# Patient Record
Sex: Female | Born: 1960 | Race: Black or African American | Hispanic: No | Marital: Married | Smoking: Current every day smoker
Health system: Southern US, Community
[De-identification: ages and names within clinical notes are randomized; demographics above are authoritative.]

## PROBLEM LIST (undated history)

## (undated) DIAGNOSIS — E039 Hypothyroidism, unspecified: Secondary | ICD-10-CM

## (undated) DIAGNOSIS — K219 Gastro-esophageal reflux disease without esophagitis: Secondary | ICD-10-CM

## (undated) DIAGNOSIS — T7840XA Allergy, unspecified, initial encounter: Secondary | ICD-10-CM

## (undated) DIAGNOSIS — I10 Essential (primary) hypertension: Secondary | ICD-10-CM

## (undated) DIAGNOSIS — E538 Deficiency of other specified B group vitamins: Secondary | ICD-10-CM

## (undated) DIAGNOSIS — F419 Anxiety disorder, unspecified: Secondary | ICD-10-CM

## (undated) DIAGNOSIS — F32A Depression, unspecified: Secondary | ICD-10-CM

## (undated) DIAGNOSIS — F329 Major depressive disorder, single episode, unspecified: Secondary | ICD-10-CM

## (undated) DIAGNOSIS — N289 Disorder of kidney and ureter, unspecified: Secondary | ICD-10-CM

## (undated) DIAGNOSIS — Z720 Tobacco use: Secondary | ICD-10-CM

## (undated) DIAGNOSIS — E785 Hyperlipidemia, unspecified: Secondary | ICD-10-CM

## (undated) DIAGNOSIS — IMO0002 Reserved for concepts with insufficient information to code with codable children: Secondary | ICD-10-CM

## (undated) DIAGNOSIS — E559 Vitamin D deficiency, unspecified: Secondary | ICD-10-CM

## (undated) HISTORY — DX: Allergy, unspecified, initial encounter: T78.40XA

## (undated) HISTORY — DX: Disorder of kidney and ureter, unspecified: N28.9

## (undated) HISTORY — DX: Gastro-esophageal reflux disease without esophagitis: K21.9

## (undated) HISTORY — DX: Deficiency of other specified B group vitamins: E53.8

## (undated) HISTORY — DX: Essential (primary) hypertension: I10

## (undated) HISTORY — DX: Tobacco use: Z72.0

## (undated) HISTORY — DX: Depression, unspecified: F32.A

## (undated) HISTORY — PX: EYE SURGERY: SHX253

## (undated) HISTORY — DX: Hypothyroidism, unspecified: E03.9

## (undated) HISTORY — DX: Hyperlipidemia, unspecified: E78.5

## (undated) HISTORY — DX: Vitamin D deficiency, unspecified: E55.9

## (undated) HISTORY — DX: Anxiety disorder, unspecified: F41.9

## (undated) HISTORY — DX: Reserved for concepts with insufficient information to code with codable children: IMO0002

## (undated) HISTORY — DX: Major depressive disorder, single episode, unspecified: F32.9

---

## 1999-05-03 ENCOUNTER — Other Ambulatory Visit: Admission: RE | Admit: 1999-05-03 | Discharge: 1999-05-03 | Payer: Self-pay | Admitting: Gynecology

## 1999-12-18 ENCOUNTER — Encounter: Payer: Self-pay | Admitting: Emergency Medicine

## 1999-12-18 ENCOUNTER — Emergency Department (HOSPITAL_COMMUNITY): Admission: EM | Admit: 1999-12-18 | Discharge: 1999-12-18 | Payer: Self-pay | Admitting: Emergency Medicine

## 2000-03-07 ENCOUNTER — Other Ambulatory Visit: Admission: RE | Admit: 2000-03-07 | Discharge: 2000-03-07 | Payer: Self-pay | Admitting: Internal Medicine

## 2001-04-12 ENCOUNTER — Other Ambulatory Visit: Admission: RE | Admit: 2001-04-12 | Discharge: 2001-04-12 | Payer: Self-pay | Admitting: Internal Medicine

## 2002-09-06 ENCOUNTER — Other Ambulatory Visit: Admission: RE | Admit: 2002-09-06 | Discharge: 2002-09-06 | Payer: Self-pay | Admitting: Gynecology

## 2004-01-15 ENCOUNTER — Other Ambulatory Visit: Admission: RE | Admit: 2004-01-15 | Discharge: 2004-01-15 | Payer: Self-pay | Admitting: Gynecology

## 2004-11-09 ENCOUNTER — Ambulatory Visit: Payer: Self-pay | Admitting: Internal Medicine

## 2005-01-17 ENCOUNTER — Other Ambulatory Visit: Admission: RE | Admit: 2005-01-17 | Discharge: 2005-01-17 | Payer: Self-pay | Admitting: Gynecology

## 2005-01-25 ENCOUNTER — Ambulatory Visit: Payer: Self-pay | Admitting: Internal Medicine

## 2005-03-14 ENCOUNTER — Ambulatory Visit: Payer: Self-pay | Admitting: Internal Medicine

## 2005-04-11 ENCOUNTER — Ambulatory Visit: Payer: Self-pay | Admitting: Internal Medicine

## 2005-04-15 ENCOUNTER — Ambulatory Visit: Payer: Self-pay | Admitting: Internal Medicine

## 2005-07-14 ENCOUNTER — Ambulatory Visit: Payer: Self-pay | Admitting: Internal Medicine

## 2005-08-17 ENCOUNTER — Ambulatory Visit: Payer: Self-pay | Admitting: Internal Medicine

## 2005-11-30 ENCOUNTER — Ambulatory Visit: Payer: Self-pay | Admitting: Internal Medicine

## 2006-01-20 ENCOUNTER — Ambulatory Visit (HOSPITAL_COMMUNITY): Admission: RE | Admit: 2006-01-20 | Discharge: 2006-01-20 | Payer: Self-pay | Admitting: *Deleted

## 2006-02-03 ENCOUNTER — Ambulatory Visit: Payer: Self-pay | Admitting: Endocrinology

## 2006-04-04 ENCOUNTER — Ambulatory Visit: Payer: Self-pay | Admitting: Internal Medicine

## 2006-04-07 ENCOUNTER — Ambulatory Visit: Payer: Self-pay | Admitting: Internal Medicine

## 2006-04-07 LAB — CONVERTED CEMR LAB
ALT: 24 units/L (ref 0–40)
AST: 20 units/L (ref 0–37)
Albumin: 3.3 g/dL — ABNORMAL LOW (ref 3.5–5.2)
Alkaline Phosphatase: 57 units/L (ref 39–117)
Amylase: 55 units/L (ref 27–131)
BUN: 11 mg/dL (ref 6–23)
Basophils Absolute: 0.1 10*3/uL (ref 0.0–0.1)
Basophils Relative: 0.9 % (ref 0.0–1.0)
CO2: 27 meq/L (ref 19–32)
Calcium: 9 mg/dL (ref 8.4–10.5)
Chloride: 114 meq/L — ABNORMAL HIGH (ref 96–112)
Creatinine, Ser: 0.8 mg/dL (ref 0.4–1.2)
Eosinophil percent: 5.5 % — ABNORMAL HIGH (ref 0.0–5.0)
GFR calc non Af Amer: 82 mL/min
Glomerular Filtration Rate, Af Am: 100 mL/min/{1.73_m2}
Glucose, Bld: 104 mg/dL — ABNORMAL HIGH (ref 70–99)
HCT: 37 % (ref 36.0–46.0)
Hemoglobin: 12.8 g/dL (ref 12.0–15.0)
Heterophile Ab Screen: NEGATIVE
Lymphocytes Relative: 26.1 % (ref 12.0–46.0)
MCHC: 34.5 g/dL (ref 30.0–36.0)
MCV: 88.2 fL (ref 78.0–100.0)
Monocytes Absolute: 0.9 10*3/uL — ABNORMAL HIGH (ref 0.2–0.7)
Monocytes Relative: 8.1 % (ref 3.0–11.0)
Neutro Abs: 6.8 10*3/uL (ref 1.4–7.7)
Neutrophils Relative %: 59.4 % (ref 43.0–77.0)
Platelets: 349 10*3/uL (ref 150–400)
Potassium: 3.4 meq/L — ABNORMAL LOW (ref 3.5–5.1)
RBC: 4.2 M/uL (ref 3.87–5.11)
RDW: 12.6 % (ref 11.5–14.6)
Sed Rate: 66 mm/hr — ABNORMAL HIGH (ref 0–25)
Sodium: 145 meq/L (ref 135–145)
Streptococcus, Group A Screen (Direct): NEGATIVE
Total Bilirubin: 0.7 mg/dL (ref 0.3–1.2)
Total Protein: 6.9 g/dL (ref 6.0–8.3)
WBC: 11.3 10*3/uL — ABNORMAL HIGH (ref 4.5–10.5)

## 2006-06-15 ENCOUNTER — Other Ambulatory Visit: Admission: RE | Admit: 2006-06-15 | Discharge: 2006-06-15 | Payer: Self-pay | Admitting: Gynecology

## 2006-07-21 ENCOUNTER — Ambulatory Visit: Payer: Self-pay | Admitting: Internal Medicine

## 2006-07-21 LAB — CONVERTED CEMR LAB
BUN: 15 mg/dL (ref 6–23)
Basophils Absolute: 0 10*3/uL (ref 0.0–0.1)
Basophils Relative: 0.2 % (ref 0.0–1.0)
CO2: 29 meq/L (ref 19–32)
Calcium: 9.4 mg/dL (ref 8.4–10.5)
Chloride: 105 meq/L (ref 96–112)
Creatinine, Ser: 0.8 mg/dL (ref 0.4–1.2)
Eosinophils Absolute: 0.7 10*3/uL — ABNORMAL HIGH (ref 0.0–0.6)
Eosinophils Relative: 5.4 % — ABNORMAL HIGH (ref 0.0–5.0)
GFR calc Af Amer: 99 mL/min
GFR calc non Af Amer: 82 mL/min
Glucose, Bld: 87 mg/dL (ref 70–99)
HCT: 37.8 % (ref 36.0–46.0)
Hemoglobin: 13.4 g/dL (ref 12.0–15.0)
Lymphocytes Relative: 24.2 % (ref 12.0–46.0)
MCHC: 35.4 g/dL (ref 30.0–36.0)
MCV: 86.3 fL (ref 78.0–100.0)
Monocytes Absolute: 0.6 10*3/uL (ref 0.2–0.7)
Monocytes Relative: 4 % (ref 3.0–11.0)
Neutro Abs: 9.2 10*3/uL — ABNORMAL HIGH (ref 1.4–7.7)
Neutrophils Relative %: 66.2 % (ref 43.0–77.0)
Platelets: 385 10*3/uL (ref 150–400)
Potassium: 4.1 meq/L (ref 3.5–5.1)
RBC: 4.38 M/uL (ref 3.87–5.11)
RDW: 13 % (ref 11.5–14.6)
Sodium: 141 meq/L (ref 135–145)
Uric Acid, Serum: 4.8 mg/dL (ref 2.4–7.0)
WBC: 13.8 10*3/uL — ABNORMAL HIGH (ref 4.5–10.5)

## 2006-10-27 DIAGNOSIS — E785 Hyperlipidemia, unspecified: Secondary | ICD-10-CM

## 2007-02-20 ENCOUNTER — Ambulatory Visit: Payer: Self-pay | Admitting: Pulmonary Disease

## 2007-02-21 ENCOUNTER — Ambulatory Visit: Payer: Self-pay | Admitting: Internal Medicine

## 2007-02-28 LAB — CONVERTED CEMR LAB
ALT: 22 units/L (ref 0–35)
AST: 23 units/L (ref 0–37)
Albumin: 3.5 g/dL (ref 3.5–5.2)
Alkaline Phosphatase: 60 units/L (ref 39–117)
BUN: 10 mg/dL (ref 6–23)
Basophils Absolute: 0.3 10*3/uL — ABNORMAL HIGH (ref 0.0–0.1)
Basophils Relative: 2.6 % — ABNORMAL HIGH (ref 0.0–1.0)
Bilirubin Urine: NEGATIVE
Bilirubin, Direct: 0.1 mg/dL (ref 0.0–0.3)
CO2: 27 meq/L (ref 19–32)
Calcium: 8.9 mg/dL (ref 8.4–10.5)
Chloride: 103 meq/L (ref 96–112)
Cholesterol: 280 mg/dL (ref 0–200)
Creatinine, Ser: 0.7 mg/dL (ref 0.4–1.2)
Direct LDL: 219 mg/dL
Eosinophils Absolute: 0.4 10*3/uL (ref 0.0–0.6)
Eosinophils Relative: 3.6 % (ref 0.0–5.0)
GFR calc Af Amer: 116 mL/min
GFR calc non Af Amer: 96 mL/min
Glucose, Bld: 103 mg/dL — ABNORMAL HIGH (ref 70–99)
HCT: 39.3 % (ref 36.0–46.0)
HDL: 29.7 mg/dL — ABNORMAL LOW (ref >39.0)
Hemoglobin: 13.8 g/dL (ref 12.0–15.0)
Ketones, ur: NEGATIVE mg/dL
Leukocytes, UA: NEGATIVE
Lymphocytes Relative: 24.1 % (ref 12.0–46.0)
MCHC: 35.1 g/dL (ref 30.0–36.0)
MCV: 88.1 fL (ref 78.0–100.0)
Monocytes Absolute: 0.7 10*3/uL (ref 0.2–0.7)
Monocytes Relative: 5.6 % (ref 3.0–11.0)
Neutro Abs: 7.6 10*3/uL (ref 1.4–7.7)
Neutrophils Relative %: 64.1 % (ref 43.0–77.0)
Nitrite: NEGATIVE
Platelets: 370 10*3/uL (ref 150–400)
Potassium: 3.9 meq/L (ref 3.5–5.1)
RBC: 4.46 M/uL (ref 3.87–5.11)
RDW: 13.2 % (ref 11.5–14.6)
Sodium: 138 meq/L (ref 135–145)
Specific Gravity, Urine: 1.025 (ref 1.000–1.03)
TSH: 9.65 microintl units/mL — ABNORMAL HIGH (ref 0.35–5.50)
Total Bilirubin: 0.9 mg/dL (ref 0.3–1.2)
Total CHOL/HDL Ratio: 9.4
Total Protein, Urine: NEGATIVE mg/dL
Total Protein: 6.9 g/dL (ref 6.0–8.3)
Triglycerides: 133 mg/dL (ref 0–149)
Urine Glucose: NEGATIVE mg/dL
Urobilinogen, UA: 0.2 (ref 0.0–1.0)
VLDL: 27 mg/dL (ref 0–40)
WBC: 11.9 10*3/uL — ABNORMAL HIGH (ref 4.5–10.5)
pH: 6 (ref 5.0–8.0)

## 2007-03-07 ENCOUNTER — Ambulatory Visit: Payer: Self-pay | Admitting: Internal Medicine

## 2007-03-07 DIAGNOSIS — E039 Hypothyroidism, unspecified: Secondary | ICD-10-CM | POA: Insufficient documentation

## 2007-03-07 DIAGNOSIS — J309 Allergic rhinitis, unspecified: Secondary | ICD-10-CM | POA: Insufficient documentation

## 2007-03-07 DIAGNOSIS — R635 Abnormal weight gain: Secondary | ICD-10-CM | POA: Insufficient documentation

## 2007-03-07 DIAGNOSIS — R7309 Other abnormal glucose: Secondary | ICD-10-CM | POA: Insufficient documentation

## 2007-03-15 ENCOUNTER — Telehealth: Payer: Self-pay | Admitting: Internal Medicine

## 2007-03-29 HISTORY — PX: GANGLION CYST EXCISION: SHX1691

## 2007-06-05 ENCOUNTER — Ambulatory Visit: Payer: Self-pay | Admitting: Internal Medicine

## 2007-06-05 LAB — CONVERTED CEMR LAB
BUN: 21 mg/dL (ref 6–23)
CO2: 28 meq/L (ref 19–32)
Calcium: 9.6 mg/dL (ref 8.4–10.5)
Chloride: 109 meq/L (ref 96–112)
Cholesterol: 289 mg/dL (ref 0–200)
Creatinine, Ser: 0.8 mg/dL (ref 0.4–1.2)
Direct LDL: 236.5 mg/dL
GFR calc Af Amer: 99 mL/min
GFR calc non Af Amer: 82 mL/min
Glucose, Bld: 108 mg/dL — ABNORMAL HIGH (ref 70–99)
HDL: 37.3 mg/dL — ABNORMAL LOW (ref >39.0)
Potassium: 4.3 meq/L (ref 3.5–5.1)
Sodium: 143 meq/L (ref 135–145)
TSH: 3.47 microintl units/mL (ref 0.35–5.50)
Total CHOL/HDL Ratio: 7.7
Triglycerides: 91 mg/dL (ref 0–149)
VLDL: 18 mg/dL (ref 0–40)

## 2007-06-08 ENCOUNTER — Ambulatory Visit: Payer: Self-pay | Admitting: Internal Medicine

## 2007-06-08 DIAGNOSIS — F17200 Nicotine dependence, unspecified, uncomplicated: Secondary | ICD-10-CM | POA: Insufficient documentation

## 2007-08-29 ENCOUNTER — Other Ambulatory Visit: Admission: RE | Admit: 2007-08-29 | Discharge: 2007-08-29 | Payer: Self-pay | Admitting: Gynecology

## 2008-02-05 ENCOUNTER — Ambulatory Visit: Payer: Self-pay | Admitting: Internal Medicine

## 2008-02-05 LAB — CONVERTED CEMR LAB
BUN: 14 mg/dL (ref 6–23)
CO2: 29 meq/L (ref 19–32)
Calcium: 9.4 mg/dL (ref 8.4–10.5)
Chloride: 107 meq/L (ref 96–112)
Cholesterol: 252 mg/dL (ref 0–200)
Creatinine, Ser: 0.7 mg/dL (ref 0.4–1.2)
Direct LDL: 189.4 mg/dL
GFR calc Af Amer: 115 mL/min
GFR calc non Af Amer: 95 mL/min
Glucose, Bld: 105 mg/dL — ABNORMAL HIGH (ref 70–99)
HDL: 28.5 mg/dL — ABNORMAL LOW (ref >39.0)
Potassium: 4.1 meq/L (ref 3.5–5.1)
Sodium: 143 meq/L (ref 135–145)
TSH: 5.04 microintl units/mL (ref 0.35–5.50)
Total CHOL/HDL Ratio: 8.8
Triglycerides: 151 mg/dL — ABNORMAL HIGH (ref 0–149)
VLDL: 30 mg/dL (ref 0–40)

## 2008-02-14 ENCOUNTER — Ambulatory Visit: Payer: Self-pay | Admitting: Internal Medicine

## 2008-02-14 DIAGNOSIS — R5381 Other malaise: Secondary | ICD-10-CM | POA: Insufficient documentation

## 2008-02-14 DIAGNOSIS — J029 Acute pharyngitis, unspecified: Secondary | ICD-10-CM | POA: Insufficient documentation

## 2008-02-14 DIAGNOSIS — R5383 Other fatigue: Secondary | ICD-10-CM

## 2008-02-14 DIAGNOSIS — R61 Generalized hyperhidrosis: Secondary | ICD-10-CM | POA: Insufficient documentation

## 2008-02-14 DIAGNOSIS — G4700 Insomnia, unspecified: Secondary | ICD-10-CM

## 2008-02-15 ENCOUNTER — Telehealth (INDEPENDENT_AMBULATORY_CARE_PROVIDER_SITE_OTHER): Payer: Self-pay | Admitting: *Deleted

## 2008-02-22 ENCOUNTER — Telehealth: Payer: Self-pay | Admitting: Internal Medicine

## 2008-02-25 ENCOUNTER — Encounter: Payer: Self-pay | Admitting: Internal Medicine

## 2008-06-03 ENCOUNTER — Ambulatory Visit: Payer: Self-pay | Admitting: Endocrinology

## 2008-06-03 ENCOUNTER — Telehealth (INDEPENDENT_AMBULATORY_CARE_PROVIDER_SITE_OTHER): Payer: Self-pay | Admitting: *Deleted

## 2008-06-03 DIAGNOSIS — J069 Acute upper respiratory infection, unspecified: Secondary | ICD-10-CM | POA: Insufficient documentation

## 2008-07-30 ENCOUNTER — Ambulatory Visit: Payer: Self-pay | Admitting: Internal Medicine

## 2008-08-01 LAB — CONVERTED CEMR LAB
ALT: 17 units/L (ref 0–35)
AST: 18 units/L (ref 0–37)
Albumin: 3.6 g/dL (ref 3.5–5.2)
Alkaline Phosphatase: 63 units/L (ref 39–117)
BUN: 16 mg/dL (ref 6–23)
Bilirubin, Direct: 0.2 mg/dL (ref 0.0–0.3)
CO2: 30 meq/L (ref 19–32)
Calcium: 8.9 mg/dL (ref 8.4–10.5)
Chloride: 110 meq/L (ref 96–112)
Cholesterol: 270 mg/dL — ABNORMAL HIGH (ref 0–200)
Creatinine, Ser: 0.7 mg/dL (ref 0.4–1.2)
Direct LDL: 214.1 mg/dL
GFR calc non Af Amer: 94.83 mL/min (ref >60)
Glucose, Bld: 93 mg/dL (ref 70–99)
HDL: 29.4 mg/dL — ABNORMAL LOW (ref >39.00)
Potassium: 3.9 meq/L (ref 3.5–5.1)
Sodium: 145 meq/L (ref 135–145)
TSH: 4.42 microintl units/mL (ref 0.35–5.50)
Total Bilirubin: 0.6 mg/dL (ref 0.3–1.2)
Total CHOL/HDL Ratio: 9
Total Protein: 7.2 g/dL (ref 6.0–8.3)
Triglycerides: 107 mg/dL (ref 0.0–149.0)
VLDL: 21.4 mg/dL (ref 0.0–40.0)

## 2008-08-11 ENCOUNTER — Ambulatory Visit: Payer: Self-pay | Admitting: Internal Medicine

## 2008-08-11 DIAGNOSIS — F329 Major depressive disorder, single episode, unspecified: Secondary | ICD-10-CM | POA: Insufficient documentation

## 2008-08-11 DIAGNOSIS — F419 Anxiety disorder, unspecified: Secondary | ICD-10-CM | POA: Insufficient documentation

## 2008-08-11 DIAGNOSIS — F411 Generalized anxiety disorder: Secondary | ICD-10-CM | POA: Insufficient documentation

## 2008-09-09 ENCOUNTER — Telehealth: Payer: Self-pay | Admitting: Internal Medicine

## 2008-09-18 ENCOUNTER — Telehealth: Payer: Self-pay | Admitting: Internal Medicine

## 2008-10-09 ENCOUNTER — Ambulatory Visit: Payer: Self-pay | Admitting: Psychology

## 2008-10-16 ENCOUNTER — Other Ambulatory Visit: Admission: RE | Admit: 2008-10-16 | Discharge: 2008-10-16 | Payer: Self-pay | Admitting: Gynecology

## 2008-10-16 ENCOUNTER — Ambulatory Visit: Payer: Self-pay | Admitting: Gynecology

## 2008-10-16 ENCOUNTER — Encounter: Payer: Self-pay | Admitting: Gynecology

## 2008-10-27 ENCOUNTER — Ambulatory Visit: Payer: Self-pay | Admitting: Gynecology

## 2008-12-04 ENCOUNTER — Ambulatory Visit: Payer: Self-pay | Admitting: Women's Health

## 2009-02-05 ENCOUNTER — Ambulatory Visit: Payer: Self-pay | Admitting: Internal Medicine

## 2009-02-09 LAB — CONVERTED CEMR LAB
ALT: 19 units/L (ref 0–35)
AST: 19 units/L (ref 0–37)
Albumin: 3.7 g/dL (ref 3.5–5.2)
Alkaline Phosphatase: 63 units/L (ref 39–117)
BUN: 16 mg/dL (ref 6–23)
Bilirubin, Direct: 0.1 mg/dL (ref 0.0–0.3)
CO2: 26 meq/L (ref 19–32)
Calcium: 9.1 mg/dL (ref 8.4–10.5)
Chloride: 108 meq/L (ref 96–112)
Cholesterol: 281 mg/dL — ABNORMAL HIGH (ref 0–200)
Creatinine, Ser: 0.8 mg/dL (ref 0.4–1.2)
Direct LDL: 236.7 mg/dL
GFR calc non Af Amer: 81.11 mL/min (ref >60)
Glucose, Bld: 97 mg/dL (ref 70–99)
HDL: 33.5 mg/dL — ABNORMAL LOW (ref >39.00)
Potassium: 4.1 meq/L (ref 3.5–5.1)
Sodium: 142 meq/L (ref 135–145)
TSH: 2.47 microintl units/mL (ref 0.35–5.50)
Total Bilirubin: 0.6 mg/dL (ref 0.3–1.2)
Total CHOL/HDL Ratio: 8
Total Protein: 7.1 g/dL (ref 6.0–8.3)
Triglycerides: 90 mg/dL (ref 0.0–149.0)
VLDL: 18 mg/dL (ref 0.0–40.0)
Vitamin B-12: 241 pg/mL (ref 211–911)

## 2009-02-10 ENCOUNTER — Ambulatory Visit: Payer: Self-pay | Admitting: Internal Medicine

## 2009-02-10 DIAGNOSIS — E538 Deficiency of other specified B group vitamins: Secondary | ICD-10-CM | POA: Insufficient documentation

## 2009-04-08 ENCOUNTER — Telehealth: Payer: Self-pay | Admitting: Internal Medicine

## 2009-08-28 ENCOUNTER — Ambulatory Visit: Payer: Self-pay | Admitting: Internal Medicine

## 2009-08-28 DIAGNOSIS — J0140 Acute pansinusitis, unspecified: Secondary | ICD-10-CM | POA: Insufficient documentation

## 2009-08-28 DIAGNOSIS — J019 Acute sinusitis, unspecified: Secondary | ICD-10-CM | POA: Insufficient documentation

## 2009-09-16 ENCOUNTER — Telehealth: Payer: Self-pay | Admitting: Internal Medicine

## 2009-10-16 ENCOUNTER — Ambulatory Visit: Payer: Self-pay | Admitting: Internal Medicine

## 2009-10-17 LAB — CONVERTED CEMR LAB
Albumin: 4 g/dL (ref 3.5–5.2)
Alkaline Phosphatase: 58 units/L (ref 39–117)
BUN: 15 mg/dL (ref 6–23)
Chloride: 106 meq/L (ref 96–112)
Cholesterol: 302 mg/dL — ABNORMAL HIGH (ref 0–200)
Potassium: 4.3 meq/L (ref 3.5–5.1)
Total CHOL/HDL Ratio: 7
VLDL: 25.4 mg/dL (ref 0.0–40.0)
Vitamin B-12: 261 pg/mL (ref 211–911)

## 2009-10-19 LAB — CONVERTED CEMR LAB
Ketones, ur: NEGATIVE mg/dL
Leukocytes, UA: NEGATIVE
Specific Gravity, Urine: 1.025 (ref 1.000–1.030)
Total Protein, Urine: NEGATIVE mg/dL
Urine Glucose: NEGATIVE mg/dL
pH: 6 (ref 5.0–8.0)

## 2009-10-21 ENCOUNTER — Ambulatory Visit: Payer: Self-pay | Admitting: Internal Medicine

## 2009-11-03 ENCOUNTER — Telehealth: Payer: Self-pay | Admitting: Internal Medicine

## 2009-11-04 ENCOUNTER — Telehealth: Payer: Self-pay | Admitting: Internal Medicine

## 2009-11-09 ENCOUNTER — Ambulatory Visit: Payer: Self-pay | Admitting: Internal Medicine

## 2009-11-10 LAB — CONVERTED CEMR LAB: TSH: 1.83 microintl units/mL (ref 0.35–5.50)

## 2009-12-10 ENCOUNTER — Telehealth: Payer: Self-pay | Admitting: Internal Medicine

## 2009-12-15 ENCOUNTER — Telehealth: Payer: Self-pay | Admitting: Internal Medicine

## 2010-01-14 ENCOUNTER — Ambulatory Visit: Payer: Self-pay | Admitting: Internal Medicine

## 2010-01-21 ENCOUNTER — Telehealth: Payer: Self-pay | Admitting: Internal Medicine

## 2010-02-02 ENCOUNTER — Ambulatory Visit: Payer: Self-pay | Admitting: Endocrinology

## 2010-02-02 ENCOUNTER — Encounter (INDEPENDENT_AMBULATORY_CARE_PROVIDER_SITE_OTHER): Payer: Self-pay | Admitting: *Deleted

## 2010-02-02 DIAGNOSIS — L408 Other psoriasis: Secondary | ICD-10-CM

## 2010-02-02 LAB — CONVERTED CEMR LAB: TSH: 2.35 microintl units/mL (ref 0.35–5.50)

## 2010-02-03 ENCOUNTER — Encounter: Payer: Self-pay | Admitting: Endocrinology

## 2010-02-08 ENCOUNTER — Telehealth: Payer: Self-pay | Admitting: Endocrinology

## 2010-04-12 ENCOUNTER — Ambulatory Visit
Admission: RE | Admit: 2010-04-12 | Discharge: 2010-04-12 | Payer: Self-pay | Source: Home / Self Care | Attending: Internal Medicine | Admitting: Internal Medicine

## 2010-04-20 ENCOUNTER — Ambulatory Visit: Admit: 2010-04-20 | Payer: Self-pay | Admitting: Internal Medicine

## 2010-04-27 ENCOUNTER — Ambulatory Visit: Admit: 2010-04-27 | Payer: Self-pay | Admitting: Internal Medicine

## 2010-04-29 ENCOUNTER — Encounter (INDEPENDENT_AMBULATORY_CARE_PROVIDER_SITE_OTHER): Payer: BC Managed Care – PPO | Admitting: Gynecology

## 2010-04-29 ENCOUNTER — Other Ambulatory Visit (HOSPITAL_COMMUNITY)
Admission: RE | Admit: 2010-04-29 | Discharge: 2010-04-29 | Disposition: A | Discharge status: Home / Self Care | Payer: BC Managed Care – PPO | Source: Ambulatory Visit | Attending: Gynecology | Admitting: Gynecology

## 2010-04-29 ENCOUNTER — Other Ambulatory Visit: Payer: Self-pay | Admitting: Gynecology

## 2010-04-29 ENCOUNTER — Encounter: Payer: Self-pay | Admitting: Gynecology

## 2010-04-29 DIAGNOSIS — Z124 Encounter for screening for malignant neoplasm of cervix: Secondary | ICD-10-CM | POA: Insufficient documentation

## 2010-04-29 DIAGNOSIS — Z01419 Encounter for gynecological examination (general) (routine) without abnormal findings: Secondary | ICD-10-CM

## 2010-04-29 NOTE — Assessment & Plan Note (Signed)
Summary: NEW ENDO CONSULT/ THYROID / NWS  #   Vital Signs:  Patient profile:   50 year old female Height:      62 inches (157.48 cm) Weight:      175.38 pounds (79.72 kg) BMI:     32.19 O2 Sat:      97 % on Room air Temp:     98.0 degrees F (36.67 degrees C) oral Pulse rate:   72 / minute Pulse rhythm:   regular BP sitting:   122 / 80  (left arm) Cuff size:   regular  Vitals Entered By: Brenton Grills CMA Duncan Dull) (February 02, 2010 1:19 PM)  O2 Flow:  Room air CC: New Endo/Thyroid/Dr. Plotnikov/aj Is Patient Diabetic? No Comments Pt is not currently taking Citalopram Hydrobromide, Lortadine, Sudafed or Triamcinolone cream   CC:  New Endo/Thyroid/Dr. Plotnikov/aj.  History of Present Illness: pt was found to have elevated tsh in 2008.   she was rx'ed synthroid at 25 micrograms/day, which was then increased to 50 micrograms/day, in 2009.   she reports few mos of slight rash at the extensor aspect of the right knee, and assoc itching.    Current Medications (verified): 1)  Aspir-Low 81 Mg Tbec (Aspirin) .Marland Kitchen.. 1 Once Daily Pc 2)  Vitamin D3 1000 Unit  Tabs (Cholecalciferol) .Marland Kitchen.. 1 Qd 3)  Synthroid 50 Mcg Tabs (Levothyroxine Sodium) .Marland Kitchen.. 1 By Mouth Qd 4)  Alprazolam 0.25 Mg Tabs (Alprazolam) .Marland Kitchen.. 1 By Mouth Two Times A Day As Needed Anxiety 5)  Citalopram Hydrobromide 10 Mg  Tabs (Citalopram Hydrobromide) .... Take 1 Tab By Mouth Daily 6)  Crestor 20 Mg Tabs (Rosuvastatin Calcium) .Marland Kitchen.. 1 Tablet By Mouth Daily 7)  Loratadine 10 Mg Tabs (Loratadine) .Marland Kitchen.. 1 By Mouth Once Daily As Needed Allergies 8)  Sudafed 12 Hour 120 Mg Xr12h-Tab (Pseudoephedrine Hcl) .Marland Kitchen.. 1 By Mouth Two Times A Day As Needed Allergies 9)  Triamcinolone Acetonide 0.5 % Crea (Triamcinolone Acetonide) .... Use Two Times A Day Prn 10)  Vitamin B-12 500 Mcg Tabs (Cyanocobalamin) .Marland Kitchen.. 1 By Mouth Once Daily For Vitamin B12 Deficiency  Allergies (verified): 1)  ! Lipitor (Atorvastatin Calcium) 2)  ! Vytorin  (Ezetimibe-Simvastatin)  Past History:  Past Medical History: Last updated: 02/10/2009 Hyperlipidemia gangeous cysts left hand tobacco family hx cad Allergic rhinitis R ganglion cyst  Dr Ralene Cork Dr Lily Peer  gyn Hypothyroidism  2008 Anxiety Depression Low B12  Review of Systems       denies depression, numbness, galactorrhea, muscle weakness, fever, dysuria, sob, blurry vision, and chest pain.  no menses x 3 years.   she reports fatigue, cold intolerance, difficulty losing weight, polyuria, easy bruising, rhinorrhea, myalgias, and slight headache.   Physical Exam  General:  obese.  no distress  Head:  head: no deformity eyes: no periorbital swelling, no proptosis external nose and ears are normal mouth: no lesion seen Neck:  Supple without thyroid enlargement or tenderness.  Lungs:  Clear to auscultation bilaterally. Normal respiratory effort.  Heart:  Regular rate and rhythm without murmurs or gallops noted. Normal S1,S2.   Msk:  muscle bulk and strength are grossly normal.  no obvious joint swelling.  gait is normal and steady  Pulses:  dorsalis pedis intact bilat.   Extremities:  no deformity.  no ulcer on the feet.  feet are of normal color and temp.  no edema  Neurologic:  cn 2-12 grossly intact.   readily moves all 4's.   sensation is intact to touch  on the feet. Skin:  there is a 3 cm psoriatic plaque at the anterior aspect of the right knee Cervical Nodes:  No significant adenopathy.  Psych:  Alert and cooperative; normal mood and affect; normal attention span and concentration.   Additional Exam:  FastTSH                   2.35 uIU/mL                 0.35-5.50 Free T4                   0.84 ng/dL                  0.60-1.60    Impression & Recommendations:  Problem # 1:  HYPOTHYROIDISM (ICD-244.9) well-replaced  Problem # 2:  PSORIASIS (ICD-696.1) Assessment: New common etiology to #1, and vit-b-12 deficiency  Problem # 3:  FATIGUE (ICD-780.79) and  other sxs, not thyroid-relaed  Other Orders: T-AMA (62952) Dermatology Referral (Derma) Admin of Therapeutic Inj  intramuscular or subcutaneous (84132) Vit B12 1000 mcg (J3420) TLB-TSH (Thyroid Stimulating Hormone) (84443-TSH) TLB-T4 (Thyrox), Free (44010-UV2Z) Consultation Level IV (36644)  Patient Instructions: 1)  refer dermatology.  you will be called with a day and time for an appointment. 2)  blood tests are being ordered for you today.  please call [phone] to hear your test results. 3)  (update: i left message on phone-tree:  same rx).   Medication Administration  Injection # 1:    Medication: Vit B12 1000 mcg    Diagnosis: VITAMIN B12 DEFICIENCY (ICD-266.2)    Route: IM    Site: L deltoid    Exp Date: 06/2011    Lot #: [id number]    Mfr: American Regent    Patient tolerated injection without complications    Given by: Brenton Grills CMA Duncan Dull) (February 02, 2010 2:16 PM)  Orders Added: 1)  T-AMA [95638] 2)  Dermatology Referral [Derma] 3)  Admin of Therapeutic Inj  intramuscular or subcutaneous [96372] 4)  Vit B12 1000 mcg [J3420] 5)  TLB-TSH (Thyroid Stimulating Hormone) [84443-TSH] 6)  TLB-T4 (Thyrox), Free [75643-PI9J] 7)  Consultation Level IV [18841]

## 2010-04-29 NOTE — Assessment & Plan Note (Signed)
Summary: ?SINUS INF/CD   Vital Signs:  Patient profile:   50 year old female Height:      62 inches Weight:      170.50 pounds BMI:     31.30 O2 Sat:      94 % on Room air Temp:     97.6 degrees F oral Pulse rate:   89 / minute BP sitting:   114 / 80  (left arm) Cuff size:   regular  Vitals Entered By: Lucious Groves (August 28, 2009 2:35 PM)  O2 Flow:  Room air CC: C/O sinus infection x3 days./kb Is Patient Diabetic? No Pain Assessment Patient in pain? no      Comments Patient notes that she has been having HA, cough, and some mucous production. Patient denies fever. Patient also notes that Sudafed and Mucinex have not helped./kb  Per patient she is not taking Zyrtec, zolpidem, citalopram, or crestor./kb   CC:  C/O sinus infection x3 days./kb.  History of Present Illness: The patient presents with complaints of sore throat, fever, sinus congestion and drainge of several days duration. Not better with OTC meds. Chest hurts with coughing. Can't sleep due to cough. Muscle aches are present.  The mucus is colored.   Current Medications (verified): 1)  Zyrtec Allergy 10 Mg  Tabs (Cetirizine Hcl) .... Q Hs 2)  Aspir-Low 81 Mg Tbec (Aspirin) .Marland Kitchen.. 1 Once Daily Pc 3)  Vitamin D3 1000 Unit  Tabs (Cholecalciferol) .Marland Kitchen.. 1 Qd 4)  Lovaza 1 Gm  Caps (Omega-3-Acid Ethyl Esters) .... 2 Caps Bid 5)  Zolpidem Tartrate 10 Mg  Tabs (Zolpidem Tartrate) .... 1/2 or 1 By Mouth At [facility] Prn 6)  Synthroid 50 Mcg Tabs (Levothyroxine Sodium) .Marland Kitchen.. 1 By Mouth Qd 7)  Alprazolam 0.25 Mg Tabs (Alprazolam) .Marland Kitchen.. 1 By Mouth Two Times A Day As Needed Anxiety 8)  Citalopram Hydrobromide 10 Mg  Tabs (Citalopram Hydrobromide) .... Take 1 Tab By Mouth Daily 9)  Crestor 20 Mg Tabs (Rosuvastatin Calcium) .Marland Kitchen.. 1 Tablet By Mouth Daily  Allergies (verified): 1)  ! Lipitor (Atorvastatin Calcium) 2)  ! Vytorin (Ezetimibe-Simvastatin)  Physical Exam  General:  NAD  Mouth:  Erythematous throat mucosa and intranasal  erythema.  Lungs:  clear except for few scattered wheezes Heart:  Normal rate and regular rhythm. S1 and S2 normal without gallop, murmur, click, rub or other extra sounds. Abdomen:  Bowel sounds positive,abdomen soft and non-tender without masses, organomegaly or hernias noted.   Impression & Recommendations:  Problem # 1:  SINUSITIS, ACUTE (ICD-461.9) Assessment New  The following medications were removed from the medication list:    Zithromax Z-pak 250 Mg Tabs (Azithromycin) .Marland Kitchen... Take 1 once Her updated medication list for this problem includes:    Zithromax Z-pak 250 Mg Tabs (Azithromycin) .Marland Kitchen... As dirrected    Sudafed 12 Hour 120 Mg Xr12h-tab (Pseudoephedrine hcl) .Marland Kitchen... 1 by mouth two times a day as needed allergies  Complete Medication List: 1)  Aspir-low 81 Mg Tbec (Aspirin) .Marland Kitchen.. 1 once daily pc 2)  Vitamin D3 1000 Unit Tabs (Cholecalciferol) .Marland Kitchen.. 1 qd 3)  Lovaza 1 Gm Caps (Omega-3-acid ethyl esters) .... 2 caps bid 4)  Zolpidem Tartrate 10 Mg Tabs (Zolpidem tartrate) .... 1/2 or 1 by mouth at hs prn 5)  Synthroid 50 Mcg Tabs (Levothyroxine sodium) .Marland Kitchen.. 1 by mouth qd 6)  Alprazolam 0.25 Mg Tabs (Alprazolam) .Marland Kitchen.. 1 by mouth two times a day as needed anxiety 7)  Citalopram Hydrobromide 10 Mg Tabs (  Citalopram hydrobromide) .... Take 1 tab by mouth daily 8)  Crestor 20 Mg Tabs (Rosuvastatin calcium) .Marland Kitchen.. 1 tablet by mouth daily 9)  Zithromax Z-pak 250 Mg Tabs (Azithromycin) .... As dirrected 10)  Loratadine 10 Mg Tabs (Loratadine) .Marland Kitchen.. 1 by mouth once daily as needed allergies 11)  Sudafed 12 Hour 120 Mg Xr12h-tab (Pseudoephedrine hcl) .Marland Kitchen.. 1 by mouth two times a day as needed allergies  Patient Instructions: 1)  Use over-the-counter medicines for "cold": Tylenol  650mg  or Advil 400mg  every 6 hours  for fever; Delsym or Robutussin for cough. Mucinex or Mucinex D for congestion. Ricola or Halls for sore throat. Office visit if not better or if worse. 2)  Use the Sinus rinse as  needed Prescriptions: ZITHROMAX Z-PAK 250 MG TABS (AZITHROMYCIN) as dirrected  #1 x 0   Entered and Authorized by:   Tresa Garter MD   Signed by:   Tresa Garter MD on 08/28/2009   Method used:   Electronically to        [facility] [address] [facility] [address] [phone]* (retail)       [address]       [place], [place]  [address]       Ph: [phone] or [phone]       Fax: [phone]   RxID:   [id number] LORATADINE 10 MG TABS (LORATADINE) 1 by mouth once daily as needed allergies  #30 x 6   Entered and Authorized by:   Tresa Garter MD   Signed by:   Tresa Garter MD on 08/28/2009   Method used:   [facility]        [facility] [facility] [address] [phone]* (retail)       [address]       [place], [place]  [address]       Ph: [phone] or [phone]       Fax: [phone]   RxID:   [id number] SUDAFED 12 HOUR 120 MG XR12H-TAB (PSEUDOEPHEDRINE HCL) 1 by mouth two times a day as needed allergies  #60 x 1   Entered and Authorized by:   Tresa Garter MD   Signed by:   Tresa Garter MD on 08/28/2009   Method used:   [facility]        [facility]  [address] [phone]* (retail)       [address]       [place], [place]  [id number]       Ph: [phone] or [phone]       Fax: [phone]   RxID:   [id number] ZITHROMAX Z-PAK 250 MG TABS (AZITHROMYCIN) as dirrected  #1 x 0   Entered and Authorized by:   Tresa Garter MD   Signed by:   Tresa Garter MD on 08/28/2009   Method used:   Print then Give to Patient   RxID:   [id number] LORATADINE 10 MG TABS (LORATADINE) 1 by mouth once daily as needed allergies  #30 x 6   Entered and Authorized by:   Tresa Garter MD   Signed by:   Tresa Garter MD on 08/28/2009   Method used:   Print then Give to Patient   RxID:   [id number] SUDAFED 12 HOUR 120 MG XR12H-TAB (PSEUDOEPHEDRINE  HCL) 1 by mouth two times a day  as needed allergies  #60 x 1   Entered and Authorized by:   Tresa Garter MD   Signed by:   Tresa Garter MD on 08/28/2009   Method used:   Print then Give to Patient   RxID:   [id number] SUDAFED 12 HOUR 120 MG XR12H-TAB (PSEUDOEPHEDRINE HCL) 1 by mouth two times a day as needed allergies  #60 x 1   Entered and Authorized by:   Tresa Garter MD   Signed by:   Tresa Garter MD on 08/28/2009   Method used:   Print then Give to Patient   RxID:   [id number] LORATADINE 10 MG TABS (LORATADINE) 1 by mouth once daily as needed allergies  #30 x 6   Entered and Authorized by:   Tresa Garter MD   Signed by:   Tresa Garter MD on 08/28/2009   Method used:   Print then Give to Patient   RxID:   [id number] VZDGLOVFI Z-PAK 250 MG TABS (AZITHROMYCIN) as dirrected  #1 x 0   Entered and Authorized by:   Tresa Garter MD   Signed by:   Tresa Garter MD on 08/28/2009   Method used:   Print then Give to Patient   RxID:   [id number]

## 2010-04-29 NOTE — Assessment & Plan Note (Signed)
Summary: b12 shot/plot/appt at 1pm/cd   Nurse Visit   Allergies: 1)  ! Lipitor (Atorvastatin Calcium) 2)  ! Vytorin (Ezetimibe-Simvastatin)  Medication Administration  Injection # 1:    Medication: Vit B12 1000 mcg    Diagnosis: VITAMIN B12 DEFICIENCY (ICD-266.2)    Route: IM    Site: L deltoid    Exp Date: 02/26/2011    Lot #: [id number]    Mfr: American Regent    Patient tolerated injection without complications    Given by: Margaret Pyle, CMA (April 12, 2010 1:08 PM)  Orders Added: 1)  Admin of Therapeutic Inj  intramuscular or subcutaneous [96372] 2)  Vit B12 1000 mcg [J3420]

## 2010-04-29 NOTE — Progress Notes (Signed)
Summary: CALL  Phone Note Call from Patient Call back at Work Phone [phone] Call back at [phone]   Summary of Call: Patient is requesting a call regarding a sinus infection.  Initial call taken by: Lamar Sprinkles, CMA,  April 08, 2009 5:25 PM  Follow-up for Phone Call        Pt c/o sinus congestion & pressure with yellow mucus x 3 days. She is taking mucinex with no relief. No fever, body aches, chills or cough. Patient is requesting rx for zpak, she is unable to come into the office due to her work schedule.  Follow-up by: Lamar Sprinkles, CMA,  April 09, 2009 8:47 AM  Additional Follow-up for Phone Call Additional follow up Details #1::        OK Zpac OV if sick Additional Follow-up by: Tresa Garter MD,  April 09, 2009 1:11 PM    Additional Follow-up for Phone Call Additional follow up Details #2::    Pt informed  Follow-up by: Lamar Sprinkles, CMA,  April 09, 2009 1:57 PM  New/Updated Medications: ZITHROMAX Z-PAK 250 MG TABS (AZITHROMYCIN) as dirrected Prescriptions: ZITHROMAX Z-PAK 250 MG TABS (AZITHROMYCIN) as dirrected  #1 x 0   Entered by:   Lamar Sprinkles, CMA   Authorized by:   Tresa Garter MD   Signed by:   Lamar Sprinkles, CMA on 04/09/2009   Method used:   [facility]        [facility]  [address] [phone]* (retail)       [address]       [place], [place]  [address]       Ph: [phone] or [phone]       Fax: [phone]   RxID:   [id number]

## 2010-04-29 NOTE — Progress Notes (Signed)
Summary: ?  Phone Note Call from Patient Call back at Work Phone [phone] Call back at [phone]   Reason for Call: Referral Summary of Call: Pt left vm that she had a question about her thyroid medication. left mess to call office back. Initial call taken by: Lamar Sprinkles, CMA,  December 10, 2009 10:44 AM  Follow-up for Phone Call        Pt would like to try taking HCG otc to help with weight loss. Will this interact with synthroid?  Follow-up by: Lamar Sprinkles, CMA,  December 10, 2009 10:49 AM  Additional Follow-up for Phone Call Additional follow up Details #1::        What is HCG? Additional Follow-up by: Tresa Garter MD,  December 10, 2009 12:50 PM    Additional Follow-up for Phone Call Additional follow up Details #2::    OTC supplement to help w/weight lost. Here is link from FDA website.  [employer] ...........Marland KitchenLamar Sprinkles, CMA  December 10, 2009 2:48 PM  Maralyn Sago, this is a pregnancy test page. I would not rec. to use HCG for wt loss. Thyroid med should not be a problem. [employer] Tresa Garter MD  December 11, 2009 1:22 PM   Additional Follow-up for Phone Call Additional follow up Details #3:: Details for Additional Follow-up Action Taken: left mess for pt to call back .Marland KitchenMarland KitchenMarland KitchenLanier Prude, [facility])  December 11, 2009 2:21 PM   Pt informed .................Marland KitchenLamar Sprinkles, CMA  December 14, 2009 5:30 PM

## 2010-04-29 NOTE — Progress Notes (Signed)
Summary: REFERRAL  Phone Note Call from Patient Call back at Work Phone [phone]   Summary of Call: Patient is requesting referral to Dr Everardo All for consult regarding her thyroid.  Initial call taken by: Lamar Sprinkles, CMA,  January 21, 2010 11:26 AM  Follow-up for Phone Call        ok Follow-up by: Tresa Garter MD,  January 21, 2010 1:27 PM

## 2010-04-29 NOTE — Letter (Signed)
Summary: Out of Work  [facility]  [address] [place], [place] [address]   Phone: [phone]  Fax: [phone]    February 02, 2010   Employee:  ARRAYAH CONNORS Rivet    To Whom It May Concern:   For Medical reasons, please excuse the above named employee from work for the following dates:  Start:   February 02, 2010  End:   Novemver 9, 2011  If you need additional information, please feel free to contact our office.         Sincerely,    Romero Belling, MD

## 2010-04-29 NOTE — Progress Notes (Signed)
Summary: LAB ORDER  Phone Note Call from Patient Call back at Work Phone [phone]   Summary of Call: Patient is requesting lab order for TSH, it was not checked w/last labs.  Initial call taken by: Lamar Sprinkles, CMA,  November 03, 2009 9:57 AM  Follow-up for Phone Call        ok TSH 780.79 Follow-up by: Tresa Garter MD,  November 03, 2009 5:43 PM  Additional Follow-up for Phone Call Additional follow up Details #1::        left detailed vm on hm # Additional Follow-up by: Lamar Sprinkles, CMA,  November 03, 2009 5:49 PM

## 2010-04-29 NOTE — Progress Notes (Signed)
Summary: Thyroid testing  Phone Note Call from Patient Call back at Work Phone [phone]   Summary of Call: Patient is aware that she needs her thyroid checked and is wondering if this is a fasting lab and does she take her thyroid med prior. Please advise. Initial call taken by: Lucious Groves CMA,  November 04, 2009 8:54 AM  Follow-up for Phone Call        ok fastng ok to take med Follow-up by: Tresa Garter MD,  November 04, 2009 1:22 PM  Additional Follow-up for Phone Call Additional follow up Details #1::        Left detailed vm on hm # Additional Follow-up by: Lamar Sprinkles, CMA,  November 04, 2009 2:18 PM

## 2010-04-29 NOTE — Assessment & Plan Note (Signed)
Summary: PNEUMONIA SHOT/PT IN AT 11:15/LB   Nurse Visit   Allergies: 1)  ! Lipitor (Atorvastatin Calcium) 2)  ! Vytorin (Ezetimibe-Simvastatin)  Immunization History:  Influenza Immunization History:    Influenza:  historical (12/28/2009)  Immunizations Administered:  Pneumonia Vaccine:    Vaccine Type: Pneumovax    Site: right deltoid    Mfr: Merck    Dose: 0.5 ml    Route: IM    Given by: Lamar Sprinkles, CMA    Exp. Date: 06/14/2011    Lot #: [id number]    VIS given: 03/02/09 version given January 14, 2010.  Orders Added: 1)  Pneumococcal Vaccine [90732] 2)  Admin 1st Vaccine [82956]

## 2010-04-29 NOTE — Assessment & Plan Note (Signed)
Summary: f/u appt/#/cd   RS'D PER PT/NWS   Vital Signs:  Patient profile:   50 year old female Height:      62 inches Weight:      174 pounds BMI:     31.94 Temp:     98.4 degrees F oral Pulse rate:   72 / minute Pulse rhythm:   regular Resp:     16 per minute BP sitting:   120 / 90  (left arm) Cuff size:   regular  Vitals Entered By: [name] Prude, CMA(AAMA) (October 21, 2009 9:11 AM) CC: f/u Is Patient Diabetic? No Comments pt is not taking Lovaza or Zolpidem   CC:  f/u.  History of Present Illness: The patient presents for a follow up of B12 def, dyslipidemia, anxiety, depression . Not taking meds lately: she was stressed out...   Current Medications (verified): 1)  Aspir-Low 81 Mg Tbec (Aspirin) .Marland Kitchen.. 1 Once Daily Pc 2)  Vitamin D3 1000 Unit  Tabs (Cholecalciferol) .Marland Kitchen.. 1 Qd 3)  Lovaza 1 Gm  Caps (Omega-3-Acid Ethyl Esters) .... 2 Caps Bid 4)  Zolpidem Tartrate 10 Mg  Tabs (Zolpidem Tartrate) .... 1/2 or 1 By Mouth At [facility] Prn 5)  Synthroid 50 Mcg Tabs (Levothyroxine Sodium) .Marland Kitchen.. 1 By Mouth Qd 6)  Alprazolam 0.25 Mg Tabs (Alprazolam) .Marland Kitchen.. 1 By Mouth Two Times A Day As Needed Anxiety 7)  Citalopram Hydrobromide 10 Mg  Tabs (Citalopram Hydrobromide) .... Take 1 Tab By Mouth Daily 8)  Crestor 20 Mg Tabs (Rosuvastatin Calcium) .Marland Kitchen.. 1 Tablet By Mouth Daily 9)  Loratadine 10 Mg Tabs (Loratadine) .Marland Kitchen.. 1 By Mouth Once Daily As Needed Allergies 10)  Sudafed 12 Hour 120 Mg Xr12h-Tab (Pseudoephedrine Hcl) .Marland Kitchen.. 1 By Mouth Two Times A Day As Needed Allergies  Allergies (verified): 1)  ! Lipitor (Atorvastatin Calcium) 2)  ! Vytorin (Ezetimibe-Simvastatin)  Past History:  Past Medical History: Last updated: 02/10/2009 Hyperlipidemia gangeous cysts left hand tobacco family hx cad Allergic rhinitis R ganglion cyst  Dr Ralene Cork Dr Lily Peer  gyn Hypothyroidism  2008 Anxiety Depression Low B12  Social History: Last updated: 08/11/2008 Occupation: office Married - separated  2010 Current Smoker  Review of Systems       The patient complains of weight gain and depression.  The patient denies chest pain, syncope, dyspnea on exertion, and melena.    Physical Exam  General:  NAD  Nose:  External nasal examination shows no deformity or inflammation. Nasal mucosa are pink and moist without lesions or exudates. Mouth:  Erythematous throat mucosa and intranasal erythema.  Lungs:  clear except for few scattered wheezes Heart:  Normal rate and regular rhythm. S1 and S2 normal without gallop, murmur, click, rub or other extra sounds. Abdomen:  Bowel sounds positive,abdomen soft and non-tender without masses, organomegaly or hernias noted. Msk:  No deformity or scoliosis noted of thoracic or lumbar spine.   Neurologic:  No cranial nerve deficits noted. Station and gait are normal. Plantar reflexes are down-going bilaterally. DTRs are symmetrical throughout. Sensory, motor and coordinative functions appear intact. Skin:  dry patch on R knee Psych:  Cognition and judgment appear intact. Alert and cooperative with normal attention span and concentration. No apparent delusions, illusions, hallucinations. Not suicidal, depressed affect.     Impression & Recommendations:  Problem # 1:  VITAMIN B12 DEFICIENCY (ICD-266.2) Assessment Unchanged  Risks of noncompliance with treatment discussed. Compliance encouraged.   Orders: Vit B12 1000 mcg (J3420) Admin of Therapeutic Inj  intramuscular or  subcutaneous (16109)  Problem # 2:  HYPERLIPIDEMIA (ICD-272.4) Assessment: Deteriorated The labs were reviewed with the patient.    RESTART Crestor 20 Mg Tabs (Rosuvastatin calcium) .Marland Kitchen... 1 tablet by mouth daily Risks of noncompliance with treatment discussed. Compliance encouraged.   Problem # 3:  INSOMNIA, PERSISTENT (ICD-307.42) Assessment: Unchanged  Problem # 4:  ANXIETY (ICD-300.00) Assessment: Unchanged  Her updated medication list for this problem includes:     Alprazolam 0.25 Mg Tabs (Alprazolam) .Marland Kitchen... 1 by mouth two times a day as needed anxiety    Citalopram Hydrobromide 10 Mg Tabs (Citalopram hydrobromide) .Marland Kitchen... Take 1 tab by mouth daily  Problem # 5:  TOBACCO USE DISORDER/SMOKER-SMOKING CESSATION DISCUSSED (ICD-305.1) Assessment: Unchanged  Encouraged smoking cessation and discussed different methods for smoking cessation.   Complete Medication List: 1)  Aspir-low 81 Mg Tbec (Aspirin) .Marland Kitchen.. 1 once daily pc 2)  Vitamin D3 1000 Unit Tabs (Cholecalciferol) .Marland Kitchen.. 1 qd 3)  Synthroid 50 Mcg Tabs (Levothyroxine sodium) .Marland Kitchen.. 1 by mouth qd 4)  Alprazolam 0.25 Mg Tabs (Alprazolam) .Marland Kitchen.. 1 by mouth two times a day as needed anxiety 5)  Citalopram Hydrobromide 10 Mg Tabs (Citalopram hydrobromide) .... Take 1 tab by mouth daily 6)  Crestor 20 Mg Tabs (Rosuvastatin calcium) .Marland Kitchen.. 1 tablet by mouth daily 7)  Loratadine 10 Mg Tabs (Loratadine) .Marland Kitchen.. 1 by mouth once daily as needed allergies 8)  Sudafed 12 Hour 120 Mg Xr12h-tab (Pseudoephedrine hcl) .Marland Kitchen.. 1 by mouth two times a day as needed allergies 9)  Triamcinolone Acetonide 0.5 % Crea (Triamcinolone acetonide) .... Use two times a day prn 10)  Zithromax Z-pak 250 Mg Tabs (Azithromycin) .... As dirrected 11)  Vitamin B-12 500 Mcg Tabs (Cyanocobalamin) .Marland Kitchen.. 1 by mouth once daily for vitamin b12 deficiency  Patient Instructions: 1)  Try to eat more raw plant food, fresh and dry fruit, raw almonds, leafy vegetables, whole foods and less red meat, less animal fat. Poultry and fish is better for you than pork and beef. Avoid processed foods (canned soups, hot dogs, sausage, bacon , frozen dinners). Avoid corn syrup, high fructose syrup or aspartam  containing drinks. Honey, Agave and Stevia are better sweeteners. Make your own  dressing with olive oil, wine vinegar, lemon juce, garlic etc. for your salads. 2)  Please schedule a follow-up appointment in 6 months well w/labs and B12 266.6. Prescriptions: VITAMIN B-12  500 MCG TABS (CYANOCOBALAMIN) 1 by mouth once daily for Vitamin B12 deficiency  #30 x 12   Entered and Authorized by:   Tresa Garter MD   Signed by:   Tresa Garter MD on 10/21/2009   Method used:   Print then Give to Patient   RxID:   [id number] ZITHROMAX Z-PAK 250 MG TABS (AZITHROMYCIN) as dirrected  #1 x 0   Entered and Authorized by:   Tresa Garter MD   Signed by:   Tresa Garter MD on 10/21/2009   Method used:   Print then Give to Patient   RxID:   [id number] CRESTOR 20 MG TABS (ROSUVASTATIN CALCIUM) 1 tablet by mouth daily  #30 x 12   Entered and Authorized by:   Tresa Garter MD   Signed by:   Tresa Garter MD on 10/21/2009   Method used:   Print then Give to Patient   RxID:   [id number] ALPRAZOLAM 0.25 MG TABS (ALPRAZOLAM) 1 by mouth two times a day as needed anxiety  #60 x 3   Entered and Authorized  by:   Tresa Garter MD   Signed by:   Tresa Garter MD on 10/21/2009   Method used:   Print then Give to Patient   RxID:   [id number] TRIAMCINOLONE ACETONIDE 0.5 % CREA (TRIAMCINOLONE ACETONIDE) use two times a day prn  #60 g x 3   Entered and Authorized by:   Tresa Garter MD   Signed by:   Tresa Garter MD on 10/21/2009   Method used:   Print then Give to Patient   RxID:   [id number] CITALOPRAM HYDROBROMIDE 10 MG  TABS (CITALOPRAM HYDROBROMIDE) Take 1 tab by mouth daily  #30 x 6   Entered and Authorized by:   Tresa Garter MD   Signed by:   Tresa Garter MD on 10/21/2009   Method used:   [facility] to        [facility]  [address] [phone]* (retail)       [address]       [place], [place]  [address]       Ph: [phone] or [phone]       Fax: [phone]   RxID:   [id number] SYNTHROID 50 MCG TABS (LEVOTHYROXINE SODIUM) 1 by mouth qd  #90 Tablet x 12   Entered and Authorized by:   Tresa Garter MD   Signed by:   Tresa Garter MD on 10/21/2009   Method used:   [facility]        [facility]  [address] [phone]* (retail)       [address]       [place], [place]  [id number]       Ph: [phone] or [phone]       Fax: [phone]   RxID:   [id number]    Medication Administration  Injection # 1:    Medication: Vit B12 1000 mcg    Diagnosis: VITAMIN B12 DEFICIENCY (ICD-266.2)    Route: IM    Site: L deltoid    Exp Date: 06/27/2011    Lot #: [id number]    Mfr: [employer]    Patient tolerated injection without complications    Given by: [name] Prude, CMA(AAMA) (October 21, 2009 9:54 AM)  Orders Added: 1)  Vit B12 1000 mcg [J3420] 2)  Admin of Therapeutic Inj  intramuscular or subcutaneous [96372] 3)  Est. Patient Level IV [25852]

## 2010-04-29 NOTE — Progress Notes (Signed)
Summary: Rf Azithromycin  Phone Note From Pharmacy   Summary of Call: Rf request for Azithromycin 250mg  as directed # 6.  ok to RF??? Initial call taken by: [name] Prude, [facility]),  December 15, 2009 10:43 AM  Follow-up for Phone Call        ok - see prior note Follow-up by: Tresa Garter MD,  December 15, 2009 5:22 PM  Additional Follow-up for Phone Call Additional follow up Details #1::        pt left Vm requesting above.  I called her to see what symptoms she  is having. No answer.  Which note are you referring to?  Please advise ok to RF? Additional Follow-up by: [name] Prude, [facility]),  December 16, 2009 10:30 AM    Additional Follow-up for Phone Call Additional follow up Details #2::    Pt c/o sore throat since monday afternoon. She states her lymphnodes under her neck are swollen and her ear is hurting. She has no fever. What do you advise. Follow-up by: Ami Bullins CMA,  December 16, 2009 11:32 AM  Additional Follow-up for Phone Call Additional follow up Details #3:: Details for Additional Follow-up Action Taken: SPoke with pt who was somewhat vague about sxs; ST, Earache, no fever. Ptstated that she wanted appt with Dr Demetrius Charity today but there was not an appt available. Pt was advised that there are available appt tomorrow morning. Pt stated that she would see what times and call back to schedule. Additional Follow-up by: Margaret Pyle, CMA,  December 16, 2009 11:38 AM

## 2010-04-29 NOTE — Progress Notes (Signed)
  Phone Note Call from Patient Call back at Home Phone [phone]   Caller: Patient Summary of Call: Pt called very concerned about PT message recieved regarding Immune Sysytem. Pt is requesting a call back, please advise?  Follow-up for Phone Call        i called pt 02/09/10.  we discussed that this test addresses her tendency to have autoimmune probs, but does not diagnose.  if you have sxs in the furure, i wuld be reasonable to consider your autoimmune hx. Follow-up by: Minus Breeding MD,  February 10, 2010 3:47 PM

## 2010-04-29 NOTE — Progress Notes (Signed)
Summary: Alprazolam  Phone Note Refill Request Message from:  Fax from Pharmacy on September 16, 2009 2:19 PM  Refills Requested: Medication #1:  ALPRAZOLAM 0.25 MG TABS 1 by mouth two times a day as needed anxiety   Last Refilled: 07/02/2009  Method Requested: Telephone to Pharmacy Next Appointment Scheduled: 10/21/09 Initial call taken by: [name] Prude, [facility]),  September 16, 2009 2:21 PM  Follow-up for Phone Call        ok x 3 Follow-up by: Tresa Garter MD,  September 16, 2009 5:16 PM  Additional Follow-up for Phone Call Additional follow up Details #1::        Patient aware. Additional Follow-up by: Lucious Groves,  September 17, 2009 11:31 AM    Prescriptions: ALPRAZOLAM 0.25 MG TABS (ALPRAZOLAM) 1 by mouth two times a day as needed anxiety  #60 x 3   Entered by:   Lucious Groves   Authorized by:   Tresa Garter MD   Signed by:   Lucious Groves on 09/17/2009   Method used:   Telephoned to ...       [facility]  [address] [phone]* (retail)       [address]       [place], [place]  [id number]       Ph: [phone] or [phone]       Fax: [phone]   RxID:   [id number]

## 2010-05-25 ENCOUNTER — Encounter: Payer: Self-pay | Admitting: Internal Medicine

## 2010-05-25 ENCOUNTER — Ambulatory Visit (INDEPENDENT_AMBULATORY_CARE_PROVIDER_SITE_OTHER): Payer: BC Managed Care – PPO

## 2010-05-25 DIAGNOSIS — E538 Deficiency of other specified B group vitamins: Secondary | ICD-10-CM

## 2010-06-03 NOTE — Assessment & Plan Note (Signed)
Summary: PER PT B12 INJ--AVP---STC   Nurse Visit   Allergies: 1)  ! Lipitor (Atorvastatin Calcium) 2)  ! Vytorin (Ezetimibe-Simvastatin)  Medication Administration  Injection # 1:    Medication: Vit B12 1000 mcg    Diagnosis: VITAMIN B12 DEFICIENCY (ICD-266.2)    Route: IM    Site: L deltoid    Exp Date: 01/2012    Lot #: [id number]    Mfr: [employer]    Patient tolerated injection without complications    Given by: Burnard Leigh CMA(AAMA) (May 25, 2010 9:32 AM)  Orders Added: 1)  Admin of Therapeutic Inj  intramuscular or subcutaneous [96372] 2)  Vit B12 1000 mcg [J3420]

## 2010-06-18 ENCOUNTER — Telehealth: Payer: Self-pay | Admitting: *Deleted

## 2010-06-18 NOTE — Telephone Encounter (Signed)
Okay to refill #60  with 2 refills. Thank you !

## 2010-06-18 NOTE — Telephone Encounter (Signed)
Rf req for alprazolam 0.25mg   1 po bid # 60.   Last Rf 04-12-10  Ok to Rf???

## 2010-06-21 MED ORDER — ALPRAZOLAM 0.25 MG PO TABS: 0.2500 mg | ORAL_TABLET | Freq: Two times a day (BID) | ORAL | Status: DC

## 2010-06-21 NOTE — Telephone Encounter (Signed)
rf phoned in 

## 2010-07-15 ENCOUNTER — Ambulatory Visit: Payer: BC Managed Care – PPO

## 2010-07-30 ENCOUNTER — Other Ambulatory Visit: Payer: Self-pay | Admitting: *Deleted

## 2010-07-30 ENCOUNTER — Other Ambulatory Visit (INDEPENDENT_AMBULATORY_CARE_PROVIDER_SITE_OTHER): Payer: BC Managed Care – PPO

## 2010-07-30 DIAGNOSIS — E039 Hypothyroidism, unspecified: Secondary | ICD-10-CM

## 2010-07-30 NOTE — Progress Notes (Signed)
Per [facility] pt is here stating she is due to have thyroid checked per Dr. Posey Rea. Orders entered

## 2010-08-09 ENCOUNTER — Encounter: Payer: Self-pay | Admitting: Internal Medicine

## 2010-08-10 ENCOUNTER — Encounter: Payer: Self-pay | Admitting: Internal Medicine

## 2010-08-10 ENCOUNTER — Ambulatory Visit (INDEPENDENT_AMBULATORY_CARE_PROVIDER_SITE_OTHER): Payer: BC Managed Care – PPO | Admitting: Internal Medicine

## 2010-08-10 DIAGNOSIS — E039 Hypothyroidism, unspecified: Secondary | ICD-10-CM

## 2010-08-10 DIAGNOSIS — F329 Major depressive disorder, single episode, unspecified: Secondary | ICD-10-CM

## 2010-08-10 DIAGNOSIS — R739 Hyperglycemia, unspecified: Secondary | ICD-10-CM

## 2010-08-10 DIAGNOSIS — R7309 Other abnormal glucose: Secondary | ICD-10-CM

## 2010-08-10 DIAGNOSIS — J069 Acute upper respiratory infection, unspecified: Secondary | ICD-10-CM

## 2010-08-10 DIAGNOSIS — E538 Deficiency of other specified B group vitamins: Secondary | ICD-10-CM

## 2010-08-10 DIAGNOSIS — E785 Hyperlipidemia, unspecified: Secondary | ICD-10-CM

## 2010-08-10 MED ORDER — AZITHROMYCIN 250 MG PO TABS: ORAL_TABLET | ORAL | Status: AC

## 2010-08-10 MED ORDER — LEVOTHYROXINE SODIUM 75 MCG PO TABS: 75.0000 ug | ORAL_TABLET | Freq: Every day | ORAL | Status: DC

## 2010-08-10 MED ORDER — KETOCONAZOLE 2 % EX CREA: TOPICAL_CREAM | Freq: Every day | CUTANEOUS | Status: DC

## 2010-08-10 MED ORDER — CYANOCOBALAMIN 1000 MCG/ML IJ SOLN
1000.0000 ug | Freq: Once | INTRAMUSCULAR | Status: AC
  Administered 2010-08-10: 1000 ug via INTRAMUSCULAR

## 2010-08-10 MED ORDER — ALPRAZOLAM 0.25 MG PO TABS: 0.2500 mg | ORAL_TABLET | Freq: Two times a day (BID) | ORAL | Status: DC

## 2010-08-10 NOTE — Assessment & Plan Note (Signed)
[facility]                             PULMONARY OFFICE NOTE   NAME:Earnshaw, Vanessa DACK                     MRN:          [id number]  DATE:02/20/2007                            DOB:          [birth year]    HISTORY OF PRESENT ILLNESS:  The patient is a very pleasant 50 year old  African-American female patient of Dr. Loren Racer who has a known  history of hyperlipidemia and rhinitis, and a current smoker, who  presents for an acute office visit.  The patient complains of a 4-day  history of nasal congestion, sinus pain and pressure with thick  yellowish nasal discharge and cough.  The patient denies any fever,  chest pain, orthopnea, PND, recent travel, or antibiotic use.   PAST MEDICAL HISTORY:  Reviewed.   CURRENT MEDICATIONS:  Reviewed.   PHYSICAL EXAM:  The patient is a pleasant female in no acute distress.  She is afebrile with stable vital signs.  O2 saturation is 96% on room  air.  HEENT:  Nasal mucosa is erythematous.  Maxillary sinus tenderness to  percussion.  Posterior pharynx is clear.  NECK:  Supple without cervical adenopathy.  No JVD.  LUNGS:  Sounds are clear.  CARDIAC:  Regular rate.  ABDOMEN:  Soft and nontender.  EXTREMITIES:  Warm without any edema.   IMPRESSION AND PLAN:  Acute upper respiratory infection with a probable  early sinusitis.  The patient is to begin Augmentin x10 days.  Mucinex  DM twice a day.  Nasal hygiene regimen with saline and Afrin nasal  spray.  The patient is to return back to Dr. Posey Rea in 2 weeks as  scheduled or sooner if needed.      Rubye Oaks, NP  Electronically Signed      Lonzo Cloud. Kriste Basque, MD  Electronically Signed   TP/MedQ  DD: 02/20/2007  DT: 02/20/2007  Job #: [id number]

## 2010-08-10 NOTE — Progress Notes (Signed)
  Subjective:    Vanessa [name] ID: Vanessa Vanessa [name], female    DOB: 11/01/1960, 50 y.o.   MRN: [id number]  HPI  The Vanessa [name] presents for a follow-up of  chronic hypertension, chronic dyslipidemia, B12 def,  type 2 diabetes controlled with medicines. C/o fatigue x 2-3 mo  Review of Systems  Constitutional: Positive for fatigue. Negative for fever.  HENT: Negative for congestion and dental problem.   Eyes: Negative for pain.  Respiratory: Negative for shortness of breath.   Gastrointestinal: Negative for blood in stool.  Genitourinary: Negative for flank pain and decreased urine volume.  Musculoskeletal: Negative for gait problem.  Neurological: Negative for syncope and light-headedness.  Psychiatric/Behavioral: The Vanessa [name] is not nervous/anxious.    Wt Readings from Last 3 Encounters:  08/10/10 176 lb (79.833 kg)  02/02/10 175 lb 6.1 oz (79.552 kg)  10/21/09 174 lb (78.926 kg)      Objective:   Physical Exam  Constitutional: She appears well-developed and well-nourished. No distress.  HENT:  Head: Normocephalic.  Right Ear: External ear normal.  Left Ear: External ear normal.  Nose: Nose normal.  Mouth/Throat: Oropharynx is clear and moist.  Eyes: Conjunctivae are normal. Pupils are equal, round, and reactive to light. Right eye exhibits no discharge. Left eye exhibits no discharge.  Neck: Normal range of motion. Neck supple. No JVD present. No tracheal deviation present. No thyromegaly present.  Cardiovascular: Normal rate, regular rhythm and normal heart sounds.   Pulmonary/Chest: No stridor. No respiratory distress. She has no wheezes.  Abdominal: Soft. Bowel sounds are normal. She exhibits no distension and no mass. There is no tenderness. There is no rebound and no guarding.  Musculoskeletal: She exhibits no edema and no tenderness.  Lymphadenopathy:    She has no cervical adenopathy.  Neurological: She displays normal reflexes. No cranial nerve deficit. She exhibits normal  muscle tone. Coordination normal.  Skin: No rash noted. No erythema.  Psychiatric: She has a normal mood and affect. Her behavior is normal. Judgment and thought content normal.        R TM red  Assessment & Plan:  OM R  Start Abx  VITAMIN B12 DEFICIENCY Will give IM inj  URI New URI Z pac  HYPOTHYROIDISM On rx - labs reviewed  DEPRESSION Doing well now

## 2010-08-10 NOTE — Assessment & Plan Note (Signed)
Doing well now 

## 2010-08-10 NOTE — Assessment & Plan Note (Signed)
Will give IM inj

## 2010-08-10 NOTE — Assessment & Plan Note (Signed)
On rx - labs reviewed

## 2010-08-10 NOTE — Assessment & Plan Note (Signed)
New URI Z pac

## 2010-08-14 ENCOUNTER — Encounter: Payer: Self-pay | Admitting: Internal Medicine

## 2010-08-16 ENCOUNTER — Telehealth: Payer: Self-pay | Admitting: *Deleted

## 2010-08-16 NOTE — Telephone Encounter (Signed)
Patient requesting RF of zpak, she continue to c/o ear pain.

## 2010-08-16 NOTE — Telephone Encounter (Signed)
OK to fill this prescription with additional refills x0 Thank you!  

## 2010-08-17 MED ORDER — AZITHROMYCIN 250 MG PO TABS: ORAL_TABLET | ORAL | Status: AC

## 2010-08-17 NOTE — Telephone Encounter (Signed)
Left vm for pt to check with pharm

## 2010-08-31 ENCOUNTER — Telehealth: Payer: Self-pay | Admitting: *Deleted

## 2010-08-31 NOTE — Telephone Encounter (Signed)
Spoke w/pt regarding tick bites. She was in garden and noticed 4 ticks. Husband removed these but she is worried. I advised her that she needs to wash area with soap and water, look for signs of infection and watch for rash and/or any new symptoms. She agreed and will call with any problems.

## 2010-08-31 NOTE — Telephone Encounter (Signed)
Agree. Thx 

## 2010-09-10 ENCOUNTER — Encounter: Payer: Self-pay | Admitting: *Deleted

## 2010-09-10 ENCOUNTER — Ambulatory Visit (INDEPENDENT_AMBULATORY_CARE_PROVIDER_SITE_OTHER): Payer: BC Managed Care – PPO | Admitting: *Deleted

## 2010-09-10 ENCOUNTER — Ambulatory Visit: Payer: BC Managed Care – PPO

## 2010-09-10 DIAGNOSIS — E538 Deficiency of other specified B group vitamins: Secondary | ICD-10-CM

## 2010-09-10 MED ORDER — CYANOCOBALAMIN 1000 MCG/ML IJ SOLN
1000.0000 ug | Freq: Once | INTRAMUSCULAR | Status: AC
  Administered 2010-09-10: 1000 ug via INTRAMUSCULAR

## 2010-10-20 ENCOUNTER — Ambulatory Visit (INDEPENDENT_AMBULATORY_CARE_PROVIDER_SITE_OTHER): Payer: BC Managed Care – PPO | Admitting: Internal Medicine

## 2010-10-20 ENCOUNTER — Encounter: Payer: Self-pay | Admitting: Internal Medicine

## 2010-10-20 DIAGNOSIS — J0100 Acute maxillary sinusitis, unspecified: Secondary | ICD-10-CM

## 2010-10-20 DIAGNOSIS — R42 Dizziness and giddiness: Secondary | ICD-10-CM

## 2010-10-20 DIAGNOSIS — E538 Deficiency of other specified B group vitamins: Secondary | ICD-10-CM

## 2010-10-20 MED ORDER — CYANOCOBALAMIN 1000 MCG/ML IJ SOLN
1000.0000 ug | Freq: Once | INTRAMUSCULAR | Status: AC
  Administered 2010-10-20: 1000 ug via INTRAMUSCULAR

## 2010-10-20 MED ORDER — MECLIZINE HCL 12.5 MG PO TABS: 12.5000 mg | ORAL_TABLET | Freq: Three times a day (TID) | ORAL | Status: DC | PRN

## 2010-10-20 MED ORDER — LEVOFLOXACIN 500 MG PO TABS: 500.0000 mg | ORAL_TABLET | Freq: Every day | ORAL | Status: AC

## 2010-10-20 NOTE — Progress Notes (Signed)
  Subjective:    [name], female    DOB: 04-10-1960, 50 y.o.   MRN: [id number]  HPI  complains of sinus pressure Onset 6 days ago, symptoms progressivley worse associated with dizziness, r ear pressure and malaise - also dry cough Denies fever but +chills; no sore throat or chest pain  Treated self with left over Zpak- improved briefly but now worse  PMH reviewed -  Review of Systems  HENT: Negative for neck stiffness.   Eyes: Negative for visual disturbance.  Respiratory: Negative for shortness of breath.   Cardiovascular: Negative for leg swelling.      Objective:   Physical Exam BP 120/88  Pulse 69  Temp(Src) 98.6 F (37 C) (Oral)  Ht 5\' 2"  (1.575 m)  Wt 179 lb (81.194 kg)  BMI 32.74 kg/m2  SpO2 99% Constitutional: She is oriented to person, place, and time. She appears well-developed and well-nourished. No distress but mildly ill  HENT: Head: Normocephalic and atraumatic. Ears; B TMs ok, no erythema or effusion; Nose: Nose normal. +sinus tenderness R>L maxillary region Mouth/Throat: Oropharynx is erythematous, +PND. No oropharyngeal exudate.  Eyes: Conjunctivae and EOM are normal. Pupils are equal, round, and reactive to light. No scleral icterus.  Neck: Normal range of motion. Neck supple. No JVD present. No thyromegaly present.  Cardiovascular: Normal rate, regular rhythm and normal heart sounds.  No murmur heard. No BLE edema. Pulmonary/Chest: Effort normal and breath sounds normal. No respiratory distress. She has no wheezes.  Neurological: She is alert and oriented to person, place, and time. No cranial nerve deficit. Coordination normal. MAE well, cognition and speech normal Psychiatric: She has a normal mood and affect. Her behavior is normal. Judgment and thought content normal.        Assessment & Plan:  Acute sinusitus - tx with Levaquin - erx done, work note provided -  Vertigo - suspect reactive to infx - neuro exam benign - meclizine  prn dizzy symptoms  B12 defic - IM replacement shot as due - given today

## 2010-10-20 NOTE — Patient Instructions (Signed)
It was good to see you today. Levaquin antibiotics for sinus infection - also meclizine to use as needed for dizziness Your prescription(s) have been submitted to your pharmacy. Please take as directed and contact our office if you believe you are having problem(s) with the medication(s). Work note provided as requested B12 shot done today

## 2010-10-21 ENCOUNTER — Encounter: Payer: Self-pay | Admitting: Internal Medicine

## 2010-11-10 ENCOUNTER — Ambulatory Visit: Payer: BC Managed Care – PPO | Admitting: Internal Medicine

## 2010-11-16 ENCOUNTER — Telehealth: Payer: Self-pay | Admitting: *Deleted

## 2010-11-16 MED ORDER — FLUCONAZOLE 150 MG PO TABS: 150.0000 mg | ORAL_TABLET | Freq: Once | ORAL | Status: AC

## 2010-11-16 NOTE — Telephone Encounter (Signed)
PT CALLING C/O YEAST INFECTION X 3DAYS. PT WENT TO [facility]ENTIST AND HAD TO TAKE ANTIBODY FOR 10DAYS NOW PT HAS YEAST. SHE TOOK OTC CREAM NO RELIEF. PT WOULD RX FOR DIFLUCAN IF POSSIBLE. PLEASE ADVISE.

## 2010-11-16 NOTE — Telephone Encounter (Signed)
Vanessa [name], go ahead and order Diflucan 150 mg tablet for this patient with 2 refills.

## 2010-11-16 NOTE — Telephone Encounter (Signed)
PT CALLED C/O YEAST INFECTION, LM FOR PT TO CALL.

## 2010-11-16 NOTE — Telephone Encounter (Signed)
PT INFORMED WITH THE BELOW, [facility]

## 2010-11-18 ENCOUNTER — Encounter: Payer: Self-pay | Admitting: Gynecology

## 2010-11-23 ENCOUNTER — Ambulatory Visit: Payer: BC Managed Care – PPO

## 2010-11-25 ENCOUNTER — Ambulatory Visit: Payer: BC Managed Care – PPO

## 2010-12-06 ENCOUNTER — Telehealth: Payer: Self-pay | Admitting: *Deleted

## 2010-12-06 DIAGNOSIS — Z0000 Encounter for general adult medical examination without abnormal findings: Secondary | ICD-10-CM

## 2010-12-06 NOTE — Telephone Encounter (Signed)
Pharm faxed RF request -  Alprazolam 0.25 mg #60

## 2010-12-07 MED ORDER — ALPRAZOLAM 0.25 MG PO TABS: 0.2500 mg | ORAL_TABLET | Freq: Two times a day (BID) | ORAL | Status: DC

## 2010-12-07 NOTE — Telephone Encounter (Signed)
Called in.

## 2010-12-07 NOTE — Telephone Encounter (Signed)
OK to fill this prescription with additional refills x2 Thank you!  

## 2010-12-14 ENCOUNTER — Ambulatory Visit: Payer: BC Managed Care – PPO | Admitting: Internal Medicine

## 2010-12-23 ENCOUNTER — Other Ambulatory Visit (INDEPENDENT_AMBULATORY_CARE_PROVIDER_SITE_OTHER): Payer: BC Managed Care – PPO

## 2010-12-23 DIAGNOSIS — Z0000 Encounter for general adult medical examination without abnormal findings: Secondary | ICD-10-CM

## 2010-12-23 LAB — URINALYSIS, ROUTINE W REFLEX MICROSCOPIC
Bilirubin Urine: NEGATIVE
Ketones, ur: NEGATIVE
Nitrite: NEGATIVE

## 2010-12-23 LAB — CBC WITH DIFFERENTIAL/PLATELET
Basophils Relative: 0.7 % (ref 0.0–3.0)
Hemoglobin: 13.9 g/dL (ref 12.0–15.0)
Lymphocytes Relative: 27.4 % (ref 12.0–46.0)
Monocytes Relative: 5.5 % (ref 3.0–12.0)
Neutro Abs: 7 10*3/uL (ref 1.4–7.7)
RBC: 4.63 Mil/uL (ref 3.87–5.11)

## 2010-12-24 LAB — HEPATIC FUNCTION PANEL
AST: 20 U/L (ref 0–37)
Albumin: 4 g/dL (ref 3.5–5.2)
Alkaline Phosphatase: 52 U/L (ref 39–117)
Total Protein: 7.5 g/dL (ref 6.0–8.3)

## 2010-12-24 LAB — LIPID PANEL
LDL Cholesterol: 109 mg/dL — ABNORMAL HIGH (ref 0–99)
Total CHOL/HDL Ratio: 4

## 2010-12-24 LAB — BASIC METABOLIC PANEL
BUN: 17 mg/dL (ref 6–23)
Chloride: 108 mEq/L (ref 96–112)
Creatinine, Ser: 0.7 mg/dL (ref 0.4–1.2)

## 2010-12-28 ENCOUNTER — Ambulatory Visit (INDEPENDENT_AMBULATORY_CARE_PROVIDER_SITE_OTHER): Payer: BC Managed Care – PPO | Admitting: Internal Medicine

## 2010-12-28 ENCOUNTER — Encounter: Payer: Self-pay | Admitting: Internal Medicine

## 2010-12-28 VITALS — BP 126/84 | HR 73 | Temp 97.7°F | Wt 180.0 lb

## 2010-12-28 DIAGNOSIS — Z136 Encounter for screening for cardiovascular disorders: Secondary | ICD-10-CM

## 2010-12-28 DIAGNOSIS — Z0000 Encounter for general adult medical examination without abnormal findings: Secondary | ICD-10-CM | POA: Insufficient documentation

## 2010-12-28 DIAGNOSIS — N951 Menopausal and female climacteric states: Secondary | ICD-10-CM

## 2010-12-28 DIAGNOSIS — E785 Hyperlipidemia, unspecified: Secondary | ICD-10-CM

## 2010-12-28 DIAGNOSIS — E538 Deficiency of other specified B group vitamins: Secondary | ICD-10-CM

## 2010-12-28 DIAGNOSIS — F329 Major depressive disorder, single episode, unspecified: Secondary | ICD-10-CM

## 2010-12-28 MED ORDER — CYANOCOBALAMIN 1000 MCG/ML IJ SOLN
1000.0000 ug | Freq: Once | INTRAMUSCULAR | Status: AC
  Administered 2010-12-28: 1000 ug via INTRAMUSCULAR

## 2010-12-28 MED ORDER — ALPRAZOLAM 0.25 MG PO TABS: 0.2500 mg | ORAL_TABLET | Freq: Two times a day (BID) | ORAL | Status: DC

## 2010-12-28 MED ORDER — ROSUVASTATIN CALCIUM 20 MG PO TABS: 20.0000 mg | ORAL_TABLET | Freq: Every day | ORAL | Status: DC

## 2010-12-28 MED ORDER — CYANOCOBALAMIN 1000 MCG/ML IJ SOLN: 1000.0000 ug | INTRAMUSCULAR | Status: DC

## 2010-12-28 MED ORDER — TRIAMCINOLONE ACETONIDE 0.5 % EX CREA: TOPICAL_CREAM | Freq: Two times a day (BID) | CUTANEOUS | Status: DC

## 2010-12-28 NOTE — Assessment & Plan Note (Addendum)
We discussed age appropriate health related issues, including available/recomended screening tests and vaccinations. We discussed a need for adhering to healthy diet and exercise. Labs/EKG were reviewed/ordered. All questions were answered. Colonoscopy discussed Shots discussed

## 2010-12-28 NOTE — Assessment & Plan Note (Signed)
Continue with current prescription therapy as reflected on the Med list.  

## 2010-12-28 NOTE — Patient Instructions (Signed)
Wt Readings from Last 3 Encounters:  12/28/10 180 lb (81.647 kg)  10/20/10 179 lb (81.194 kg)  08/10/10 176 lb (79.833 kg)

## 2010-12-30 ENCOUNTER — Encounter: Payer: Self-pay | Admitting: Internal Medicine

## 2010-12-30 NOTE — Progress Notes (Signed)
  Subjective:    [name], female    DOB: 1961-03-26, 50 y.o.   MRN: [id number]  HPI Vanessa [name] is here for a wellness exam. Vanessa [name] has been doing well overall without major physical or psychological issues going on lately.  Review of Systems  Constitutional: Negative.  Negative for fever, chills, diaphoresis, activity change, appetite change, fatigue and unexpected weight change.  HENT: Negative for hearing loss, ear pain, nosebleeds, congestion, sore throat, facial swelling, rhinorrhea, sneezing, mouth sores, trouble swallowing, neck pain, neck stiffness, postnasal drip, sinus pressure and tinnitus.   Eyes: Negative for pain, discharge, redness, itching and visual disturbance.  Respiratory: Negative for cough, chest tightness, shortness of breath, wheezing and stridor.   Cardiovascular: Negative for chest pain, palpitations and leg swelling.  Gastrointestinal: Negative for nausea, diarrhea, constipation, blood in stool, abdominal distention, anal bleeding and rectal pain.  Genitourinary: Negative for dysuria, urgency, frequency, hematuria, flank pain, vaginal bleeding, vaginal discharge, difficulty urinating, genital sores, vaginal pain and pelvic pain.  Musculoskeletal: Negative for back pain, joint swelling, arthralgias and gait problem.  Skin: Negative.  Negative for rash.  Neurological: Negative for dizziness, tremors, seizures, syncope, speech difficulty, weakness, numbness and headaches.  Hematological: Negative for adenopathy. Does not bruise/bleed easily.  Psychiatric/Behavioral: Negative for suicidal ideas, behavioral problems, sleep disturbance, dysphoric mood and decreased concentration. Vanessa [name] is not nervous/anxious.        Objective:   Physical Exam  Constitutional: She appears well-developed. No distress.       obese  HENT:  Head: Normocephalic.  Right Ear: External ear normal.  Left Ear: External ear normal.  Nose: Nose normal.    Mouth/Throat: Oropharynx is clear and moist.  Eyes: Conjunctivae are normal. Pupils are equal, round, and reactive to light. Right eye exhibits no discharge. Left eye exhibits no discharge.  Neck: Normal range of motion. Neck supple. No JVD present. No tracheal deviation present. No thyromegaly present.  Cardiovascular: Normal rate, regular rhythm and normal heart sounds.   Pulmonary/Chest: No stridor. No respiratory distress. She has no wheezes.  Abdominal: Soft. Bowel sounds are normal. She exhibits no distension and no mass. There is no tenderness. There is no rebound and no guarding.  Musculoskeletal: She exhibits no edema and no tenderness.  Lymphadenopathy:    She has no cervical adenopathy.  Neurological: She displays normal reflexes. No cranial nerve deficit. She exhibits normal muscle tone. Coordination normal.  Skin: No rash noted. No erythema.  Psychiatric: She has a normal mood and affect. Her behavior is normal. Judgment and thought content normal.   Lab Results  Component Value Date   WBC 11.3* 12/23/2010   HGB 13.9 12/23/2010   HCT 40.9 12/23/2010   PLT 313.0 12/23/2010   GLUCOSE 74 12/23/2010   CHOL 169 12/23/2010   TRIG 84.0 12/23/2010   HDL 43.10 12/23/2010   LDLDIRECT 246.5 10/16/2009   LDLCALC 109* 12/23/2010   ALT 18 12/23/2010   AST 20 12/23/2010   NA 143 12/23/2010   K 4.1 12/23/2010   CL 108 12/23/2010   CREATININE 0.7 12/23/2010   BUN 17 12/23/2010   CO2 26 12/23/2010   TSH 1.07 12/23/2010          Assessment & Plan:

## 2011-01-28 ENCOUNTER — Ambulatory Visit: Payer: BC Managed Care – PPO

## 2011-02-02 ENCOUNTER — Ambulatory Visit: Payer: BC Managed Care – PPO

## 2011-02-03 ENCOUNTER — Ambulatory Visit: Payer: BC Managed Care – PPO

## 2011-02-04 ENCOUNTER — Other Ambulatory Visit: Payer: BC Managed Care – PPO | Admitting: Gastroenterology

## 2011-02-07 ENCOUNTER — Ambulatory Visit: Payer: BC Managed Care – PPO

## 2011-02-07 DIAGNOSIS — E538 Deficiency of other specified B group vitamins: Secondary | ICD-10-CM

## 2011-02-07 MED ORDER — CYANOCOBALAMIN 1000 MCG/ML IJ SOLN
1000.0000 ug | Freq: Once | INTRAMUSCULAR | Status: AC
  Administered 2011-02-07: 1000 ug via INTRAMUSCULAR

## 2011-02-22 ENCOUNTER — Telehealth: Payer: Self-pay | Admitting: *Deleted

## 2011-02-22 DIAGNOSIS — L308 Other specified dermatitis: Secondary | ICD-10-CM

## 2011-02-22 NOTE — Telephone Encounter (Signed)
Pt is req referral to dermatology.

## 2011-02-22 NOTE — Telephone Encounter (Signed)
OK - done Thx 

## 2011-02-22 NOTE — Telephone Encounter (Signed)
Pt called stating the psoriasis isnt helping. And she has other ?s. I called her work number and wasn't able to reach her. I called her cell phone and Left mess for patient to call back.

## 2011-02-23 ENCOUNTER — Other Ambulatory Visit: Payer: BC Managed Care – PPO | Admitting: Gastroenterology

## 2011-02-23 NOTE — Telephone Encounter (Signed)
Informed patient that her referral has been put in and as soon as we get the appointment we would call and inform her.  She just wanted to catch her psoriasis early before it gets worse.

## 2011-03-04 ENCOUNTER — Ambulatory Visit: Payer: BC Managed Care – PPO

## 2011-03-09 ENCOUNTER — Ambulatory Visit (INDEPENDENT_AMBULATORY_CARE_PROVIDER_SITE_OTHER): Payer: BC Managed Care – PPO | Admitting: Gynecology

## 2011-03-09 ENCOUNTER — Encounter: Payer: Self-pay | Admitting: Gynecology

## 2011-03-09 ENCOUNTER — Ambulatory Visit (INDEPENDENT_AMBULATORY_CARE_PROVIDER_SITE_OTHER): Payer: BC Managed Care – PPO | Admitting: *Deleted

## 2011-03-09 VITALS — BP 140/90 | Temp 98.0°F

## 2011-03-09 DIAGNOSIS — B373 Candidiasis of vulva and vagina: Secondary | ICD-10-CM

## 2011-03-09 DIAGNOSIS — R141 Gas pain: Secondary | ICD-10-CM

## 2011-03-09 DIAGNOSIS — R140 Abdominal distension (gaseous): Secondary | ICD-10-CM

## 2011-03-09 DIAGNOSIS — E538 Deficiency of other specified B group vitamins: Secondary | ICD-10-CM

## 2011-03-09 DIAGNOSIS — R82998 Other abnormal findings in urine: Secondary | ICD-10-CM

## 2011-03-09 DIAGNOSIS — N760 Acute vaginitis: Secondary | ICD-10-CM

## 2011-03-09 DIAGNOSIS — R300 Dysuria: Secondary | ICD-10-CM

## 2011-03-09 MED ORDER — CYANOCOBALAMIN 1000 MCG/ML IJ SOLN
1000.0000 ug | Freq: Once | INTRAMUSCULAR | Status: AC
  Administered 2011-03-09: 1000 ug via INTRAMUSCULAR

## 2011-03-09 MED ORDER — TERCONAZOLE 0.4 % VA CREA: 1.0000 | TOPICAL_CREAM | Freq: Every day | VAGINAL | Status: AC

## 2011-03-09 MED ORDER — NITROFURANTOIN MONOHYD MACRO 100 MG PO CAPS: 100.0000 mg | ORAL_CAPSULE | Freq: Two times a day (BID) | ORAL | Status: AC

## 2011-03-09 NOTE — Patient Instructions (Signed)
Prescriptions called in

## 2011-03-09 NOTE — Progress Notes (Signed)
Patient presented to the office today complaining of vulvar irritation and noticing blood when she wiped. She has been menopausal since 2007. When she urinates irritates her on the distal urethra but no true dysuria. She denies any fever chills nausea or vomiting her back pain. Her main other complaint is been bloating sensation in her lower abdomen. She states her bowel movements it otherwise been normal.  Exam: Abdomen: Soft mild suprapubic tenderness no rebound or guarding Bartholin urethra Skene glands: Within normal limits Vagina: No gross lesions on inspection wet prep done sensitive near the distal urethra Uterus: Anteverted difficult to assess due to patient's vaginismus/guarding same as adnexal exam. Rectal: Not done  Urinalysis: 1-2 WBC, 0-2 RBC, trace bacteria Wet prep: Moniliasis  Assessment: We'll treat patient for a suspected urinary tract infection with Macrobid one by mouth twice a day for 7 days. She'll be placed on Terazol 7 to apply external genitalia each bedtime for one week. She'll return back next week for a pelvic ultrasound to better assess her adnexa. Her urine was sent for culture.

## 2011-03-14 ENCOUNTER — Telehealth: Payer: Self-pay | Admitting: *Deleted

## 2011-03-14 NOTE — Telephone Encounter (Signed)
Pt had question about office report sent in mail, all questions answered.

## 2011-03-17 ENCOUNTER — Ambulatory Visit: Payer: BC Managed Care – PPO | Admitting: Gynecology

## 2011-03-17 ENCOUNTER — Other Ambulatory Visit: Payer: BC Managed Care – PPO

## 2011-04-07 ENCOUNTER — Ambulatory Visit (INDEPENDENT_AMBULATORY_CARE_PROVIDER_SITE_OTHER): Payer: BC Managed Care – PPO | Admitting: *Deleted

## 2011-04-07 DIAGNOSIS — E538 Deficiency of other specified B group vitamins: Secondary | ICD-10-CM

## 2011-04-07 MED ORDER — CYANOCOBALAMIN 1000 MCG/ML IJ SOLN
1000.0000 ug | Freq: Once | INTRAMUSCULAR | Status: AC
  Administered 2011-04-07: 1000 ug via INTRAMUSCULAR

## 2011-04-08 ENCOUNTER — Ambulatory Visit: Payer: BC Managed Care – PPO

## 2011-06-10 ENCOUNTER — Encounter: Payer: Self-pay | Admitting: Internal Medicine

## 2011-06-10 ENCOUNTER — Ambulatory Visit (INDEPENDENT_AMBULATORY_CARE_PROVIDER_SITE_OTHER): Payer: BC Managed Care – PPO | Admitting: Obstetrics and Gynecology

## 2011-06-10 ENCOUNTER — Ambulatory Visit (INDEPENDENT_AMBULATORY_CARE_PROVIDER_SITE_OTHER): Payer: BC Managed Care – PPO

## 2011-06-10 DIAGNOSIS — N899 Noninflammatory disorder of vagina, unspecified: Secondary | ICD-10-CM

## 2011-06-10 DIAGNOSIS — N898 Other specified noninflammatory disorders of vagina: Secondary | ICD-10-CM

## 2011-06-10 DIAGNOSIS — M549 Dorsalgia, unspecified: Secondary | ICD-10-CM

## 2011-06-10 DIAGNOSIS — E538 Deficiency of other specified B group vitamins: Secondary | ICD-10-CM

## 2011-06-10 LAB — WET PREP FOR TRICH, YEAST, CLUE
Clue Cells Wet Prep HPF POC: NONE SEEN
WBC, Wet Prep HPF POC: NONE SEEN

## 2011-06-10 LAB — URINALYSIS W MICROSCOPIC + REFLEX CULTURE
Casts: NONE SEEN
Leukocytes, UA: NEGATIVE
Nitrite: NEGATIVE
Specific Gravity, Urine: 1.015 (ref 1.005–1.030)
Urobilinogen, UA: 0.2 mg/dL (ref 0.0–1.0)
pH: 7 (ref 5.0–8.0)

## 2011-06-10 MED ORDER — TERCONAZOLE 0.8 % VA CREA: 1.0000 | TOPICAL_CREAM | Freq: Every day | VAGINAL | Status: AC

## 2011-06-10 MED ORDER — CYANOCOBALAMIN 1000 MCG/ML IJ SOLN
1000.0000 ug | Freq: Once | INTRAMUSCULAR | Status: AC
  Administered 2011-06-10: 1000 ug via INTRAMUSCULAR

## 2011-06-10 NOTE — Progress Notes (Signed)
Patient came to see me today with a history of vulvar and vaginal burning, lower back pain, and lower abdominal bloating. She is menopausal and has had no bleeding. She had been on Cleocin for a tooth infection for one week. She thought she had a yeast infection and has been using Monistat but it has not helped.  Exam: Kennon Portela present. Abdomen is soft without guarding rebound or masses.Pelvic exam: External: Within normal limits. BUS within normal limits. Vaginal exam: some discharge.looks like yeast. Wet prep negative.  Cervix: Clean. Uterus: Within normal limits. Adnexa: Within normal limits. Rectovaginal exam: Within normal limits. Urinalysis negative.  Assessment: Yeast vaginitis. Lower abdominal discomfort.  Plan: Terconazole 3 cream. Urine culture done. If culture negative and abdominal discomfort persists after terconazole she will call and we will schedule ultrasound.

## 2011-06-12 LAB — URINE CULTURE: Colony Count: NO GROWTH

## 2011-06-13 ENCOUNTER — Telehealth: Payer: Self-pay | Admitting: *Deleted

## 2011-06-13 NOTE — Telephone Encounter (Signed)
Pt informed of recent urine culture results. 06/10/11

## 2011-06-17 ENCOUNTER — Ambulatory Visit (INDEPENDENT_AMBULATORY_CARE_PROVIDER_SITE_OTHER): Payer: BC Managed Care – PPO | Admitting: Gynecology

## 2011-06-17 ENCOUNTER — Encounter: Payer: Self-pay | Admitting: Gynecology

## 2011-06-17 ENCOUNTER — Ambulatory Visit (INDEPENDENT_AMBULATORY_CARE_PROVIDER_SITE_OTHER): Payer: BC Managed Care – PPO

## 2011-06-17 VITALS — BP 128/88

## 2011-06-17 DIAGNOSIS — R140 Abdominal distension (gaseous): Secondary | ICD-10-CM

## 2011-06-17 DIAGNOSIS — R102 Pelvic and perineal pain: Secondary | ICD-10-CM

## 2011-06-17 DIAGNOSIS — N949 Unspecified condition associated with female genital organs and menstrual cycle: Secondary | ICD-10-CM

## 2011-06-17 DIAGNOSIS — R141 Gas pain: Secondary | ICD-10-CM

## 2011-06-17 DIAGNOSIS — R143 Flatulence: Secondary | ICD-10-CM

## 2011-06-17 NOTE — Progress Notes (Signed)
Patient presented to the office today for followup ultrasound that was ordered as part of her last office visit. She had just gone off of being on antibiotics (clindamycin) for tooth abscess. She was also treated recently for a yeast infection. At that last office visit she's been complaining of some bloating sensation and low pelvic pressure. Ultrasound today demonstrated the following:  Uterus measured 9.2 x 5.2 x 3.7 cm with an endometrial stripe 4.1 mm normal uterus and ovaries and no free fluid seen in the cul-de-sac.  Patient states that she is no longer having the symptoms. It appears that the symptoms have been a secondary effect of the clindamycin. She is no longer having a yeast infection either. Review of her record indicated that she is due for her annual exam this July and her Pap smear September. She will schedule these accordingly.

## 2011-06-17 NOTE — Patient Instructions (Signed)
Will see you in July for annual exam and mammogram due in September

## 2011-06-22 ENCOUNTER — Encounter: Payer: Self-pay | Admitting: Gynecology

## 2011-07-06 ENCOUNTER — Telehealth: Payer: Self-pay | Admitting: *Deleted

## 2011-07-06 NOTE — Telephone Encounter (Signed)
Rf req Alprazolam 0.25 mg 1 po bid. Ok to Rf?

## 2011-07-06 NOTE — Telephone Encounter (Signed)
OK to fill this prescription with additional refills x2 Thank you!  

## 2011-07-07 MED ORDER — ALPRAZOLAM 0.25 MG PO TABS: 0.2500 mg | ORAL_TABLET | Freq: Two times a day (BID) | ORAL | Status: DC

## 2011-07-07 NOTE — Telephone Encounter (Signed)
Done

## 2011-07-18 ENCOUNTER — Telehealth: Payer: Self-pay | Admitting: *Deleted

## 2011-07-18 ENCOUNTER — Telehealth: Payer: Self-pay

## 2011-07-18 MED ORDER — HYDROCHLOROTHIAZIDE 12.5 MG PO CAPS: 12.5000 mg | ORAL_CAPSULE | Freq: Every day | ORAL | Status: DC | PRN

## 2011-07-18 MED ORDER — FLUCONAZOLE 100 MG PO TABS: 100.0000 mg | ORAL_TABLET | Freq: Once | ORAL | Status: AC

## 2011-07-18 NOTE — Telephone Encounter (Signed)
Can try HCTZ OV in 2 wks Thx

## 2011-07-18 NOTE — Telephone Encounter (Signed)
Pt called c/o irritation in vaginal area, fishy odor x 4 days now, no discharge, OV offered to pt, pt declined asked to me send request to you. Pt has tooth pulled and was on amoxicillin, off medication now. Pt would like rx, please advise

## 2011-07-18 NOTE — Telephone Encounter (Signed)
Pt called requesting MD's advisement on taking a water pill. Pt c/o of feeling bloated, please advise.

## 2011-07-18 NOTE — Telephone Encounter (Signed)
Pt advised of Rx/pharmacy 

## 2011-07-18 NOTE — Telephone Encounter (Signed)
Left the below on pt voicemail, rx sent 

## 2011-07-18 NOTE — Telephone Encounter (Signed)
Please: Diflucan 100 mg one by mouth daily with one refill for apparent moniliasis as a result of antibiotic use. If no resolution of her symptoms the next 2-3 days she does need to come by the office.

## 2011-07-18 NOTE — Telephone Encounter (Signed)
Left message on machine for pt to return my call  

## 2011-08-01 ENCOUNTER — Ambulatory Visit: Payer: BC Managed Care – PPO | Admitting: Internal Medicine

## 2011-08-01 DIAGNOSIS — Z0289 Encounter for other administrative examinations: Secondary | ICD-10-CM

## 2011-08-05 ENCOUNTER — Ambulatory Visit: Payer: BC Managed Care – PPO

## 2011-08-11 ENCOUNTER — Ambulatory Visit (INDEPENDENT_AMBULATORY_CARE_PROVIDER_SITE_OTHER): Payer: BC Managed Care – PPO | Admitting: *Deleted

## 2011-08-11 ENCOUNTER — Other Ambulatory Visit: Payer: Self-pay | Admitting: Internal Medicine

## 2011-08-11 DIAGNOSIS — E538 Deficiency of other specified B group vitamins: Secondary | ICD-10-CM

## 2011-08-11 MED ORDER — CYANOCOBALAMIN 1000 MCG/ML IJ SOLN
1000.0000 ug | Freq: Once | INTRAMUSCULAR | Status: AC
  Administered 2011-08-11: 1000 ug via INTRAMUSCULAR

## 2011-08-17 ENCOUNTER — Ambulatory Visit: Payer: BC Managed Care – PPO

## 2011-08-25 ENCOUNTER — Encounter: Payer: Self-pay | Admitting: Gastroenterology

## 2011-09-22 ENCOUNTER — Ambulatory Visit: Payer: BC Managed Care – PPO

## 2011-09-23 ENCOUNTER — Ambulatory Visit: Payer: BC Managed Care – PPO

## 2011-09-28 ENCOUNTER — Ambulatory Visit (INDEPENDENT_AMBULATORY_CARE_PROVIDER_SITE_OTHER): Payer: BC Managed Care – PPO

## 2011-09-28 DIAGNOSIS — E538 Deficiency of other specified B group vitamins: Secondary | ICD-10-CM

## 2011-09-28 MED ORDER — CYANOCOBALAMIN 1000 MCG/ML IJ SOLN
1000.0000 ug | Freq: Once | INTRAMUSCULAR | Status: AC
  Administered 2011-09-28: 1000 ug via INTRAMUSCULAR

## 2011-10-06 ENCOUNTER — Encounter: Payer: BC Managed Care – PPO | Admitting: Gastroenterology

## 2011-10-31 ENCOUNTER — Telehealth: Payer: Self-pay | Admitting: *Deleted

## 2011-10-31 MED ORDER — ALPRAZOLAM 0.25 MG PO TABS: 0.2500 mg | ORAL_TABLET | Freq: Two times a day (BID) | ORAL | Status: DC

## 2011-10-31 NOTE — Telephone Encounter (Signed)
Patient request refill on medication zannaz. Last OV 12/28/2010

## 2011-10-31 NOTE — Telephone Encounter (Signed)
OK to fill this prescription with additional refills x0 Sch ROV Thank you!  

## 2011-11-01 MED ORDER — ALPRAZOLAM 0.25 MG PO TABS: 0.2500 mg | ORAL_TABLET | Freq: Two times a day (BID) | ORAL | Status: DC

## 2011-11-01 NOTE — Telephone Encounter (Signed)
LEFT MESSAGE ON PATIENT VM OF HOME # OF MEDICATION CALLED TO PHARMACY AND NEED TO SCHEDULE OV WITH DR. Debby Bud FOR FURTHER REFILL ON MEDICATIONS

## 2011-11-09 ENCOUNTER — Ambulatory Visit: Payer: BC Managed Care – PPO | Admitting: Internal Medicine

## 2011-11-30 ENCOUNTER — Encounter: Payer: Self-pay | Admitting: Gynecology

## 2011-12-01 ENCOUNTER — Encounter: Payer: Self-pay | Admitting: Endocrinology

## 2011-12-01 ENCOUNTER — Ambulatory Visit (INDEPENDENT_AMBULATORY_CARE_PROVIDER_SITE_OTHER): Payer: BC Managed Care – PPO | Admitting: Endocrinology

## 2011-12-01 VITALS — BP 114/78 | HR 69 | Temp 98.1°F | Ht 62.0 in | Wt 176.0 lb

## 2011-12-01 DIAGNOSIS — J069 Acute upper respiratory infection, unspecified: Secondary | ICD-10-CM

## 2011-12-01 DIAGNOSIS — E538 Deficiency of other specified B group vitamins: Secondary | ICD-10-CM

## 2011-12-01 MED ORDER — CEFUROXIME AXETIL 250 MG PO TABS: 250.0000 mg | ORAL_TABLET | Freq: Two times a day (BID) | ORAL | Status: AC

## 2011-12-01 MED ORDER — CYANOCOBALAMIN 1000 MCG/ML IJ SOLN
1000.0000 ug | Freq: Once | INTRAMUSCULAR | Status: AC
  Administered 2011-12-01: 1000 ug via INTRAMUSCULAR

## 2011-12-01 NOTE — Progress Notes (Signed)
  Subjective:    [name], female    DOB: Aug 15, 1960, 51 y.o.   MRN: [id number]  HPI Pt states few days of moderate pain at the right ear, and assoc nasal congestion.  She also has right maxillary pain Past Medical History  Diagnosis Date  . Hyperlipidemia   . Cyst     gangeous- left hand  . Tobacco abuse   . Allergy     rhinitis  . Hypothyroidism   . Anxiety   . Depression   . B12 deficiency     Past Surgical History  Procedure Date  . Cesarean section     History   Social History  . Marital Status: Married    Spouse Name: N/A    Number of Children: N/A  . Years of Education: N/A   Occupational History  . Not on file.   Social History Main Topics  . Smoking status: Current Everyday Smoker -- 0.2 packs/day    Types: Cigarettes  . Smokeless tobacco: Not on file  . Alcohol Use: No  . Drug Use: No  . Sexually Active: Yes   Other Topics Concern  . Not on file   Social History Narrative  . No narrative on file    Current Outpatient Prescriptions on File Prior to Visit  Medication Sig Dispense Refill  . ALPRAZolam (XANAX) 0.25 MG tablet Take 1 tablet (0.25 mg total) by mouth 2 (two) times daily.  60 tablet  0  . aspirin 81 MG tablet Take 81 mg by mouth daily.        . cyanocobalamin (,VITAMIN B-12,) 1000 MCG/ML injection Inject 1 mL (1,000 mcg total) into the muscle every 30 (thirty) days. office  10 mL  6  . levothyroxine (SYNTHROID, LEVOTHROID) 75 MCG tablet TAKE 1 TABLET BY MOUTH DAILY  30 tablet  5  . rosuvastatin (CRESTOR) 20 MG tablet Take 1 tablet (20 mg total) by mouth daily.  30 tablet  6  . hydrochlorothiazide (MICROZIDE) 12.5 MG capsule Take 1 capsule (12.5 mg total) by mouth daily as needed.  30 capsule  1  . triamcinolone (KENALOG) 0.5 % cream Apply topically 2 (two) times daily.  90 g  3    Allergies  Allergen Reactions  . Atorvastatin     REACTION: aches  . Ezetimibe-Simvastatin     REACTION: hives    Family History    Problem Relation Age of Onset  . Thyroid disease Mother     Low  . Other Mother     B12 deficiency  . Coronary artery disease Other   . Hyperlipidemia Other   . Heart disease Father 29    CAD    BP 114/78  Pulse 69  Temp 98.1 F (36.7 C) (Oral)  Ht 5\' 2"  (1.575 m)  Wt 176 lb (79.833 kg)  BMI 32.19 kg/m2  SpO2 99%  LMP 03/08/2006    Review of Systems She also has sore throat, but no fever.    Objective:   Physical Exam VITAL SIGNS:  See vs page GENERAL: no distress head: no deformity eyes: no periorbital swelling, no proptosis external nose and ears are normal mouth: no lesion seen Right tm is red.  The left is normal NECK: There is no palpable thyroid enlargement.  No thyroid nodule is palpable.  No palpable lymphadenopathy at the anterior neck. LUNGS:  Clear to auscultation      Assessment & Plan:  URI, new

## 2011-12-01 NOTE — Patient Instructions (Addendum)
i have sent a prescription to your pharmacy, for an antibiotic pill. cetirizine-d (non-prescription) will help your congestion.  I hope you feel better soon.  If you don't feel better by next week, please call back.

## 2011-12-05 ENCOUNTER — Telehealth: Payer: Self-pay | Admitting: Internal Medicine

## 2011-12-05 ENCOUNTER — Telehealth: Payer: Self-pay | Admitting: *Deleted

## 2011-12-05 MED ORDER — FLUCONAZOLE 150 MG PO TABS: ORAL_TABLET | ORAL | Status: AC

## 2011-12-05 NOTE — Telephone Encounter (Signed)
Ok in PCP absence? 

## 2011-12-05 NOTE — Telephone Encounter (Signed)
Please advise in AVP's absence, thanks.

## 2011-12-05 NOTE — Telephone Encounter (Signed)
Caller: [place]/Patient; Patient Name: Vanessa [name]; PCP: Plotnikov, Alex (Adults only); [facility] Callback Phone Number: [phone]; Reason for call: Seen by Dr. Everardo All for Sinus Sx and started on Cipro on 12/01/11. She is having vaginal itching and irritation- onset 12/04/11. No discharge. Sinus sx improved. She is requesting that Cream or Pill to treat yeast be called in to [facility] on [phone] Triage and Care advice per Vaginal Discharge or Irritation Protocol.

## 2011-12-05 NOTE — Telephone Encounter (Signed)
Done per emr 

## 2011-12-05 NOTE — Telephone Encounter (Signed)
The patient called and stated her antibiotic caused her to have a yeast infection.  She is hoping a medicine (vaginal cream) can be called into her pharmacy to help resolve this issue.   Thanks!

## 2011-12-05 NOTE — Telephone Encounter (Signed)
Pt calling yeast infection, left message on voice mail OV best.

## 2011-12-06 MED ORDER — FLUCONAZOLE 150 MG PO TABS: 150.0000 mg | ORAL_TABLET | Freq: Once | ORAL | Status: AC

## 2011-12-06 NOTE — Telephone Encounter (Signed)
Pt informed

## 2011-12-06 NOTE — Telephone Encounter (Signed)
done

## 2011-12-06 NOTE — Telephone Encounter (Signed)
Patient informed. 

## 2011-12-26 ENCOUNTER — Other Ambulatory Visit: Payer: Self-pay | Admitting: Internal Medicine

## 2011-12-26 NOTE — Telephone Encounter (Signed)
Ok to Rf? 

## 2011-12-29 ENCOUNTER — Encounter: Payer: BC Managed Care – PPO | Admitting: Internal Medicine

## 2011-12-29 ENCOUNTER — Other Ambulatory Visit: Payer: Self-pay | Admitting: Internal Medicine

## 2012-01-06 ENCOUNTER — Other Ambulatory Visit (INDEPENDENT_AMBULATORY_CARE_PROVIDER_SITE_OTHER): Payer: BC Managed Care – PPO

## 2012-01-06 ENCOUNTER — Other Ambulatory Visit: Payer: Self-pay | Admitting: *Deleted

## 2012-01-06 DIAGNOSIS — Z0000 Encounter for general adult medical examination without abnormal findings: Secondary | ICD-10-CM

## 2012-01-06 LAB — CBC WITH DIFFERENTIAL/PLATELET
Basophils Relative: 0.9 % (ref 0.0–3.0)
Eosinophils Relative: 4 % (ref 0.0–5.0)
HCT: 41.6 % (ref 36.0–46.0)
Hemoglobin: 13.8 g/dL (ref 12.0–15.0)
Lymphocytes Relative: 28.1 % (ref 12.0–46.0)
Lymphs Abs: 3.2 10*3/uL (ref 0.7–4.0)
Monocytes Relative: 4.8 % (ref 3.0–12.0)
Neutro Abs: 7.1 10*3/uL (ref 1.4–7.7)
RBC: 4.65 Mil/uL (ref 3.87–5.11)
RDW: 13.6 % (ref 11.5–14.6)
WBC: 11.5 10*3/uL — ABNORMAL HIGH (ref 4.5–10.5)

## 2012-01-06 LAB — URINALYSIS, ROUTINE W REFLEX MICROSCOPIC
Bilirubin Urine: NEGATIVE
Ketones, ur: NEGATIVE
Leukocytes, UA: NEGATIVE
Nitrite: NEGATIVE
Specific Gravity, Urine: >=1.03 (ref 1.000–1.030)
pH: 6 (ref 5.0–8.0)

## 2012-01-06 LAB — BASIC METABOLIC PANEL
Calcium: 9 mg/dL (ref 8.4–10.5)
GFR: 89.09 mL/min (ref >60.00)
Potassium: 4.2 mEq/L (ref 3.5–5.1)
Sodium: 142 mEq/L (ref 135–145)

## 2012-01-06 LAB — LIPID PANEL
HDL: 38.3 mg/dL — ABNORMAL LOW (ref >39.00)
LDL Cholesterol: 143 mg/dL — ABNORMAL HIGH (ref 0–99)
Total CHOL/HDL Ratio: 5
VLDL: 16 mg/dL (ref 0.0–40.0)

## 2012-01-06 LAB — TSH: TSH: 2.04 u[IU]/mL (ref 0.35–5.50)

## 2012-01-06 LAB — HEPATIC FUNCTION PANEL
AST: 18 U/L (ref 0–37)
Alkaline Phosphatase: 49 U/L (ref 39–117)
Total Bilirubin: 0.7 mg/dL (ref 0.3–1.2)

## 2012-01-12 ENCOUNTER — Encounter: Payer: Self-pay | Admitting: Internal Medicine

## 2012-01-12 ENCOUNTER — Ambulatory Visit (INDEPENDENT_AMBULATORY_CARE_PROVIDER_SITE_OTHER): Payer: BC Managed Care – PPO | Admitting: Internal Medicine

## 2012-01-12 VITALS — BP 128/90 | HR 80 | Temp 97.9°F | Resp 16 | Ht 62.0 in | Wt 178.0 lb

## 2012-01-12 DIAGNOSIS — F411 Generalized anxiety disorder: Secondary | ICD-10-CM

## 2012-01-12 DIAGNOSIS — F17200 Nicotine dependence, unspecified, uncomplicated: Secondary | ICD-10-CM

## 2012-01-12 DIAGNOSIS — Z0000 Encounter for general adult medical examination without abnormal findings: Secondary | ICD-10-CM

## 2012-01-12 DIAGNOSIS — G4700 Insomnia, unspecified: Secondary | ICD-10-CM

## 2012-01-12 DIAGNOSIS — E538 Deficiency of other specified B group vitamins: Secondary | ICD-10-CM

## 2012-01-12 DIAGNOSIS — Z23 Encounter for immunization: Secondary | ICD-10-CM

## 2012-01-12 DIAGNOSIS — E785 Hyperlipidemia, unspecified: Secondary | ICD-10-CM

## 2012-01-12 MED ORDER — CYANOCOBALAMIN 1000 MCG/ML IJ SOLN
1000.0000 ug | Freq: Once | INTRAMUSCULAR | Status: AC
  Administered 2012-01-12: 1000 ug via INTRAMUSCULAR

## 2012-01-12 MED ORDER — VITAMIN D 1000 UNITS PO TABS: 1000.0000 [IU] | ORAL_TABLET | Freq: Every day | ORAL | Status: DC

## 2012-01-12 MED ORDER — ALPRAZOLAM 0.25 MG PO TABS: 0.2500 mg | ORAL_TABLET | Freq: Two times a day (BID) | ORAL | Status: DC

## 2012-01-12 MED ORDER — CYANOCOBALAMIN 1000 MCG/ML IJ SOLN: 1000.0000 ug | Freq: Once | INTRAMUSCULAR | Status: DC

## 2012-01-12 MED ORDER — ROSUVASTATIN CALCIUM 20 MG PO TABS: 20.0000 mg | ORAL_TABLET | Freq: Every day | ORAL | Status: DC

## 2012-01-12 NOTE — Patient Instructions (Signed)
Wt Readings from Last 3 Encounters:  01/12/12 178 lb (80.74 kg)  12/01/11 176 lb (79.833 kg)  12/28/10 180 lb (81.647 kg)   BP Readings from Last 3 Encounters:  01/12/12 128/90  12/01/11 114/78  06/17/11 128/88

## 2012-01-12 NOTE — Progress Notes (Signed)
Subjective:    [name], female    DOB: 1961/03/09, 51 y.o.   MRN: [id number]  HPI [name] is here for a wellness exam. [name] has been doing well overall without major physical or psychological issues going on lately.  Wt Readings from Last 3 Encounters:  01/12/12 178 lb (80.74 kg)  12/01/11 176 lb (79.833 kg)  12/28/10 180 lb (81.647 kg)   BP Readings from Last 3 Encounters:  01/12/12 128/90  12/01/11 114/78  06/17/11 128/88     Review of Systems  Constitutional: Negative.  Negative for fever, chills, diaphoresis, activity change, appetite change, fatigue and unexpected weight change.  HENT: Negative for hearing loss, ear pain, nosebleeds, congestion, sore throat, facial swelling, rhinorrhea, sneezing, mouth sores, trouble swallowing, neck pain, neck stiffness, postnasal drip, sinus pressure and tinnitus.   Eyes: Negative for pain, discharge, redness, itching and visual disturbance.  Respiratory: Negative for cough, chest tightness, shortness of breath, wheezing and stridor.   Cardiovascular: Negative for chest pain, palpitations and leg swelling.  Gastrointestinal: Negative for nausea, diarrhea, constipation, blood in stool, abdominal distention, anal bleeding and rectal pain.  Genitourinary: Negative for dysuria, urgency, frequency, hematuria, flank pain, vaginal bleeding, vaginal discharge, difficulty urinating, genital sores, vaginal pain and pelvic pain.  Musculoskeletal: Negative for back pain, joint swelling, arthralgias and gait problem.  Skin: Negative.  Negative for rash.  Neurological: Negative for dizziness, tremors, seizures, syncope, speech difficulty, weakness, numbness and headaches.  Hematological: Negative for adenopathy. Does not bruise/bleed easily.  Psychiatric/Behavioral: Negative for suicidal ideas, behavioral problems, disturbed wake/sleep cycle, dysphoric mood and decreased concentration. [name] is not nervous/anxious.         Objective:   Physical Exam  Constitutional: She appears well-developed. No distress.       obese  HENT:  Head: Normocephalic.  Right Ear: External ear normal.  Left Ear: External ear normal.  Nose: Nose normal.  Mouth/Throat: Oropharynx is clear and moist.  Eyes: Conjunctivae normal are normal. Pupils are equal, round, and reactive to light. Right eye exhibits no discharge. Left eye exhibits no discharge.  Neck: Normal range of motion. Neck supple. No JVD present. No tracheal deviation present. No thyromegaly present.  Cardiovascular: Normal rate, regular rhythm and normal heart sounds.   Pulmonary/Chest: No stridor. No respiratory distress. She has no wheezes.  Abdominal: Soft. Bowel sounds are normal. She exhibits no distension and no mass. There is no tenderness. There is no rebound and no guarding.  Musculoskeletal: She exhibits no edema and no tenderness.  Lymphadenopathy:    She has no cervical adenopathy.  Neurological: She displays normal reflexes. No cranial nerve deficit. She exhibits normal muscle tone. Coordination normal.  Skin: No rash noted. No erythema.  Psychiatric: She has a normal mood and affect. Her behavior is normal. Judgment and thought content normal.   Lab Results  Component Value Date   WBC 11.5* 01/06/2012   HGB 13.8 01/06/2012   HCT 41.6 01/06/2012   PLT 326.0 01/06/2012   GLUCOSE 88 01/06/2012   CHOL 197 01/06/2012   TRIG 80.0 01/06/2012   HDL 38.30* 01/06/2012   LDLDIRECT 246.5 10/16/2009   LDLCALC 143* 01/06/2012   ALT 16 01/06/2012   AST 18 01/06/2012   NA 142 01/06/2012   K 4.2 01/06/2012   CL 108 01/06/2012   CREATININE 0.7 01/06/2012   BUN 19 01/06/2012   CO2 26 01/06/2012   TSH 2.04 01/06/2012  Assessment & Plan:

## 2012-01-12 NOTE — Assessment & Plan Note (Signed)
Trying to reduce

## 2012-01-15 NOTE — Assessment & Plan Note (Signed)
Alprazolam prn Continue with current prescription therapy as reflected on the Med list.

## 2012-01-15 NOTE — Assessment & Plan Note (Addendum)
  We discussed age appropriate health related issues, including available/recomended screening tests and vaccinations. We discussed a need for adhering to healthy diet and exercise. Labs/EKG were reviewed/ordered. All questions were answered. Colonoscopy, Zostavax suggested

## 2012-01-16 NOTE — Assessment & Plan Note (Signed)
Continue with current prescription therapy as reflected on the Med list.  

## 2012-01-17 ENCOUNTER — Telehealth: Payer: Self-pay | Admitting: Internal Medicine

## 2012-01-17 DIAGNOSIS — Z0000 Encounter for general adult medical examination without abnormal findings: Secondary | ICD-10-CM

## 2012-01-17 NOTE — Telephone Encounter (Signed)
Caller: [place]/Patient; Phone: [phone]; Reason for Call: Caller: [place]ain[place]/Patient; Patient Name: Vanessa [name]; PCP: Sonda Primes (Adults only); [facility] Callback Phone Number: [phone]  Patient states she was seen by Dr.  Posey Rea 01/12/12 for annual physical.  States she had labwork and EKG.  Patient states she wanted to request that a chest xray be done as part of her Physical.  Patient denies having any problems.   PATIENT REQUESTING THAT A CHEST XRAY BE ORDERED AS PART OF HER ROUTINE PHYSICAL.  PATIENT CAN BE REACHED AT [phone] UNTIL 1630 01/17/12.  PATIENT CAN BE REACHED AT [phone] AFTER 1630 01/17/12.

## 2012-01-19 NOTE — Telephone Encounter (Signed)
OK (done). Pl note: Many ins co do not pay for wellness CXR because they don't think doing  CXR is justified fr pt's w/o sx's. Thx

## 2012-01-19 NOTE — Telephone Encounter (Signed)
Left message for pt to callback office.  

## 2012-01-19 NOTE — Telephone Encounter (Signed)
Pt informed of order for chest xray.

## 2012-02-11 ENCOUNTER — Other Ambulatory Visit: Payer: Self-pay | Admitting: Internal Medicine

## 2012-03-30 ENCOUNTER — Encounter: Payer: Self-pay | Admitting: Internal Medicine

## 2012-03-30 ENCOUNTER — Ambulatory Visit (INDEPENDENT_AMBULATORY_CARE_PROVIDER_SITE_OTHER): Payer: BC Managed Care – PPO | Admitting: Internal Medicine

## 2012-03-30 VITALS — BP 126/80 | HR 77 | Temp 97.5°F | Ht 62.0 in | Wt 180.0 lb

## 2012-03-30 DIAGNOSIS — E538 Deficiency of other specified B group vitamins: Secondary | ICD-10-CM

## 2012-03-30 DIAGNOSIS — H6691 Otitis media, unspecified, right ear: Secondary | ICD-10-CM

## 2012-03-30 DIAGNOSIS — H6690 Otitis media, unspecified, unspecified ear: Secondary | ICD-10-CM

## 2012-03-30 MED ORDER — AMOXICILLIN-POT CLAVULANATE 875-125 MG PO TABS: 1.0000 | ORAL_TABLET | Freq: Two times a day (BID) | ORAL | Status: DC

## 2012-03-30 MED ORDER — CYANOCOBALAMIN 1000 MCG/ML IJ SOLN
1000.0000 ug | Freq: Once | INTRAMUSCULAR | Status: AC
  Administered 2012-03-30: 1000 ug via INTRAMUSCULAR

## 2012-03-30 NOTE — Addendum Note (Signed)
Addended by: Brenton Grills C on: 03/30/2012 04:09 PM   Modules accepted: Orders

## 2012-03-30 NOTE — Patient Instructions (Signed)

## 2012-03-30 NOTE — Progress Notes (Signed)
Subjective:    [name], female    DOB: June 15, 1960, 52 y.o.   MRN: [id number]  HPI  [name] presents to the clinic today with c/o a sore throat and right ear pain x 4 days. She has taken ibuprofen and teaspoons of honey without much relief. She does feel fatigued.She has been taking ibuprofen which has helped the ear pain. She denies fever, chills or body aches. She has not had sick contacts that she knows of.  Review of Systems  Past Medical History  Diagnosis Date  . Hyperlipidemia   . Cyst     gangeous- left hand  . Tobacco abuse   . Allergy     rhinitis  . Hypothyroidism   . Anxiety   . Depression   . B12 deficiency     Current Outpatient Prescriptions  Medication Sig Dispense Refill  . ALPRAZolam (XANAX) 0.25 MG tablet Take 1 tablet (0.25 mg total) by mouth 2 (two) times daily.  60 tablet  0  . aspirin 81 MG tablet Take 81 mg by mouth daily.        . cholecalciferol (VITAMIN D) 1000 UNITS tablet Take 1 tablet (1,000 Units total) by mouth daily.  100 tablet  3  . cyanocobalamin (,VITAMIN B-12,) 1000 MCG/ML injection Inject 1 mL (1,000 mcg total) into the muscle every 30 (thirty) days. office  10 mL  6  . hydrochlorothiazide (MICROZIDE) 12.5 MG capsule Take 1 capsule (12.5 mg total) by mouth daily as needed.  30 capsule  1  . levothyroxine (SYNTHROID, LEVOTHROID) 75 MCG tablet TAKE 1 TABLET BY MOUTH DAILY  30 tablet  5  . rosuvastatin (CRESTOR) 20 MG tablet Take 1 tablet (20 mg total) by mouth daily.  30 tablet  11  . triamcinolone (KENALOG) 0.5 % cream Apply topically 2 (two) times daily.  90 g  3    Allergies  Allergen Reactions  . Atorvastatin     REACTION: aches  . Ezetimibe-Simvastatin     REACTION: hives    Family History  Problem Relation Age of Onset  . Thyroid disease Mother     Low  . Other Mother     B12 deficiency  . Coronary artery disease Other   . Hyperlipidemia Other   . Heart disease Father 7    CAD    History   Social  History  . Marital Status: Married    Spouse Name: N/A    Number of Children: N/A  . Years of Education: N/A   Occupational History  . Not on file.   Social History Main Topics  . Smoking status: Current Every Day Smoker -- 0.2 packs/day    Types: Cigarettes  . Smokeless tobacco: Not on file  . Alcohol Use: No  . Drug Use: No  . Sexually Active: Yes   Other Topics Concern  . Not on file   Social History Narrative  . No narrative on file     Constitutional: Denies fever, malaise, fatigue, headache or abrupt weight changes.  HEENT: Pt reports sore throat and right ear pain.. Denies eye pain, eye redness,  ringing in the ears, wax buildup, runny nose, nasal congestion, bloody nose, or sore throat. Respiratory: Denies difficulty breathing, shortness of breath, cough or sputum production.    No other specific complaints in a complete review of systems (except as listed in HPI above).     Objective:   Physical Exam   BP 126/80  Pulse 77  Temp  97.5 F (36.4 C) (Oral)  Ht 5\' 2"  (1.575 m)  Wt 180 lb (81.647 kg)  BMI 32.92 kg/m2  SpO2 96%  LMP 03/08/2006 Wt Readings from Last 3 Encounters:  03/30/12 180 lb (81.647 kg)  01/12/12 178 lb (80.74 kg)  12/01/11 176 lb (79.833 kg)    General: Appears her stated age, well developed, well nourished in NAD. HEENT: Head: normal shape and size; Eyes: sclera white, no icterus, conjunctiva pink, PERRLA and EOMs intact; Ears: Tm's red and intact, distorted light reflex + effusion; Nose: mucosa pink and moist, septum midline; Throat/Mouth: Teeth present, mucosa pink and moist, no exudate, lesions or ulcerations noted.  Neck: Normal range of motion. Neck supple, trachea midline. No massses, lumps or thyromegaly present.  Cardiovascular: Normal rate and rhythm. S1,S2 noted.  No murmur, rubs or gallops noted. No JVD or BLE edema. No carotid bruits noted. Pulmonary/Chest: Normal effort and positive vesicular breath sounds. No respiratory  distress. No wheezes, rales or ronchi noted.        Assessment & Plan:   Right Otitis Media with Effusion, new onset with additional workup required:  Ibuprofen as needed for pain Continue to take zyrtec daily eRx given for Augmentin BID x 10 days B12 shot today  RTC as needed or if symptoms persist

## 2012-04-07 ENCOUNTER — Other Ambulatory Visit: Payer: Self-pay | Admitting: Internal Medicine

## 2012-04-09 ENCOUNTER — Telehealth: Payer: Self-pay | Admitting: Internal Medicine

## 2012-04-09 NOTE — Telephone Encounter (Signed)
Was seen in office 01-12-12 and given script for Xanax. Tought [facility] but they do not have script. Patient does not have paper script. [facility] [address] PLEASE CALL AND ADVISE IF MD WILL REFILL MEDICINE HOME [phone] OR CELL [phone].

## 2012-04-09 NOTE — Telephone Encounter (Signed)
Patient is requesting a refill on her xanax, call when ready or fax to pharmacy

## 2012-04-09 NOTE — Telephone Encounter (Signed)
OK to fill this prescription with additional refills x2  Pls phone in Thank you!

## 2012-04-10 MED ORDER — ALPRAZOLAM 0.25 MG PO TABS: 0.2500 mg | ORAL_TABLET | Freq: Two times a day (BID) | ORAL | Status: DC

## 2012-04-10 NOTE — Telephone Encounter (Signed)
Done. Pt informed.

## 2012-05-17 ENCOUNTER — Ambulatory Visit (INDEPENDENT_AMBULATORY_CARE_PROVIDER_SITE_OTHER): Payer: BC Managed Care – PPO | Admitting: *Deleted

## 2012-05-17 DIAGNOSIS — E538 Deficiency of other specified B group vitamins: Secondary | ICD-10-CM

## 2012-05-17 MED ORDER — CYANOCOBALAMIN 1000 MCG/ML IJ SOLN
1000.0000 ug | Freq: Once | INTRAMUSCULAR | Status: AC
  Administered 2012-05-17: 1000 ug via INTRAMUSCULAR

## 2012-06-12 ENCOUNTER — Encounter: Payer: Self-pay | Admitting: Gastroenterology

## 2012-07-15 ENCOUNTER — Other Ambulatory Visit: Payer: Self-pay | Admitting: Internal Medicine

## 2012-07-20 ENCOUNTER — Ambulatory Visit (AMBULATORY_SURGERY_CENTER): Payer: BC Managed Care – PPO | Admitting: *Deleted

## 2012-07-20 VITALS — Ht 62.0 in | Wt 178.0 lb

## 2012-07-20 DIAGNOSIS — Z1211 Encounter for screening for malignant neoplasm of colon: Secondary | ICD-10-CM

## 2012-07-20 MED ORDER — NA SULFATE-K SULFATE-MG SULF 17.5-3.13-1.6 GM/177ML PO SOLN: ORAL | Status: DC

## 2012-07-23 ENCOUNTER — Encounter: Payer: Self-pay | Admitting: Gastroenterology

## 2012-08-02 ENCOUNTER — Other Ambulatory Visit: Payer: Self-pay | Admitting: Internal Medicine

## 2012-08-02 ENCOUNTER — Ambulatory Visit: Payer: BC Managed Care – PPO

## 2012-08-02 NOTE — Telephone Encounter (Signed)
Pt called regarding her refill on her Xanax. I called her back and LM that we are waiting on Dr Plotnikov's okay on this.

## 2012-08-03 ENCOUNTER — Other Ambulatory Visit: Payer: Self-pay | Admitting: Internal Medicine

## 2012-08-03 ENCOUNTER — Ambulatory Visit (AMBULATORY_SURGERY_CENTER): Payer: BC Managed Care – PPO | Admitting: Gastroenterology

## 2012-08-03 ENCOUNTER — Encounter: Payer: Self-pay | Admitting: Gastroenterology

## 2012-08-03 VITALS — BP 122/81 | HR 67 | Temp 97.6°F | Resp 12 | Ht 62.0 in | Wt 178.0 lb

## 2012-08-03 DIAGNOSIS — D126 Benign neoplasm of colon, unspecified: Secondary | ICD-10-CM

## 2012-08-03 DIAGNOSIS — Z1211 Encounter for screening for malignant neoplasm of colon: Secondary | ICD-10-CM

## 2012-08-03 MED ORDER — SODIUM CHLORIDE 0.9 % IV SOLN: 500.0000 mL | INTRAVENOUS | Status: DC

## 2012-08-03 NOTE — Progress Notes (Signed)
Patient did not experience any of the following events: a burn prior to discharge; a fall within the facility; wrong site/side/patient/procedure/implant event; or a hospital transfer or hospital admission upon discharge from the facility. (G8907) Patient did not have preoperative order for IV antibiotic SSI prophylaxis. (G8918)  

## 2012-08-03 NOTE — Progress Notes (Signed)
Called to room to assist during endoscopic procedure.  Patient ID and intended procedure confirmed with present staff. Received instructions for my participation in the procedure from the performing physician. ewm 

## 2012-08-03 NOTE — Progress Notes (Signed)
Documentation charted by Felecia Shelling

## 2012-08-03 NOTE — Progress Notes (Signed)
Report to pacu rn, vss, bbs=clear 

## 2012-08-03 NOTE — Op Note (Addendum)
 [facility] [address] [place], [address]   COLONOSCOPY PROCEDURE REPORT  PATIENT: Vanessa [name], Vanessa [name]  MR#: [id number] BIRTHDATE: [birth year] , 52  yrs. old GENDER: Female ENDOSCOPIST: Louis Meckel, MD REFERRED AO:ZHYQ Buckner Malta, M.D. PROCEDURE DATE:  08/03/2012 PROCEDURE:   Colonoscopy with snare polypectomy ASA CLASS:   Class II INDICATIONS:average risk screening. MEDICATIONS: MAC sedation, administered by CRNA and propofol (Diprivan) 200mg  IV  DESCRIPTION OF PROCEDURE:   After the risks benefits and alternatives of the procedure were thoroughly explained, informed consent was obtained.  A digital rectal exam revealed no abnormalities of the rectum.   The LB CF-[id number]  endoscope was introduced through the anus and advanced to the cecum, which was identified by both the appendix and ileocecal valve. No adverse events experienced.   The quality of the prep was excellent using Suprep  The instrument was then slowly withdrawn as the colon was fully examined.      COLON FINDINGS: A sessile polyp was found in the sigmoid colon.  A polypectomy was performed with a cold snare.  The resection was complete and the polyp tissue was completely retrieved.   The colon mucosa was otherwise normal.  Retroflexed views revealed no abnormalities. The time to cecum=4 minutes 12 seconds.  Withdrawal time=6 minutes 20 seconds.  The scope was withdrawn and the procedure completed. COMPLICATIONS: There were no complications.  ENDOSCOPIC IMPRESSION: 1.   Sessile polyp was found in the sigmoid colon; polypectomy was performed with a cold snare 2.   The colon mucosa was otherwise normal  RECOMMENDATIONS: If the polyp(s) removed today are proven to be adenomatous (pre-cancerous) polyps, you will need a repeat colonoscopy in 5 years.  Otherwise you should continue to follow colorectal cancer screening guidelines for "routine risk" patients with colonoscopy in  10 years.  You will receive a letter within 1-2 weeks with the results of your biopsy as well as final recommendations.  Please call my office if you have not received a letter after 3 weeks.   eSigned:  Louis Meckel, MD 08/03/2012 9:21 AM   cc:

## 2012-08-03 NOTE — Patient Instructions (Addendum)

## 2012-08-06 ENCOUNTER — Telehealth: Payer: Self-pay

## 2012-08-06 NOTE — Telephone Encounter (Signed)
  Follow up Call-  Call back number 08/03/2012  Post procedure Call Back phone  # [phone]  Permission to leave phone message Yes     Patient questions:  Do you have a fever, pain , or abdominal swelling? no Pain Score  0 *  Have you tolerated food without any problems? yes  Have you been able to return to your normal activities? yes  Do you have any questions about your discharge instructions: Diet   no Medications  no Follow up visit  no  Do you have questions or concerns about your Care? no  Actions: * If pain score is 4 or above: No action needed, pain <4.   No problems noted from the pt. Maw

## 2012-08-08 ENCOUNTER — Encounter: Payer: Self-pay | Admitting: Gastroenterology

## 2012-08-09 ENCOUNTER — Ambulatory Visit (INDEPENDENT_AMBULATORY_CARE_PROVIDER_SITE_OTHER): Payer: BC Managed Care – PPO | Admitting: *Deleted

## 2012-08-09 DIAGNOSIS — E538 Deficiency of other specified B group vitamins: Secondary | ICD-10-CM

## 2012-08-09 MED ORDER — CYANOCOBALAMIN 1000 MCG/ML IJ SOLN
1000.0000 ug | Freq: Once | INTRAMUSCULAR | Status: AC
  Administered 2012-08-09: 1000 ug via INTRAMUSCULAR

## 2012-08-30 ENCOUNTER — Encounter: Payer: Self-pay | Admitting: Internal Medicine

## 2012-08-30 ENCOUNTER — Ambulatory Visit (INDEPENDENT_AMBULATORY_CARE_PROVIDER_SITE_OTHER): Payer: BC Managed Care – PPO | Admitting: Internal Medicine

## 2012-08-30 ENCOUNTER — Encounter: Payer: Self-pay | Admitting: *Deleted

## 2012-08-30 VITALS — BP 128/88 | HR 76 | Temp 98.0°F | Ht 62.0 in | Wt 175.0 lb

## 2012-08-30 DIAGNOSIS — J309 Allergic rhinitis, unspecified: Secondary | ICD-10-CM

## 2012-08-30 DIAGNOSIS — E538 Deficiency of other specified B group vitamins: Secondary | ICD-10-CM

## 2012-08-30 MED ORDER — CYANOCOBALAMIN 1000 MCG/ML IJ SOLN: 1000.0000 ug | Freq: Once | INTRAMUSCULAR | Status: DC

## 2012-08-30 MED ORDER — CYANOCOBALAMIN 1000 MCG/ML IJ SOLN
1000.0000 ug | Freq: Once | INTRAMUSCULAR | Status: AC
  Administered 2012-08-30: 1000 ug via INTRAMUSCULAR

## 2012-08-30 MED ORDER — METHYLPREDNISOLONE ACETATE 80 MG/ML IJ SUSP
80.0000 mg | Freq: Once | INTRAMUSCULAR | Status: AC
  Administered 2012-08-30: 80 mg via INTRAMUSCULAR

## 2012-08-30 NOTE — Patient Instructions (Addendum)
Allergic Rhinitis Allergic rhinitis is when the mucous membranes in the nose respond to allergens. Allergens are particles in the air that cause your body to have an allergic reaction. This causes you to release allergic antibodies. Through a chain of events, these eventually cause you to release histamine into the blood stream (hence the use of antihistamines). Although meant to be protective to the body, it is this release that causes your discomfort, such as frequent sneezing, congestion and an itchy runny nose.  CAUSES  The pollen allergens may come from grasses, trees, and weeds. This is seasonal allergic rhinitis, or "hay fever." Other allergens cause year-round allergic rhinitis (perennial allergic rhinitis) such as house dust mite allergen, pet dander and mold spores.  SYMPTOMS   Nasal stuffiness (congestion).  Runny, itchy nose with sneezing and tearing of the eyes.  There is often an itching of the mouth, eyes and ears. It cannot be cured, but it can be controlled with medications. DIAGNOSIS  If you are unable to determine the offending allergen, skin or blood testing may find it. TREATMENT   Avoid the allergen.  Medications and allergy shots (immunotherapy) can help.  Hay fever may often be treated with antihistamines in pill or nasal spray forms. Antihistamines block the effects of histamine. There are over-the-counter medicines that may help with nasal congestion and swelling around the eyes. Check with your caregiver before taking or giving this medicine. If the treatment above does not work, there are many new medications your caregiver can prescribe. Stronger medications may be used if initial measures are ineffective. Desensitizing injections can be used if medications and avoidance fails. Desensitization is when a patient is given ongoing shots until the body becomes less sensitive to the allergen. Make sure you follow up with your caregiver if problems continue. SEEK MEDICAL  CARE IF:   You develop fever (more than 100.5 F (38.1 C).  You develop a cough that does not stop easily (persistent).  You have shortness of breath.  You start wheezing.  Symptoms interfere with normal daily activities. Document Released: 12/07/2000 Document Revised: 06/06/2011 Document Reviewed: 06/18/2008 Exit[employer] Patient Information 2014 [employer].  

## 2012-08-30 NOTE — Addendum Note (Signed)
Addended by: Carin Primrose on: 08/30/2012 04:15 PM   Modules accepted: Orders

## 2012-08-30 NOTE — Progress Notes (Signed)
HPI  Pt presents to the clinic today with c/o fatigue, nasal congestion and scratchy throat. This started 4 days ago. She started taking Mucinex and Zyrtec on Monday. The symptoms are not seeming to get better. She does have a history of allergies and asthma. She has not had sick contacts.  Review of Systems    Past Medical History  Diagnosis Date  . Hyperlipidemia   . Cyst     gangeous- left hand  . Tobacco abuse   . Allergy     rhinitis  . Hypothyroidism   . Anxiety   . Depression   . B12 deficiency     Family History  Problem Relation Age of Onset  . Thyroid disease Mother     Low  . Other Mother     B12 deficiency  . Coronary artery disease Other   . Hyperlipidemia Other   . Heart disease Father 26    CAD  . Colon cancer Neg Hx     History   Social History  . Marital Status: Married    Spouse Name: N/A    Number of Children: N/A  . Years of Education: N/A   Occupational History  . Not on file.   Social History Main Topics  . Smoking status: Current Every Day Smoker -- 0.25 packs/day    Types: Cigarettes  . Smokeless tobacco: Never Used  . Alcohol Use: No  . Drug Use: No  . Sexually Active: Yes   Other Topics Concern  . Not on file   Social History Narrative  . No narrative on file    Allergies  Allergen Reactions  . Atorvastatin     REACTION: aches  . Ezetimibe-Simvastatin     REACTION: hives     Constitutional: Positive headache, fatigue. Denies fever or abrupt weight changes.  HEENT:  Positive eye pain, pressure behind the eyes, facial pain, nasal congestion and sore throat. Denies eye redness, ear pain, ringing in the ears, wax buildup, runny nose or bloody nose. Respiratory: Positive cough. Denies difficulty breathing or shortness of breath.  Cardiovascular: Denies chest pain, chest tightness, palpitations or swelling in the hands or feet.   No other specific complaints in a complete review of systems (except as listed in HPI  above).  Objective:    BP 128/88  Pulse 76  Temp(Src) 98 F (36.7 C) (Oral)  Ht 5\' 2"  (1.575 m)  Wt 175 lb (79.379 kg)  BMI 32 kg/m2  SpO2 98%  LMP 03/08/2006 Wt Readings from Last 3 Encounters:  08/30/12 175 lb (79.379 kg)  08/03/12 178 lb (80.74 kg)  07/20/12 178 lb (80.74 kg)    General: Appears his stated age, well developed, well nourished in NAD. HEENT: Head: normal shape and size; Eyes: sclera white, no icterus, conjunctiva pink, PERRLA and EOMs intact; Ears: Tm's gray and intact, normal light reflex; Nose: mucosa pink and moist, septum midline; Throat/Mouth: + PND. Teeth present, mucosa pink and moist, no exudate noted, no lesions or ulcerations noted.  Neck: Mild cervical lymphadenopathy. Neck supple, trachea midline. No massses, lumps or thyromegaly present.  Cardiovascular: Normal rate and rhythm. S1,S2 noted.  No murmur, rubs or gallops noted. No JVD or BLE edema. No carotid bruits noted. Pulmonary/Chest: Normal effort and positive vesicular breath sounds. No respiratory distress. No wheezes, rales or ronchi noted.      Assessment & Plan:   Allergic rhinitis, new onset  Can use a Neti Pot which can be purchased from your local drug  store. Continue Zyrtec and Mucinex 80 mg Depo IM  RTC as needed or if symptoms persist.

## 2012-09-10 ENCOUNTER — Ambulatory Visit: Payer: BC Managed Care – PPO | Admitting: Internal Medicine

## 2012-09-11 ENCOUNTER — Encounter: Payer: Self-pay | Admitting: Internal Medicine

## 2012-09-11 ENCOUNTER — Ambulatory Visit (INDEPENDENT_AMBULATORY_CARE_PROVIDER_SITE_OTHER)
Admission: RE | Admit: 2012-09-11 | Discharge: 2012-09-11 | Disposition: A | Discharge status: Home / Self Care | Payer: BC Managed Care – PPO | Source: Ambulatory Visit | Attending: Internal Medicine | Admitting: Internal Medicine

## 2012-09-11 ENCOUNTER — Ambulatory Visit (INDEPENDENT_AMBULATORY_CARE_PROVIDER_SITE_OTHER): Payer: BC Managed Care – PPO | Admitting: Internal Medicine

## 2012-09-11 ENCOUNTER — Other Ambulatory Visit (INDEPENDENT_AMBULATORY_CARE_PROVIDER_SITE_OTHER): Payer: BC Managed Care – PPO

## 2012-09-11 VITALS — BP 130/92 | HR 76 | Temp 98.6°F | Resp 16 | Wt 178.0 lb

## 2012-09-11 DIAGNOSIS — R071 Chest pain on breathing: Secondary | ICD-10-CM

## 2012-09-11 DIAGNOSIS — R0789 Other chest pain: Secondary | ICD-10-CM

## 2012-09-11 LAB — URINALYSIS
Hgb urine dipstick: NEGATIVE
Ketones, ur: NEGATIVE
Specific Gravity, Urine: <=1.005 (ref 1.000–1.030)
Total Protein, Urine: NEGATIVE
Urine Glucose: NEGATIVE
Urobilinogen, UA: 0.2 (ref 0.0–1.0)

## 2012-09-11 MED ORDER — HYDROCODONE-ACETAMINOPHEN 7.5-325 MG PO TABS: 0.5000 | ORAL_TABLET | Freq: Four times a day (QID) | ORAL | Status: DC | PRN

## 2012-09-11 MED ORDER — IBUPROFEN 600 MG PO TABS: 600.0000 mg | ORAL_TABLET | Freq: Three times a day (TID) | ORAL | Status: DC | PRN

## 2012-09-11 MED ORDER — KETOROLAC TROMETHAMINE 60 MG/2ML IM SOLN
60.0000 mg | Freq: Once | INTRAMUSCULAR | Status: AC
  Administered 2012-09-11: 60 mg via INTRAMUSCULAR

## 2012-09-11 NOTE — Assessment & Plan Note (Signed)
Rib xray Ice/heat Norco prn Ibuprof prn

## 2012-09-11 NOTE — Progress Notes (Signed)
Subjective:     Back Pain This is a new problem. The current episode started in the past 7 days (3 d ago after yard work). The problem occurs constantly. The problem has been rapidly worsening since onset. The pain is present in the thoracic spine (under R shoulder blade). The quality of the pain is described as stabbing. The pain does not radiate. The pain is at a severity of 9/10. The pain is severe. The symptoms are aggravated by sitting. Pertinent negatives include no chest pain, dysuria, fever, headaches, numbness, pelvic pain or weakness. She has tried analgesics, heat and NSAIDs for the symptoms. The treatment provided mild relief.   Wt Readings from Last 3 Encounters:  09/11/12 178 lb (80.74 kg)  08/30/12 175 lb (79.379 kg)  08/03/12 178 lb (80.74 kg)   BP Readings from Last 3 Encounters:  09/11/12 130/92  08/30/12 128/88  08/03/12 122/81     Review of Systems  Constitutional: Negative.  Negative for fever, chills, diaphoresis, activity change, appetite change, fatigue and unexpected weight change.  HENT: Negative for hearing loss, ear pain, nosebleeds, congestion, sore throat, facial swelling, rhinorrhea, sneezing, mouth sores, trouble swallowing, neck pain, neck stiffness, postnasal drip, sinus pressure and tinnitus.   Eyes: Negative for pain, discharge, redness, itching and visual disturbance.  Respiratory: Negative for cough, chest tightness, shortness of breath, wheezing and stridor.   Cardiovascular: Negative for chest pain, palpitations and leg swelling.  Gastrointestinal: Negative for nausea, diarrhea, constipation, blood in stool, abdominal distention, anal bleeding and rectal pain.  Genitourinary: Negative for dysuria, urgency, frequency, hematuria, flank pain, vaginal bleeding, vaginal discharge, difficulty urinating, genital sores, vaginal pain and pelvic pain.  Musculoskeletal: Positive for back pain. Negative for joint swelling, arthralgias and gait problem.   Skin: Negative.  Negative for rash.  Neurological: Negative for dizziness, tremors, seizures, syncope, speech difficulty, weakness, numbness and headaches.  Hematological: Negative for adenopathy. Does not bruise/bleed easily.  Psychiatric/Behavioral: Negative for suicidal ideas, behavioral problems, sleep disturbance, dysphoric mood and decreased concentration. The patient is not nervous/anxious.        Objective:   Physical Exam  Constitutional: She appears well-developed. No distress.  obese  HENT:  Head: Normocephalic.  Right Ear: External ear normal.  Left Ear: External ear normal.  Nose: Nose normal.  Mouth/Throat: Oropharynx is clear and moist.  Eyes: Conjunctivae are normal. Pupils are equal, round, and reactive to light. Right eye exhibits no discharge. Left eye exhibits no discharge.  Neck: Normal range of motion. Neck supple. No JVD present. No tracheal deviation present. No thyromegaly present.  Cardiovascular: Normal rate, regular rhythm and normal heart sounds.   Pulmonary/Chest: No stridor. No respiratory distress. She has no wheezes.  Abdominal: Soft. Bowel sounds are normal. She exhibits no distension and no mass. There is no tenderness. There is no rebound and no guarding.  Musculoskeletal: She exhibits tenderness (Tender under R scapula). She exhibits no edema.  Lymphadenopathy:    She has no cervical adenopathy.  Neurological: She displays normal reflexes. No cranial nerve deficit. She exhibits normal muscle tone. Coordination normal.  Skin: No rash noted. No erythema.  Psychiatric: She has a normal mood and affect. Her behavior is normal. Judgment and thought content normal.   Lab Results  Component Value Date   WBC 11.5* 01/06/2012   HGB 13.8 01/06/2012   HCT 41.6 01/06/2012   PLT 326.0 01/06/2012   GLUCOSE 88 01/06/2012   CHOL 197 01/06/2012   TRIG 80.0 01/06/2012   HDL  38.30* 01/06/2012   LDLDIRECT 246.5 10/16/2009   LDLCALC 143* 01/06/2012   ALT 16  01/06/2012   AST 18 01/06/2012   NA 142 01/06/2012   K 4.2 01/06/2012   CL 108 01/06/2012   CREATININE 0.7 01/06/2012   BUN 19 01/06/2012   CO2 26 01/06/2012   TSH 2.04 01/06/2012          Assessment & Plan:

## 2012-10-15 ENCOUNTER — Encounter: Payer: Self-pay | Admitting: Gynecology

## 2012-11-07 ENCOUNTER — Telehealth: Payer: Self-pay

## 2012-11-07 NOTE — Telephone Encounter (Signed)
Left vm for pt to call back and schedule an appt with Plot °

## 2012-11-07 NOTE — Telephone Encounter (Signed)
Patient called lmovm c/o pink eye and would liek something called in. Per office protocol, no antibiotics with office visit. Pt need to be seen, transferred message to schedulers to set up.

## 2012-11-09 ENCOUNTER — Ambulatory Visit (INDEPENDENT_AMBULATORY_CARE_PROVIDER_SITE_OTHER): Payer: BC Managed Care – PPO | Admitting: Internal Medicine

## 2012-11-09 ENCOUNTER — Encounter: Payer: Self-pay | Admitting: Internal Medicine

## 2012-11-09 VITALS — BP 118/88 | HR 73 | Temp 98.8°F | Wt 178.0 lb

## 2012-11-09 DIAGNOSIS — H5712 Ocular pain, left eye: Secondary | ICD-10-CM

## 2012-11-09 DIAGNOSIS — E538 Deficiency of other specified B group vitamins: Secondary | ICD-10-CM

## 2012-11-09 DIAGNOSIS — H5710 Ocular pain, unspecified eye: Secondary | ICD-10-CM | POA: Insufficient documentation

## 2012-11-09 MED ORDER — ERYTHROMYCIN 5 MG/GM OP OINT: TOPICAL_OINTMENT | Freq: Every day | OPHTHALMIC | Status: DC

## 2012-11-09 MED ORDER — CYANOCOBALAMIN 1000 MCG/ML IJ SOLN
1000.0000 ug | Freq: Once | INTRAMUSCULAR | Status: AC
  Administered 2012-11-09: 1000 ug via INTRAMUSCULAR

## 2012-11-09 MED ORDER — CEFUROXIME AXETIL 500 MG PO TABS: ORAL_TABLET | ORAL | Status: DC

## 2012-11-09 MED ORDER — PSEUDOEPHEDRINE HCL ER 120 MG PO TB12: 120.0000 mg | ORAL_TABLET | Freq: Two times a day (BID) | ORAL | Status: DC | PRN

## 2012-11-09 NOTE — Progress Notes (Signed)
Patient ID: Vanessa [name], female   DOB: 1960/08/18, 52 y.o.   MRN: [id number]   Subjective:    L eye Eye Pain  The left eye is affected.This is a new problem. The current episode started in the past 7 days (Monday). The problem occurs constantly. The pain is moderate. There is no known exposure to pink eye. She does not wear contacts. Associated symptoms include eye redness. Pertinent negatives include no eye discharge or nausea. She has tried eye drops for the symptoms.   Wt Readings from Last 3 Encounters:  11/09/12 178 lb (80.74 kg)  09/11/12 178 lb (80.74 kg)  08/30/12 175 lb (79.379 kg)   BP Readings from Last 3 Encounters:  11/09/12 118/88  09/11/12 130/92  08/30/12 128/88     Review of Systems  Constitutional: Negative.  Negative for chills, diaphoresis, activity change, appetite change, fatigue and unexpected weight change.  HENT: Negative for hearing loss, ear pain, nosebleeds, congestion, sore throat, facial swelling, rhinorrhea, sneezing, mouth sores, trouble swallowing, neck pain, neck stiffness, postnasal drip, sinus pressure and tinnitus.   Eyes: Positive for pain and redness. Negative for discharge, itching and visual disturbance.  Respiratory: Negative for cough, chest tightness, shortness of breath, wheezing and stridor.   Cardiovascular: Negative for palpitations and leg swelling.  Gastrointestinal: Negative for nausea, diarrhea, constipation, blood in stool, abdominal distention, anal bleeding and rectal pain.  Genitourinary: Negative for urgency, frequency, hematuria, flank pain, vaginal bleeding, vaginal discharge, difficulty urinating, genital sores and vaginal pain.  Musculoskeletal: Negative for joint swelling, arthralgias and gait problem.  Skin: Negative.  Negative for rash.  Neurological: Negative for dizziness, tremors, seizures, syncope and speech difficulty.  Hematological: Negative for adenopathy. Does not bruise/bleed easily.  Psychiatric/Behavioral:  Negative for suicidal ideas, behavioral problems, sleep disturbance, dysphoric mood and decreased concentration. The patient is not nervous/anxious.        Objective:   Physical Exam  Constitutional: She appears well-developed. No distress.  obese  HENT:  Head: Normocephalic.  Right Ear: External ear normal.  Left Ear: External ear normal.  Nose: Nose normal.  Mouth/Throat: Oropharynx is clear and moist.  Eyes: Pupils are equal, round, and reactive to light. Right eye exhibits no discharge. Left eye exhibits no discharge.  eryth eyelids on L  Neck: Normal range of motion. Neck supple. No JVD present. No tracheal deviation present. No thyromegaly present.  Cardiovascular: Normal rate, regular rhythm and normal heart sounds.   Pulmonary/Chest: No stridor. No respiratory distress. She has no wheezes.  Abdominal: Soft. Bowel sounds are normal. She exhibits no distension and no mass. There is no tenderness. There is no rebound and no guarding.  Musculoskeletal: She exhibits no edema and no tenderness.  Lymphadenopathy:    She has no cervical adenopathy.  Neurological: She displays normal reflexes. No cranial nerve deficit. She exhibits normal muscle tone. Coordination normal.  Skin: No rash noted. No erythema.  Psychiatric: She has a normal mood and affect. Her behavior is normal. Judgment and thought content normal.   Lab Results  Component Value Date   WBC 11.5* 01/06/2012   HGB 13.8 01/06/2012   HCT 41.6 01/06/2012   PLT 326.0 01/06/2012   GLUCOSE 88 01/06/2012   CHOL 197 01/06/2012   TRIG 80.0 01/06/2012   HDL 38.30* 01/06/2012   LDLDIRECT 246.5 10/16/2009   LDLCALC 143* 01/06/2012   ALT 16 01/06/2012   AST 18 01/06/2012   NA 142 01/06/2012   K 4.2 01/06/2012   CL 108 01/06/2012  CREATININE 0.7 01/06/2012   BUN 19 01/06/2012   CO2 26 01/06/2012   TSH 2.04 01/06/2012          Assessment & Plan:

## 2012-11-09 NOTE — Assessment & Plan Note (Signed)
Ceftin Erythro oint Zyrtec Sudafed

## 2012-11-11 ENCOUNTER — Encounter: Payer: Self-pay | Admitting: Internal Medicine

## 2012-11-12 ENCOUNTER — Other Ambulatory Visit: Payer: Self-pay | Admitting: Internal Medicine

## 2012-11-12 NOTE — Telephone Encounter (Signed)
Pt called requestig a refill on Xanax.  Pt states she failed to request it at appointment on 8.15.14.  Please advise

## 2012-12-05 ENCOUNTER — Encounter: Payer: Self-pay | Admitting: Gynecology

## 2013-01-14 ENCOUNTER — Telehealth: Payer: Self-pay | Admitting: Internal Medicine

## 2013-01-14 MED ORDER — ROSUVASTATIN CALCIUM 20 MG PO TABS: 20.0000 mg | ORAL_TABLET | Freq: Every day | ORAL | Status: DC

## 2013-01-14 NOTE — Telephone Encounter (Signed)
Pt is requesting samples of Crestor to last until her appt on Oct 28.  She will be at her work number until 5 today.

## 2013-01-14 NOTE — Telephone Encounter (Signed)
Samples upfront. Pt informed  

## 2013-01-22 ENCOUNTER — Ambulatory Visit (INDEPENDENT_AMBULATORY_CARE_PROVIDER_SITE_OTHER): Payer: BC Managed Care – PPO

## 2013-01-22 ENCOUNTER — Ambulatory Visit (INDEPENDENT_AMBULATORY_CARE_PROVIDER_SITE_OTHER): Payer: BC Managed Care – PPO | Admitting: Internal Medicine

## 2013-01-22 ENCOUNTER — Encounter: Payer: Self-pay | Admitting: Internal Medicine

## 2013-01-22 VITALS — BP 132/98 | HR 80 | Temp 98.3°F | Resp 16 | Ht 62.0 in | Wt 181.0 lb

## 2013-01-22 DIAGNOSIS — R7309 Other abnormal glucose: Secondary | ICD-10-CM

## 2013-01-22 DIAGNOSIS — E538 Deficiency of other specified B group vitamins: Secondary | ICD-10-CM

## 2013-01-22 DIAGNOSIS — Z23 Encounter for immunization: Secondary | ICD-10-CM

## 2013-01-22 DIAGNOSIS — R635 Abnormal weight gain: Secondary | ICD-10-CM

## 2013-01-22 DIAGNOSIS — Z0000 Encounter for general adult medical examination without abnormal findings: Secondary | ICD-10-CM

## 2013-01-22 DIAGNOSIS — N951 Menopausal and female climacteric states: Secondary | ICD-10-CM

## 2013-01-22 DIAGNOSIS — F411 Generalized anxiety disorder: Secondary | ICD-10-CM

## 2013-01-22 DIAGNOSIS — E039 Hypothyroidism, unspecified: Secondary | ICD-10-CM

## 2013-01-22 LAB — HEPATIC FUNCTION PANEL
ALT: 20 U/L (ref 0–35)
AST: 22 U/L (ref 0–37)
Albumin: 4 g/dL (ref 3.5–5.2)
Bilirubin, Direct: 0 mg/dL (ref 0.0–0.3)
Total Bilirubin: 0.5 mg/dL (ref 0.3–1.2)

## 2013-01-22 LAB — CBC WITH DIFFERENTIAL/PLATELET
Basophils Absolute: 0.1 10*3/uL (ref 0.0–0.1)
Eosinophils Absolute: 0.6 10*3/uL (ref 0.0–0.7)
Eosinophils Relative: 4.5 % (ref 0.0–5.0)
HCT: 40.8 % (ref 36.0–46.0)
Lymphs Abs: 3.8 10*3/uL (ref 0.7–4.0)
Monocytes Relative: 5.7 % (ref 3.0–12.0)
Neutro Abs: 8.6 10*3/uL — ABNORMAL HIGH (ref 1.4–7.7)
Neutrophils Relative %: 61.7 % (ref 43.0–77.0)
Platelets: 334 10*3/uL (ref 150.0–400.0)
RBC: 4.61 Mil/uL (ref 3.87–5.11)
WBC: 13.9 10*3/uL — ABNORMAL HIGH (ref 4.5–10.5)

## 2013-01-22 LAB — URINALYSIS
Bilirubin Urine: NEGATIVE
Urine Glucose: NEGATIVE
Urobilinogen, UA: 0.2 (ref 0.0–1.0)

## 2013-01-22 LAB — LIPID PANEL
Cholesterol: 224 mg/dL — ABNORMAL HIGH (ref 0–200)
HDL: 37 mg/dL — ABNORMAL LOW (ref >39.00)
Total CHOL/HDL Ratio: 6
Triglycerides: 237 mg/dL — ABNORMAL HIGH (ref 0.0–149.0)
VLDL: 47.4 mg/dL — ABNORMAL HIGH (ref 0.0–40.0)

## 2013-01-22 LAB — BASIC METABOLIC PANEL
BUN: 14 mg/dL (ref 6–23)
Calcium: 9.2 mg/dL (ref 8.4–10.5)
Chloride: 105 mEq/L (ref 96–112)
Creatinine, Ser: 0.8 mg/dL (ref 0.4–1.2)
GFR: 82.2 mL/min (ref >60.00)
Potassium: 4.1 mEq/L (ref 3.5–5.1)
Sodium: 138 mEq/L (ref 135–145)

## 2013-01-22 LAB — HEMOGLOBIN A1C: Hgb A1c MFr Bld: 5.7 % (ref 4.6–6.5)

## 2013-01-22 MED ORDER — ROSUVASTATIN CALCIUM 20 MG PO TABS: 20.0000 mg | ORAL_TABLET | Freq: Every day | ORAL | Status: DC

## 2013-01-22 MED ORDER — LEVOTHYROXINE SODIUM 75 MCG PO TABS: 75.0000 ug | ORAL_TABLET | Freq: Every day | ORAL | Status: DC

## 2013-01-22 MED ORDER — CYANOCOBALAMIN 1000 MCG/ML IJ SOLN
1000.0000 ug | Freq: Once | INTRAMUSCULAR | Status: AC
  Administered 2013-01-22: 1000 ug via INTRAMUSCULAR

## 2013-01-22 MED ORDER — ALPRAZOLAM 0.25 MG PO TABS: ORAL_TABLET | ORAL | Status: DC

## 2013-01-22 NOTE — Assessment & Plan Note (Signed)
A1c Loose wt 

## 2013-01-22 NOTE — Assessment & Plan Note (Signed)
Continue with current prescription therapy as reflected on the Med list.  

## 2013-01-22 NOTE — Patient Instructions (Signed)
Contour pillow  

## 2013-01-22 NOTE — Progress Notes (Signed)
   Subjective:     HPI  The patient is here for a wellness exam. The patient has been doing well overall without major physical or psychological issues going on lately.  C/o wt gain C/ neck stiffness  Wt Readings from Last 3 Encounters:  01/22/13 181 lb (82.101 kg)  11/09/12 178 lb (80.74 kg)  09/11/12 178 lb (80.74 kg)   BP Readings from Last 3 Encounters:  01/22/13 132/98  11/09/12 118/88  09/11/12 130/92     Review of Systems  Constitutional: Negative.  Negative for chills, diaphoresis, activity change, appetite change, fatigue and unexpected weight change.  HENT: Negative for congestion, ear pain, facial swelling, hearing loss, mouth sores, nosebleeds, postnasal drip, rhinorrhea, sinus pressure, sneezing, sore throat, tinnitus and trouble swallowing.   Eyes: Negative for pain, discharge, redness, itching and visual disturbance.  Respiratory: Negative for cough, chest tightness, shortness of breath, wheezing and stridor.   Cardiovascular: Negative for palpitations and leg swelling.  Gastrointestinal: Negative for nausea, diarrhea, constipation, blood in stool, abdominal distention, anal bleeding and rectal pain.  Genitourinary: Negative for urgency, frequency, hematuria, flank pain, vaginal bleeding, vaginal discharge, difficulty urinating, genital sores and vaginal pain.  Musculoskeletal: Negative for arthralgias, gait problem, joint swelling, neck pain and neck stiffness.  Skin: Negative.  Negative for rash.  Neurological: Negative for dizziness, tremors, seizures, syncope and speech difficulty.  Hematological: Negative for adenopathy. Does not bruise/bleed easily.  Psychiatric/Behavioral: Negative for suicidal ideas, behavioral problems, sleep disturbance, dysphoric mood and decreased concentration. The patient is not nervous/anxious.        Objective:   Physical Exam  Constitutional: She appears well-developed. No distress.  obese  HENT:  Head: Normocephalic.   Right Ear: External ear normal.  Left Ear: External ear normal.  Nose: Nose normal.  Mouth/Throat: Oropharynx is clear and moist.  Eyes: Conjunctivae are normal. Pupils are equal, round, and reactive to light. Right eye exhibits no discharge. Left eye exhibits no discharge.  Neck: Normal range of motion. Neck supple. No JVD present. No tracheal deviation present. No thyromegaly present.  Cardiovascular: Normal rate, regular rhythm and normal heart sounds.   Pulmonary/Chest: No stridor. No respiratory distress. She has no wheezes.  Abdominal: Soft. Bowel sounds are normal. She exhibits no distension and no mass. There is no tenderness. There is no rebound and no guarding.  Musculoskeletal: She exhibits tenderness (Tender under R scapula). She exhibits no edema.  Lymphadenopathy:    She has no cervical adenopathy.  Neurological: She displays normal reflexes. No cranial nerve deficit. She exhibits normal muscle tone. Coordination normal.  Skin: No rash noted. No erythema.  Psychiatric: She has a normal mood and affect. Her behavior is normal. Judgment and thought content normal.   Lab Results  Component Value Date   WBC 11.5* 01/06/2012   HGB 13.8 01/06/2012   HCT 41.6 01/06/2012   PLT 326.0 01/06/2012   GLUCOSE 88 01/06/2012   CHOL 197 01/06/2012   TRIG 80.0 01/06/2012   HDL 38.30* 01/06/2012   LDLDIRECT 246.5 10/16/2009   LDLCALC 143* 01/06/2012   ALT 16 01/06/2012   AST 18 01/06/2012   NA 142 01/06/2012   K 4.2 01/06/2012   CL 108 01/06/2012   CREATININE 0.7 01/06/2012   BUN 19 01/06/2012   CO2 26 01/06/2012   TSH 2.04 01/06/2012          Assessment & Plan:

## 2013-01-22 NOTE — Assessment & Plan Note (Signed)
We discussed age appropriate health related issues, including available/recomended screening tests and vaccinations. We discussed a need for adhering to healthy diet and exercise. Labs/EKG were reviewed/ordered. All questions were answered. Loose wt  

## 2013-01-22 NOTE — Assessment & Plan Note (Signed)
Better  

## 2013-01-22 NOTE — Assessment & Plan Note (Signed)
Wt watchers 

## 2013-01-23 LAB — LDL CHOLESTEROL, DIRECT: Direct LDL: 153.7 mg/dL

## 2013-01-30 ENCOUNTER — Telehealth: Payer: Self-pay | Admitting: Internal Medicine

## 2013-01-30 ENCOUNTER — Telehealth: Payer: Self-pay | Admitting: *Deleted

## 2013-01-30 MED ORDER — AMOXICILLIN 500 MG PO CAPS: 1000.0000 mg | ORAL_CAPSULE | Freq: Two times a day (BID) | ORAL | Status: DC

## 2013-01-30 NOTE — Telephone Encounter (Signed)
LMOM for pt to call her pharmacy

## 2013-01-30 NOTE — Telephone Encounter (Signed)
Pt was offered an antibiotic at her appt last week.  She now wants one called in.

## 2013-01-30 NOTE — Telephone Encounter (Signed)
Amoxicillin was called in Thx

## 2013-01-30 NOTE — Telephone Encounter (Signed)
done

## 2013-01-30 NOTE — Telephone Encounter (Signed)
Pt called states MD offered an antibiotic Rx at last OV for sinuses and earache.  Pt requests Rx at this time.  Please advise

## 2013-01-31 NOTE — Telephone Encounter (Signed)
Spoke with pt advised Rx sent 

## 2013-02-07 ENCOUNTER — Other Ambulatory Visit: Payer: Self-pay | Admitting: Internal Medicine

## 2013-03-12 ENCOUNTER — Other Ambulatory Visit: Payer: Self-pay | Admitting: Internal Medicine

## 2013-04-12 ENCOUNTER — Ambulatory Visit (INDEPENDENT_AMBULATORY_CARE_PROVIDER_SITE_OTHER): Payer: BC Managed Care – PPO | Admitting: Family Medicine

## 2013-04-12 ENCOUNTER — Encounter: Payer: Self-pay | Admitting: Family Medicine

## 2013-04-12 VITALS — BP 142/90 | HR 74 | Temp 98.6°F | Resp 16 | Wt 179.0 lb

## 2013-04-12 DIAGNOSIS — E538 Deficiency of other specified B group vitamins: Secondary | ICD-10-CM

## 2013-04-12 DIAGNOSIS — J329 Chronic sinusitis, unspecified: Secondary | ICD-10-CM

## 2013-04-12 MED ORDER — AMOXICILLIN-POT CLAVULANATE 875-125 MG PO TABS: 1.0000 | ORAL_TABLET | Freq: Two times a day (BID) | ORAL | Status: DC

## 2013-04-12 MED ORDER — CYANOCOBALAMIN 1000 MCG/ML IJ SOLN
1000.0000 ug | Freq: Once | INTRAMUSCULAR | Status: AC
  Administered 2013-04-12: 1000 ug via INTRAMUSCULAR

## 2013-04-12 NOTE — Progress Notes (Signed)
SUBJECTIVE:  Vanessa [name] is a 53 y.o. female who complains of congestion, sneezing, swollen glands, post nasal drip, left sinus pain and pain while swallowing for 5 days. She denies a history of chest pain, dizziness, shortness of breath, weakness and weight loss and denies a history of asthma. Patient admits to smoke cigarettes. + sick contacts.   Past Medical History  Diagnosis Date  . Hyperlipidemia   . Cyst     gangeous- left hand  . Tobacco abuse   . Allergy     rhinitis  . Hypothyroidism   . Anxiety   . Depression   . B12 deficiency    Past Surgical History  Procedure Laterality Date  . Cesarean section  1992  . Ganglion cyst excision  2009    right ankle   History  Substance Use Topics  . Smoking status: Current Every Day Smoker -- 0.25 packs/day    Types: Cigarettes  . Smokeless tobacco: Never Used  . Alcohol Use: No   Family History  Problem Relation Age of Onset  . Thyroid disease Mother     Low  . Other Mother     B12 deficiency  . Coronary artery disease Other   . Hyperlipidemia Other   . Heart disease Father 61    CAD  . Colon cancer Neg Hx    Review of systems as stated above in the history of present illness otherwise 14 system review was unremarkable.   OBJECTIVE: Blood pressure 142/90, pulse 74, temperature 98.6 F (37 C), temperature source Oral, resp. rate 16, weight 179 lb (81.194 kg), last menstrual period 03/08/2006, SpO2 98.00%.  She appears well, vital signs are as noted. Ears mild fluid behind the tympanic membranes bilaterally.  Throat and pharynx  erythema of the posterior pharynx with postnasal drip .  Neck supple. No adenopathy in the neck. Nose is congested. Sinuses tender maxillary left . The chest is clear, without wheezes or rales.  ASSESSMENT:  viral upper respiratory illness and sinusitis  PLAN: Symptomatic therapy suggested: push fluids, rest, gargle warm salt water, apply heat to sinuses prn and return office visit prn if  symptoms persist or worsen. Medications per orders. Call or return to clinic prn if these symptoms worsen or fail to improve as anticipated.

## 2013-04-12 NOTE — Patient Instructions (Signed)
Nice to meet you B12 today Augmentin twice daily for 10 days Sinusitis Sinusitis is redness, soreness, and swelling (inflammation) of the paranasal sinuses. Paranasal sinuses are air pockets within the bones of your face (beneath the eyes, the middle of the forehead, or above the eyes). In healthy paranasal sinuses, mucus is able to drain out, and air is able to circulate through them by way of your nose. However, when your paranasal sinuses are inflamed, mucus and air can become trapped. This can allow bacteria and other germs to grow and cause infection. Sinusitis can develop quickly and last only a short time (acute) or continue over a long period (chronic). Sinusitis that lasts for more than 12 weeks is considered chronic.  CAUSES  Causes of sinusitis include:  Allergies.  Structural abnormalities, such as displacement of the cartilage that separates your nostrils (deviated septum), which can decrease the air flow through your nose and sinuses and affect sinus drainage.  Functional abnormalities, such as when the small hairs (cilia) that line your sinuses and help remove mucus do not work properly or are not present. SYMPTOMS  Symptoms of acute and chronic sinusitis are the same. The primary symptoms are pain and pressure around the affected sinuses. Other symptoms include:  Upper toothache.  Earache.  Headache.  Bad breath.  Decreased sense of smell and taste.  A cough, which worsens when you are lying flat.  Fatigue.  Fever.  Thick drainage from your nose, which often is green and may contain pus (purulent).  Swelling and warmth over the affected sinuses. DIAGNOSIS  Your caregiver will perform a physical exam. During the exam, your caregiver may:  Look in your nose for signs of abnormal growths in your nostrils (nasal polyps).  Tap over the affected sinus to check for signs of infection.  View the inside of your sinuses (endoscopy) with a special imaging device with a  light attached (endoscope), which is inserted into your sinuses. If your caregiver suspects that you have chronic sinusitis, one or more of the following tests may be recommended:  Allergy tests.  Nasal culture A sample of mucus is taken from your nose and sent to a lab and screened for bacteria.  Nasal cytology A sample of mucus is taken from your nose and examined by your caregiver to determine if your sinusitis is related to an allergy. TREATMENT  Most cases of acute sinusitis are related to a viral infection and will resolve on their own within 10 days. Sometimes medicines are prescribed to help relieve symptoms (pain medicine, decongestants, nasal steroid sprays, or saline sprays).  However, for sinusitis related to a bacterial infection, your caregiver will prescribe antibiotic medicines. These are medicines that will help kill the bacteria causing the infection.  Rarely, sinusitis is caused by a fungal infection. In theses cases, your caregiver will prescribe antifungal medicine. For some cases of chronic sinusitis, surgery is needed. Generally, these are cases in which sinusitis recurs more than 3 times per year, despite other treatments. HOME CARE INSTRUCTIONS   Drink plenty of water. Water helps thin the mucus so your sinuses can drain more easily.  Use a humidifier.  Inhale steam 3 to 4 times a day (for example, sit in the bathroom with the shower running).  Apply a warm, moist washcloth to your face 3 to 4 times a day, or as directed by your caregiver.  Use saline nasal sprays to help moisten and clean your sinuses.  Take over-the-counter or prescription medicines for pain,  discomfort, or fever only as directed by your caregiver. SEEK IMMEDIATE MEDICAL CARE IF:  You have increasing pain or severe headaches.  You have nausea, vomiting, or drowsiness.  You have swelling around your face.  You have vision problems.  You have a stiff neck.  You have difficulty  breathing. MAKE SURE YOU:   Understand these instructions.  Will watch your condition.  Will get help right away if you are not doing well or get worse. Document Released: 03/14/2005 Document Revised: 06/06/2011 Document Reviewed: 03/29/2011 [facility] Patient Information 2014 [place], [place].

## 2013-04-15 ENCOUNTER — Ambulatory Visit: Payer: BC Managed Care – PPO

## 2013-04-24 ENCOUNTER — Telehealth: Payer: Self-pay | Admitting: *Deleted

## 2013-04-24 NOTE — Telephone Encounter (Signed)
Patient left vm requesting a call back concerning antibiotics. Patient is requesting another antibiotic besides augmentin. Pt stated that antibiotic did not work. Returned call & LM for to call back. Please advise?

## 2013-04-24 NOTE — Telephone Encounter (Signed)
Would nee to be seen again.  Antibiotic would cure most ailments.

## 2013-04-25 ENCOUNTER — Telehealth: Payer: Self-pay

## 2013-04-25 MED ORDER — DOXYCYCLINE HYCLATE 100 MG PO TABS: 100.0000 mg | ORAL_TABLET | Freq: Two times a day (BID) | ORAL | Status: AC

## 2013-04-25 NOTE — Telephone Encounter (Signed)
Left message on VM advised of MDs message

## 2013-04-25 NOTE — Telephone Encounter (Signed)
Sent in doxy. Please call her and tell her.   Will not do this again though without patient coming in.  Thank you

## 2013-04-25 NOTE — Telephone Encounter (Signed)
I called and spoke to patient since she left a message on triage at 8:51 today. She states she did not finish the Augmentin because she started itching from it. I advised her per your message she would need to be seen. She is hoping something else can just be called in since she did not finish the first antibiotic. Please advise.

## 2013-04-25 NOTE — Telephone Encounter (Signed)
Patient has been advised

## 2013-05-25 ENCOUNTER — Other Ambulatory Visit: Payer: Self-pay | Admitting: Internal Medicine

## 2013-05-27 ENCOUNTER — Telehealth: Payer: Self-pay | Admitting: *Deleted

## 2013-05-27 NOTE — Telephone Encounter (Signed)
Patient phoned requesting refill for her xanax.  Last OV with PCP 04/12/13 and med last ordered 01/22/13.  Please advise.  CB# [phone]

## 2013-05-27 NOTE — Telephone Encounter (Signed)
OK to fill this prescription with additional refills x2 Thank you!  

## 2013-05-27 NOTE — Telephone Encounter (Signed)
Script phoned in per MD and patient notified via her voicemail message.

## 2013-05-29 ENCOUNTER — Ambulatory Visit: Payer: BC Managed Care – PPO

## 2013-05-31 ENCOUNTER — Ambulatory Visit: Payer: BC Managed Care – PPO

## 2013-06-26 ENCOUNTER — Ambulatory Visit (INDEPENDENT_AMBULATORY_CARE_PROVIDER_SITE_OTHER): Payer: BC Managed Care – PPO | Admitting: *Deleted

## 2013-06-26 ENCOUNTER — Ambulatory Visit (INDEPENDENT_AMBULATORY_CARE_PROVIDER_SITE_OTHER): Payer: BC Managed Care – PPO | Admitting: Gynecology

## 2013-06-26 ENCOUNTER — Encounter: Payer: Self-pay | Admitting: Gynecology

## 2013-06-26 ENCOUNTER — Other Ambulatory Visit (HOSPITAL_COMMUNITY)
Admission: RE | Admit: 2013-06-26 | Discharge: 2013-06-26 | Disposition: A | Discharge status: Home / Self Care | Payer: BC Managed Care – PPO | Source: Ambulatory Visit | Attending: Gynecology | Admitting: Gynecology

## 2013-06-26 VITALS — BP 120/84 | Ht 62.5 in | Wt 175.0 lb

## 2013-06-26 DIAGNOSIS — N951 Menopausal and female climacteric states: Secondary | ICD-10-CM

## 2013-06-26 DIAGNOSIS — G4700 Insomnia, unspecified: Secondary | ICD-10-CM

## 2013-06-26 DIAGNOSIS — E538 Deficiency of other specified B group vitamins: Secondary | ICD-10-CM

## 2013-06-26 DIAGNOSIS — Z780 Asymptomatic menopausal state: Secondary | ICD-10-CM

## 2013-06-26 DIAGNOSIS — Z01419 Encounter for gynecological examination (general) (routine) without abnormal findings: Secondary | ICD-10-CM

## 2013-06-26 DIAGNOSIS — Z1151 Encounter for screening for human papillomavirus (HPV): Secondary | ICD-10-CM | POA: Insufficient documentation

## 2013-06-26 DIAGNOSIS — F17200 Nicotine dependence, unspecified, uncomplicated: Secondary | ICD-10-CM | POA: Insufficient documentation

## 2013-06-26 MED ORDER — CYANOCOBALAMIN 1000 MCG/ML IJ SOLN
1000.0000 ug | Freq: Once | INTRAMUSCULAR | Status: AC
  Administered 2013-06-26: 1000 ug via INTRAMUSCULAR

## 2013-06-26 NOTE — Progress Notes (Signed)
[place] September 27, 1960 [id number]   History:    53 y.o.  for annual gyn exam with no complaints today who has not been seen in the office was 2010. Patient with no prior history of abnormal Pap smears. Patient reached menopause in 2009 at which time a Mirena IUD was removed based on an elevated [place]. Patient denies any vaginal bleeding. Patient has not been on any hormone replacement therapy. Patient with motorsymptoms.Shestatedthatherearlythisyearshehadacolonoscopyinseveralbenignpolypswereremoved.She still continues to smoke a pack of cigarettes per day. Patient has not had a bone density study as of yet. Patient's immunizations are up to date. Her PCP has been doing her blood work Dr. Alain Marion.   Past medical history,surgical history, family history and social history were all reviewed and documented in the EPIC chart.  Gynecologic History Patient's last menstrual period was 03/08/2006. Contraception: post menopausal status Last Pap: 2010. Results were: normal Last mammogram: 2014. Results were: Normal but dense will recommend 3-D this year  Obstetric History OB History  Gravida Para Term Preterm AB SAB TAB Ectopic Multiple Living  2 2        2     # Outcome Date GA Lbr Len/2nd Weight Sex Delivery Anes PTL Lv  2 PAR           1 PAR                ROS: A ROS was performed and pertinent positives and negatives are included in the history.  GENERAL: No fevers or chills. HEENT: No change in vision, no earache, sore throat or sinus congestion. NECK: No pain or stiffness. CARDIOVASCULAR: No chest pain or pressure. No palpitations. PULMONARY: No shortness of breath, cough or wheeze. GASTROINTESTINAL: No abdominal pain, nausea, vomiting or diarrhea, melena or bright red blood per rectum. GENITOURINARY: No urinary frequency, urgency, hesitancy or dysuria. MUSCULOSKELETAL: No joint or muscle pain, no back pain, no recent trauma. DERMATOLOGIC: No rash, no itching, no lesions. ENDOCRINE: No  polyuria, polydipsia, no heat or cold intolerance. No recent change in weight. HEMATOLOGICAL: No anemia or easy bruising or bleeding. NEUROLOGIC: No headache, seizures, numbness, tingling or weakness. PSYCHIATRIC: No depression, no loss of interest in normal activity or change in sleep pattern.     Exam: chaperone present  BP 120/84  Ht 5' 2.5" (1.588 m)  Wt 175 lb (79.379 kg)  BMI 31.48 kg/m2  LMP 03/08/2006  Body mass index is 31.48 kg/(m^2).  General appearance : Well developed well nourished female. No acute distress HEENT: Neck supple, trachea midline, no carotid bruits, no thyroidmegaly Lungs: Clear to auscultation, no rhonchi or wheezes, or rib retractions  Heart: Regular rate and rhythm, no murmurs or gallops Breast:Examined in sitting and supine position were symmetrical in appearance, no palpable masses or tenderness,  no skin retraction, no nipple inversion, no nipple discharge, no skin discoloration, no axillary or supraclavicular lymphadenopathy Abdomen: no palpable masses or tenderness, no rebound or guarding Extremities: no edema or skin discoloration or tenderness  Pelvic:  [place], Urethra, Skene Glands: Within normal limits             Vagina: No gross lesions or discharge  Cervix: No gross lesions or discharge  Uterus  anteverted, normal size, shape and consistency, non-tender and mobile  Adnexa  Without masses or tenderness  Anus and perineum  normal   Rectovaginal  normal sphincter tone without palpated masses or tenderness             Hemoccult had colonoscopy this year  Assessment/Plan:  54 y.o. female for annual exam menopausal with no weighs a motor symptoms. Ligature information on [place] was provided in the event of dyspareunia or vaginal dryness in the near future. Pap smear with HPV screen was done today. Patient was counseled again on the detrimental effects of smoking. She was monitored to do monthly breast exam. We discussed importance of  calcium and vitamin D in regular exercise for osteoporosis prevention. Patient was scheduled for a bone density study here in the office in the next 2 weeks. Patient states that she takes Xanax when necessary insomnia.  Note: This dictation was prepared with  Dragon/digital dictation along withSmart phrase technology. Any transcriptional errors that result from this process are unintentional.   Terrance Mass MD, 3:27 PM 06/26/2013

## 2013-06-26 NOTE — Patient Instructions (Addendum)
Smoking Cessation Quitting smoking is important to your health and has many advantages. However, it is not always easy to quit since nicotine is a very addictive drug. Often times, people try 3 times or more before being able to quit. This document explains the best ways for you to prepare to quit smoking. Quitting takes hard work and a lot of effort, but you can do it. ADVANTAGES OF QUITTING SMOKING  You will live longer, feel better, and live better.  Your body will feel the impact of quitting smoking almost immediately.  Within 20 minutes, blood pressure decreases. Your pulse returns to its normal level.  After 8 hours, carbon monoxide levels in the blood return to normal. Your oxygen level increases.  After 24 hours, the chance of having a heart attack starts to decrease. Your breath, hair, and body stop smelling like smoke.  After 48 hours, damaged nerve endings begin to recover. Your sense of taste and smell improve.  After 72 hours, the body is virtually free of nicotine. Your bronchial tubes relax and breathing becomes easier.  After 2 to 12 weeks, lungs can hold more air. Exercise becomes easier and circulation improves.  The risk of having a heart attack, stroke, cancer, or lung disease is greatly reduced.  After 1 year, the risk of coronary heart disease is cut in half.  After 5 years, the risk of stroke falls to the same as a nonsmoker.  After 10 years, the risk of lung cancer is cut in half and the risk of other cancers decreases significantly.  After 15 years, the risk of coronary heart disease drops, usually to the level of a nonsmoker.  If you are pregnant, quitting smoking will improve your chances of having a healthy baby.  The people you live with, especially any children, will be healthier.  You will have extra money to spend on things other than cigarettes. QUESTIONS TO THINK ABOUT BEFORE ATTEMPTING TO QUIT You may want to talk about your answers with your  caregiver.  Why do you want to quit?  If you tried to quit in the past, what helped and what did not?  What will be the most difficult situations for you after you quit? How will you plan to handle them?  Who can help you through the tough times? Your family? Friends? A caregiver?  What pleasures do you get from smoking? What ways can you still get pleasure if you quit? Here are some questions to ask your caregiver:  How can you help me to be successful at quitting?  What medicine do you think would be best for me and how should I take it?  What should I do if I need more help?  What is smoking withdrawal like? How can I get information on withdrawal? GET READY  Set a quit date.  Change your environment by getting rid of all cigarettes, ashtrays, matches, and lighters in your home, car, or work. Do not let people smoke in your home.  Review your past attempts to quit. Think about what worked and what did not. GET SUPPORT AND ENCOURAGEMENT You have a better chance of being successful if you have help. You can get support in many ways.  Tell your family, friends, and co-workers that you are going to quit and need their support. Ask them not to smoke around you.  Get individual, group, or telephone counseling and support. Programs are available at local hospitals and health centers. Call your local health department for   information about programs in your area.  Spiritual beliefs and practices may help some smokers quit.  Download a "quit meter" on your computer to keep track of quit statistics, such as how long you have gone without smoking, cigarettes not smoked, and money saved.  Get a self-help book about quitting smoking and staying off of tobacco. [place] yourself from urges to smoke. Talk to someone, go for a walk, or occupy your time with a task.  Change your normal routine. Take a different route to work. Drink tea instead of coffee.  Eat breakfast in a different place.  Reduce your stress. Take a hot bath, exercise, or read a book.  Plan something enjoyable to do every day. Reward yourself for not smoking.  Explore interactive web-based programs that specialize in helping you quit. GET MEDICINE AND USE IT CORRECTLY Medicines can help you stop smoking and decrease the urge to smoke. Combining medicine with the above behavioral methods and support can greatly increase your chances of successfully quitting smoking.  Nicotine replacement therapy helps deliver nicotine to your body without the negative effects and risks of smoking. Nicotine replacement therapy includes nicotine gum, lozenges, inhalers, nasal sprays, and skin patches. Some may be available over-the-counter and others require a prescription.  Antidepressant medicine helps people abstain from smoking, but how this works is unknown. This medicine is available by prescription.  Nicotinic receptor partial agonist medicine simulates the effect of nicotine in your brain. This medicine is available by prescription. Ask your caregiver for advice about which medicines to use and how to use them based on your health history. Your caregiver will tell you what side effects to look out for if you choose to be on a medicine or therapy. Carefully read the information on the package. Do not use any other product containing nicotine while using a nicotine replacement product.  RELAPSE OR DIFFICULT SITUATIONS Most relapses occur within the first 3 months after quitting. Do not be discouraged if you start smoking again. Remember, most people try several times before finally quitting. You may have symptoms of withdrawal because your body is used to nicotine. You may crave cigarettes, be irritable, feel very hungry, cough often, get headaches, or have difficulty concentrating. The withdrawal symptoms are only temporary. They are strongest when you first quit, but they will go away within  10 14 days. To reduce the chances of relapse, try to:  Avoid drinking alcohol. Drinking lowers your chances of successfully quitting.  Reduce the amount of caffeine you consume. Once you quit smoking, the amount of caffeine in your body increases and can give you symptoms, such as a rapid heartbeat, sweating, and anxiety.  Avoid smokers because they can make you want to smoke.  Do not let weight gain distract you. Many smokers will gain weight when they quit, usually less than 10 pounds. Eat a healthy diet and stay active. You can always lose the weight gained after you quit.  Find ways to improve your mood other than smoking. FOR MORE INFORMATION  www.smokefree.gov  Document Released: 03/08/2001 Document Revised: 09/13/2011 Document Reviewed: 06/23/2011 [facility] Patient Information 2014 [place], [place]. Bone Densitometry Bone densitometry is a special X-ray that measures your bone density and can be used to help predict your risk of bone fractures. This test is used to determine bone mineral content and density to diagnose osteoporosis. Osteoporosis is the loss of bone that may cause the bone to become weak. Osteoporosis commonly occurs in women  entering menopause. However, it may be found in men and in people with other diseases. PREPARATION FOR TEST No preparation necessary. WHO SHOULD BE TESTED?  All women older than 20.  Postmenopausal women (50 to 73) with risk factors for osteoporosis.  People with a previous fracture caused by normal activities.  People with a small body frame (less than 127 poundsor a body mass index [BMI] of less than 21).  People who have a parent with a hip fracture or history of osteoporosis.  People who smoke.  People who have rheumatoid arthritis.  Anyone who engages in excessive alcohol use (more than 3 drinks most days).  Women who experience early menopause. WHEN SHOULD YOU BE RETESTED? Current guidelines suggest that you should wait at  least 2 years before doing a bone density test again if your first test was normal.Recent studies indicated that women with normal bone density may be able to wait a few years before needing to repeat a bone density test. You should discuss this with your caregiver.  NORMAL FINDINGS   Normal: less than standard deviation below normal (greater than -1).  Osteopenia: 1 to 2.5 standard deviations below normal (-1 to -2.5).  Osteoporosis: greater than 2.5 standard deviations below normal (less than -2.5). Test results are reported as a "T score" and a "Z score."The T score is a number that compares your bone density with the bone density of healthy, young women.The Z score is a number that compares your bone density with the scores of women who are the same age, gender, and race.  Ranges for normal findings may vary among different laboratories and hospitals. You should always check with your doctor after having lab work or other tests done to discuss the meaning of your test results and whether your values are considered within normal limits. MEANING OF TEST  Your caregiver will go over the test results with you and discuss the importance and meaning of your results, as well as treatment options and the need for additional tests if necessary. OBTAINING THE TEST RESULTS It is your responsibility to obtain your test results. Ask the lab or department performing the test when and how you will get your results. Document Released: 04/05/2004 Document Revised: 06/06/2011 Document Reviewed: 04/28/2010 [facility] Patient Information 2014 [place].

## 2013-07-02 ENCOUNTER — Telehealth: Payer: Self-pay | Admitting: *Deleted

## 2013-07-02 NOTE — Telephone Encounter (Signed)
Pt informed with pap results on 06/26/13 visit.

## 2013-08-02 ENCOUNTER — Ambulatory Visit (INDEPENDENT_AMBULATORY_CARE_PROVIDER_SITE_OTHER): Payer: BC Managed Care – PPO

## 2013-08-02 DIAGNOSIS — E538 Deficiency of other specified B group vitamins: Secondary | ICD-10-CM

## 2013-08-02 MED ORDER — CYANOCOBALAMIN 1000 MCG/ML IJ SOLN
1000.0000 ug | Freq: Once | INTRAMUSCULAR | Status: AC
  Administered 2013-08-02: 1000 ug via INTRAMUSCULAR

## 2013-08-22 ENCOUNTER — Other Ambulatory Visit: Payer: Self-pay | Admitting: Internal Medicine

## 2013-09-05 ENCOUNTER — Ambulatory Visit (INDEPENDENT_AMBULATORY_CARE_PROVIDER_SITE_OTHER): Payer: BC Managed Care – PPO

## 2013-09-05 DIAGNOSIS — E538 Deficiency of other specified B group vitamins: Secondary | ICD-10-CM

## 2013-09-05 MED ORDER — CYANOCOBALAMIN 1000 MCG/ML IJ SOLN
1000.0000 ug | Freq: Once | INTRAMUSCULAR | Status: AC
  Administered 2013-09-05: 1000 ug via INTRAMUSCULAR

## 2013-10-14 ENCOUNTER — Other Ambulatory Visit: Payer: BC Managed Care – PPO

## 2013-10-14 ENCOUNTER — Ambulatory Visit (INDEPENDENT_AMBULATORY_CARE_PROVIDER_SITE_OTHER): Payer: BC Managed Care – PPO | Admitting: Internal Medicine

## 2013-10-14 ENCOUNTER — Other Ambulatory Visit (INDEPENDENT_AMBULATORY_CARE_PROVIDER_SITE_OTHER): Payer: BC Managed Care – PPO

## 2013-10-14 ENCOUNTER — Encounter: Payer: Self-pay | Admitting: Internal Medicine

## 2013-10-14 VITALS — BP 120/90 | HR 86 | Temp 98.2°F | Wt 172.6 lb

## 2013-10-14 DIAGNOSIS — R82998 Other abnormal findings in urine: Secondary | ICD-10-CM

## 2013-10-14 DIAGNOSIS — E538 Deficiency of other specified B group vitamins: Secondary | ICD-10-CM

## 2013-10-14 DIAGNOSIS — R109 Unspecified abdominal pain: Secondary | ICD-10-CM

## 2013-10-14 DIAGNOSIS — R8290 Unspecified abnormal findings in urine: Secondary | ICD-10-CM

## 2013-10-14 LAB — POCT URINALYSIS DIPSTICK
BILIRUBIN UA: NEGATIVE
GLUCOSE UA: NEGATIVE
KETONES UA: NEGATIVE
Leukocytes, UA: NEGATIVE
Nitrite, UA: NEGATIVE
SPEC GRAV UA: 1.015
Urobilinogen, UA: 0.2
pH, UA: 6

## 2013-10-14 LAB — CBC WITH DIFFERENTIAL/PLATELET
BASOS PCT: 0.7 % (ref 0.0–3.0)
Basophils Absolute: 0.1 10*3/uL (ref 0.0–0.1)
EOS PCT: 3.7 % (ref 0.0–5.0)
Eosinophils Absolute: 0.6 10*3/uL (ref 0.0–0.7)
HEMATOCRIT: 42.2 % (ref 36.0–46.0)
HEMOGLOBIN: 14.5 g/dL (ref 12.0–15.0)
LYMPHS ABS: 4.1 10*3/uL — AB (ref 0.7–4.0)
LYMPHS PCT: 25.9 % (ref 12.0–46.0)
MCHC: 34.3 g/dL (ref 30.0–36.0)
MCV: 88 fl (ref 78.0–100.0)
MONOS PCT: 4.6 % (ref 3.0–12.0)
Monocytes Absolute: 0.7 10*3/uL (ref 0.1–1.0)
NEUTROS ABS: 10.3 10*3/uL — AB (ref 1.4–7.7)
Neutrophils Relative %: 65.1 % (ref 43.0–77.0)
Platelets: 367 10*3/uL (ref 150.0–400.0)
RBC: 4.79 Mil/uL (ref 3.87–5.11)
RDW: 13.6 % (ref 11.5–15.5)
WBC: 15.8 10*3/uL — AB (ref 4.0–10.5)

## 2013-10-14 MED ORDER — TRAMADOL HCL 50 MG PO TABS: 50.0000 mg | ORAL_TABLET | Freq: Four times a day (QID) | ORAL | Status: DC | PRN

## 2013-10-14 MED ORDER — CYANOCOBALAMIN 1000 MCG/ML IJ SOLN
1000.0000 ug | Freq: Once | INTRAMUSCULAR | Status: AC
  Administered 2013-10-14: 1000 ug via INTRAMUSCULAR

## 2013-10-14 NOTE — Patient Instructions (Signed)
Stay on clear liquids for 48-72 hours or until bowels are normal.This would include  jello, sherbert (NOT ice cream), Lipton's chicken noodle soup(NOT cream based soups),Gatorade Lite, flat Ginger ale (without High Fructose Corn Syrup),dry toast or crackers, baked potato.No milk , dairy or grease until symptoms gone. Align , a [employer] , daily if stools are loose. Immodium AD for frankly watery stool. Report increasing pain, fever or rectal bleeding

## 2013-10-14 NOTE — Progress Notes (Signed)
Pre visit review using our clinic review tool, if applicable. No additional management support is needed unless otherwise documented below in the visit note. 

## 2013-10-14 NOTE — Progress Notes (Signed)
   Subjective:    Patient ID: Vanessa [name], female    DOB: 1960/10/07, 53 y.o.   MRN: [id number]  HPI Abdominal pain began on Thursday 10/10/13 after work.  It has worsened since then. Pain is described as abdominal cramps and "severe" pain in the suprapubic region. She states it is a 5-8/10 and lasts hours until she is able to lie down.  She took  4 Advil OTC around 0830 and 1230 with some  benefit.  She has had constipation and describes a small hard stool this morning. The pain radiates into her groin with walking ;lying down relieves the pain.   She has not been exposed to any sick individuals. She has no significant surgery except C section.She drinks 2-3 cups of coffee in the morning and eats peppermint and drinks soda occasionally. She last had a gynecological check up in 2015 with Dr. Toney Rakes. She had a colonoscopy in 5/14 with removal of two small polyps.   She is requesting her B12 shot today rather than having to come back.  Review of Systems She denies nausea, vomiting, diarrhea, black or tarry stools. Denies food sticking in throat, heart burn, loss of appetite, blood in urine/stool, weight loss/gain, fever chills, sweats, pain with urination, and blood in urine.        Objective:   Physical Exam General appearance is one of good health and nourishment w/o distress.  Eyes: No conjunctival inflammation or scleral icterus is present.  Oral exam: Dental hygiene is good; lips and gums are healthy appearing.There is no oropharyngeal erythema or exudate noted.   Heart:  Normal rate and regular rhythm. S1 and S2 normal without gallop, murmur, click, rub or other extra sounds     Lungs:Chest clear to auscultation; no wheezes, rhonchi,rales ,or rubs present.No increased work of breathing.   Abdomen: bowel sounds normal, soft but slightly tender over the suprapubic area and to a lesser extent in both lower quadrants without masses, organomegaly or hernias noted.  No guarding or  rebound . No tenderness over the flanks to percussion  Musculoskeletal: Able to lie flat and sit up without help. Negative straight leg raising bilaterally. Gait normal  Skin:Warm & dry.  Intact without suspicious lesions or rashes ; no jaundice or tenting  Lymphatic: No lymphadenopathy is noted about the head, neck, axilla       Assessment & Plan:  #1 suprapubic discomfort with bloating. By history she has had a fairly current gynecologic exam which was negative.  #2 microscopic hematuria on urine  #3 B12 deficiency  See orders and recommendations

## 2013-10-15 ENCOUNTER — Ambulatory Visit (INDEPENDENT_AMBULATORY_CARE_PROVIDER_SITE_OTHER): Payer: BC Managed Care – PPO | Admitting: Women's Health

## 2013-10-15 DIAGNOSIS — R319 Hematuria, unspecified: Secondary | ICD-10-CM

## 2013-10-15 DIAGNOSIS — B373 Candidiasis of vulva and vagina: Secondary | ICD-10-CM

## 2013-10-15 DIAGNOSIS — B3731 Acute candidiasis of vulva and vagina: Secondary | ICD-10-CM

## 2013-10-15 LAB — URINALYSIS W MICROSCOPIC + REFLEX CULTURE
Bilirubin Urine: NEGATIVE
CASTS: NONE SEEN
CRYSTALS: NONE SEEN
GLUCOSE, UA: NEGATIVE mg/dL
KETONES UR: NEGATIVE mg/dL
Leukocytes, UA: NEGATIVE
NITRITE: NEGATIVE
PH: 5 (ref 5.0–8.0)
Protein, ur: 30 mg/dL — AB
Specific Gravity, Urine: 1.02 (ref 1.005–1.030)
Urobilinogen, UA: 0.2 mg/dL (ref 0.0–1.0)

## 2013-10-15 LAB — WET PREP FOR TRICH, YEAST, CLUE
Clue Cells Wet Prep HPF POC: NONE SEEN
Trich, Wet Prep: NONE SEEN

## 2013-10-15 LAB — URINE CULTURE
COLONY COUNT: NO GROWTH
ORGANISM ID, BACTERIA: NO GROWTH

## 2013-10-15 MED ORDER — FLUCONAZOLE 150 MG PO TABS: 150.0000 mg | ORAL_TABLET | Freq: Once | ORAL | Status: DC

## 2013-10-15 NOTE — Patient Instructions (Signed)

## 2013-10-15 NOTE — Progress Notes (Signed)
Patient ID: MERRIDY PASCOE, female   DOB: [birth year], 53 y.o.   MRN: [id number] Presents today with complaint of sever low abdominal pain for the past week. Reports  cramping and bloting. rates 8/10 pain 10/14/13. States pain is relieved with ibuprofen. Evaluated by PCP 10/14/13 was Dx with blood in urine treated with tramadol and was refered to GYN. Denies N/V, urinary symptoms, fever. Has had problems with constipation  for years, has intensified in the past few months, reports one small hard stool yesterday. 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  Same partner. Postmenopausal/no HRT/ no bleeding.  Exam: Appears well, comfortable. Abdomen soft with no rebound or radiation of pain. External genitalia within normal limits, minimal white discharge noted, wet prep positive for yeast. Bimanual cervical motion tenderness or adnexal fullness or tenderness.Marland Kitchen UA: Trace blood, 0 did 2 RBCs, few bacteria,  Yeast vaginitis Constipation  Plan: Diflucan 150 times one dose with 1 refill. Long discussion on increasing fiber rich foods in diet, MiraLax up to 3 times daily until stools are soft and regular. Reviewed importance of taking time for bathroom breaks at work. If abdominal pain persists after constipation relieved will check gyn ultrasound. Note written to excuse from work. Urine culture pending.

## 2013-10-16 LAB — URINE CULTURE
COLONY COUNT: NO GROWTH
Organism ID, Bacteria: NO GROWTH

## 2013-11-25 ENCOUNTER — Encounter: Payer: Self-pay | Admitting: Internal Medicine

## 2013-11-25 ENCOUNTER — Ambulatory Visit (INDEPENDENT_AMBULATORY_CARE_PROVIDER_SITE_OTHER): Payer: BC Managed Care – PPO | Admitting: Internal Medicine

## 2013-11-25 VITALS — BP 132/82 | HR 66 | Temp 97.7°F | Wt 173.5 lb

## 2013-11-25 DIAGNOSIS — J209 Acute bronchitis, unspecified: Secondary | ICD-10-CM

## 2013-11-25 DIAGNOSIS — J069 Acute upper respiratory infection, unspecified: Secondary | ICD-10-CM

## 2013-11-25 DIAGNOSIS — E538 Deficiency of other specified B group vitamins: Secondary | ICD-10-CM

## 2013-11-25 MED ORDER — AMOXICILLIN 500 MG PO CAPS: 500.0000 mg | ORAL_CAPSULE | Freq: Three times a day (TID) | ORAL | Status: DC

## 2013-11-25 MED ORDER — CYANOCOBALAMIN 1000 MCG/ML IJ SOLN
1000.0000 ug | Freq: Once | INTRAMUSCULAR | Status: AC
  Administered 2013-11-25: 1000 ug via INTRAMUSCULAR

## 2013-11-25 NOTE — Progress Notes (Signed)
   Subjective:    Patient ID: Vanessa [name], female    DOB: 23-Nov-1960, 53 y.o.   MRN: [id number]  HPI   Symptoms began 11/19/13 ast some discomfort in the left temporal area associated with nasal congestion and postnasal drainage . She describes crusty material  In her upon awakening.  She also describes pressure in ears, particularly on the right associated with sore throat and generalized malaise  She diduse Zyrtec and Mucinex and subsequently Claritin with only partial response  She did learn that her mother-in-law has had a respiratory tract infection  At this time the major symptoms include puffiness in her face as well as extrinsic symptoms of itchy, watery eyes, and sneezing  She does believe that she had a low-grade fever over the weekend.  At this time she has some cough with minimal sputum production  She does smoke a half-pack a day.  At this time there are no significant symptoms of rhinosinusitis.    Review of Systems Frontal headache, facial pain , nasal purulence, dental pain,  or otic discharge denied. No chills or sweats.     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 Extraocular motion is intact. She has unsustained left lateral nystagmus. There is no sign of conjunctivitis clinically. Vision is normal to confrontation. There is mild tonsillar hypertrophy without exudate. There is more erythema of the right posterior oropharynx than the left. She has scattered minimal pops and wheezes with no increased work of breathing.  General appearance:good health ;well nourished; no acute distress or increased work of breathing is present.  No  lymphadenopathy about the head, neck, or axilla noted.  Eyes: No conjunctival inflammation or lid edema is present. There is no scleral icterus. Ears:  External ear exam shows no significant lesions or deformities.  Otoscopic examination reveals clear canals,  tympanic membranes are intact bilaterally without bulging, retraction, inflammation or discharge. Nose:  External nasal examination shows no deformity or inflammation. Nasal mucosa are pink and moist without lesions or exudates. No septal dislocation or deviation.No obstruction to airflow.  Oral exam: Dental hygiene is good; lips and gums are healthy appearing.There is no oropharyngeal erythema or exudate noted.  Neck:  No deformities, thyromegaly, masses, or tenderness noted.   Supple with full range of motion without pain.  Heart:  Normal rate and regular rhythm. S1 and S2 normal without gallop, murmur, click, rub or other extra sounds.  Lungs:.No increased work of breathing.   Extremities:  No cyanosis, edema, or clubbing  noted  Skin: Warm & dry w/o jaundice or tenting.         Assessment & Plan:  #1  URI, acute #2acute bronchitis with minor bronchospasm #3 smoker Plan: See orders and recommendations

## 2013-11-25 NOTE — Progress Notes (Signed)
Pre visit review using our clinic review tool, if applicable. No additional management support is needed unless otherwise documented below in the visit note. 

## 2013-11-25 NOTE — Patient Instructions (Signed)
Plain Mucinex (NOT D) for thick secretions ;force NON dairy fluids .   Nasal cleansing in the shower as discussed with lather of mild shampoo.After 10 seconds wash off lather while  exhaling through nostrils. Make sure that all residual soap is removed to prevent irritation.  Flonase OR Nasacort AQ 1 spray in each nostril twice a day as needed. Use the "crossover" technique into opposite nostril spraying toward opposite ear @ 45 degree angle, not straight up into nostril.  Use a Neti pot daily only  as needed for significant sinus congestion; going from open side to congested side . Plain Allegra (NOT D )  160 daily , Loratidine 10 mg , OR Zyrtec 10 mg @ bedtime  as needed for itchy eyes & sneezing. Please think about quitting smoking. Review the risks we discussed. Please call 1-800-QUIT-NOW [phone]) for free smoking cessation counseling.

## 2013-12-09 ENCOUNTER — Encounter: Payer: Self-pay | Admitting: Gynecology

## 2013-12-23 ENCOUNTER — Other Ambulatory Visit: Payer: Self-pay | Admitting: Internal Medicine

## 2013-12-23 NOTE — Telephone Encounter (Signed)
MD is out pls advise on refill. Pls send back to [place]...Vanessa [name]

## 2013-12-23 NOTE — Telephone Encounter (Signed)
Will refill to be called in for xanax 0.25 mg #30 no refills take 1 daily prn anxiety. She was given 180 pills 6 months ago and appears to use about 30 per month.

## 2013-12-23 NOTE — Telephone Encounter (Signed)
Notified [facility] spoke with Crystal gave md authorization for alprazolam.../lmb

## 2014-01-09 ENCOUNTER — Ambulatory Visit: Payer: BC Managed Care – PPO

## 2014-01-10 ENCOUNTER — Ambulatory Visit (INDEPENDENT_AMBULATORY_CARE_PROVIDER_SITE_OTHER): Payer: BC Managed Care – PPO | Admitting: *Deleted

## 2014-01-10 DIAGNOSIS — E538 Deficiency of other specified B group vitamins: Secondary | ICD-10-CM

## 2014-01-10 MED ORDER — CYANOCOBALAMIN 1000 MCG/ML IJ SOLN
1000.0000 ug | Freq: Once | INTRAMUSCULAR | Status: AC
  Administered 2014-01-10: 1000 ug via INTRAMUSCULAR

## 2014-01-27 ENCOUNTER — Encounter: Payer: Self-pay | Admitting: Internal Medicine

## 2014-01-30 ENCOUNTER — Other Ambulatory Visit: Payer: Self-pay | Admitting: *Deleted

## 2014-01-30 ENCOUNTER — Telehealth: Payer: Self-pay | Admitting: Internal Medicine

## 2014-01-30 MED ORDER — ERYTHROMYCIN 5 MG/GM OP OINT: TOPICAL_OINTMENT | Freq: Every day | OPHTHALMIC | Status: DC

## 2014-01-30 NOTE — Telephone Encounter (Signed)
Patient would like to know if the antibiotic ointment you prescribed for her eye can be refilled.

## 2014-01-30 NOTE — Telephone Encounter (Signed)
Pt stated they stye is starting to come back wanting to ger refill on the erythmyocin oint that md rx. Inform [facility].../lmb

## 2014-02-10 ENCOUNTER — Ambulatory Visit: Payer: BC Managed Care – PPO | Admitting: Internal Medicine

## 2014-02-10 ENCOUNTER — Ambulatory Visit (INDEPENDENT_AMBULATORY_CARE_PROVIDER_SITE_OTHER): Payer: BC Managed Care – PPO | Admitting: Internal Medicine

## 2014-02-10 ENCOUNTER — Encounter: Payer: Self-pay | Admitting: Internal Medicine

## 2014-02-10 VITALS — BP 120/84 | HR 80 | Temp 98.3°F | Wt 177.0 lb

## 2014-02-10 DIAGNOSIS — J0100 Acute maxillary sinusitis, unspecified: Secondary | ICD-10-CM

## 2014-02-10 MED ORDER — AMOXICILLIN-POT CLAVULANATE 875-125 MG PO TABS: 1.0000 | ORAL_TABLET | Freq: Two times a day (BID) | ORAL | Status: DC

## 2014-02-10 NOTE — Patient Instructions (Signed)

## 2014-02-10 NOTE — Progress Notes (Signed)
HPI  Pt presents to the clinic today with c/o runny nose, chills and body aches. She reports this started 7 days ago. She is blowing green mucous out of her nose. She has had some associated headache and facial pressure. She has tried Zyrtec, Mucinex and Tylenol cold and sinus. She does have a history of allergies but denies breathing problems. She has not had sick contacts that she is aware of. She does smoke.  Review of Systems      Past Medical History  Diagnosis Date  . Hyperlipidemia   . Cyst     gangeous- left hand  . Tobacco abuse   . Allergy     rhinitis  . Hypothyroidism   . Anxiety   . Depression   . B12 deficiency     Family History  Problem Relation Age of Onset  . Thyroid disease Mother     Low  . Other Mother     B12 deficiency  . Coronary artery disease Other   . Hyperlipidemia Other   . Heart disease Father 40    CAD  . Colon cancer Neg Hx     History   Social History  . Marital Status: Married    Spouse Name: N/A    Number of Children: N/A  . Years of Education: N/A   Occupational History  . Not on file.   Social History Main Topics  . Smoking status: Current Every Day Smoker -- 0.25 packs/day    Types: Cigarettes  . Smokeless tobacco: Never Used  . Alcohol Use: No  . Drug Use: No  . Sexual Activity: Yes   Other Topics Concern  . Not on file   Social History Narrative    Allergies  Allergen Reactions  . Atorvastatin     REACTION: aches  . Ezetimibe-Simvastatin     REACTION: hives     Constitutional: Positive headache, fatigue and fever. Denies abrupt weight changes.  HEENT:  Positive runny nose, facial pain and pressure, sore throat. Denies eye redness, eye pain, pressure behind the eyes, nasal congestion, ear pain, ringing in the ears, wax buildup, or bloody nose. Respiratory:  Denies cough, difficulty breathing or shortness of breath.  Cardiovascular: Denies chest pain, chest tightness, palpitations or swelling in the hands or  feet.   No other specific complaints in a complete review of systems (except as listed in HPI above).  Objective:   BP 120/84 mmHg  Pulse 80  Temp(Src) 98.3 F (36.8 C) (Oral)  Wt 177 lb (80.287 kg)  SpO2 98%  LMP 03/08/2006 Wt Readings from Last 3 Encounters:  02/10/14 177 lb (80.287 kg)  11/25/13 173 lb 8 oz (78.699 kg)  10/14/13 172 lb 9.6 oz (78.291 kg)     General: Appears her stated age, well developed, well nourished in NAD. HEENT: Head: normal shape and size, maxillary sinus tenderness noted; ; Ears: Tm's gray and intact, normal light reflex; Nose: boggy and moist, septum midline; Throat/Mouth: + PND. Teeth present, mucosa erythematous and moist, no exudate noted, no lesions or ulcerations noted.  Cardiovascular: Normal rate and rhythm. S1,S2 noted.  No murmur, rubs or gallops noted.  Pulmonary/Chest: Normal effort and coarse vesicular breath sounds. No respiratory distress. No wheezes, rales or ronchi noted.      Assessment & Plan:   Acute Maxillary sinusitis:  Get some rest and drink plenty of water Do salt water gargles for the sore throat Ibuprofen for chills and body aches eRx for Augmentin BID x 10  days Stop smoking   RTC as needed or if symptoms persist.

## 2014-02-10 NOTE — Progress Notes (Signed)
Pre visit review using our clinic review tool, if applicable. No additional management support is needed unless otherwise documented below in the visit note. 

## 2014-02-17 ENCOUNTER — Encounter: Payer: BC Managed Care – PPO | Admitting: Internal Medicine

## 2014-02-18 ENCOUNTER — Other Ambulatory Visit: Payer: Self-pay | Admitting: Internal Medicine

## 2014-02-19 ENCOUNTER — Telehealth: Payer: Self-pay | Admitting: Internal Medicine

## 2014-02-19 ENCOUNTER — Other Ambulatory Visit: Payer: Self-pay | Admitting: Internal Medicine

## 2014-02-19 NOTE — Telephone Encounter (Signed)
Pt has called numerous times over the last two days regarding Xanex and now her thyroid medicine. Pt is out of Xanex and has two thyroid medicine pills. Please let her know status she is afraid with the holiday and fast approaching. [phone] Vanessa [name].

## 2014-02-19 NOTE — Telephone Encounter (Signed)
sch ov Thx 

## 2014-02-19 NOTE — Telephone Encounter (Signed)
Rx request sent to MD for review. Closing this phone note.

## 2014-03-06 ENCOUNTER — Encounter: Payer: BC Managed Care – PPO | Admitting: Internal Medicine

## 2014-04-04 ENCOUNTER — Encounter: Payer: Self-pay | Admitting: Internal Medicine

## 2014-04-04 ENCOUNTER — Ambulatory Visit (INDEPENDENT_AMBULATORY_CARE_PROVIDER_SITE_OTHER): Payer: BC Managed Care – PPO | Admitting: Internal Medicine

## 2014-04-04 ENCOUNTER — Other Ambulatory Visit (INDEPENDENT_AMBULATORY_CARE_PROVIDER_SITE_OTHER): Payer: BC Managed Care – PPO

## 2014-04-04 ENCOUNTER — Ambulatory Visit (INDEPENDENT_AMBULATORY_CARE_PROVIDER_SITE_OTHER)
Admission: RE | Admit: 2014-04-04 | Discharge: 2014-04-04 | Disposition: A | Discharge status: Home / Self Care | Payer: BC Managed Care – PPO | Source: Ambulatory Visit | Attending: Internal Medicine | Admitting: Internal Medicine

## 2014-04-04 VITALS — BP 116/80 | HR 73 | Temp 98.4°F | Ht 62.0 in | Wt 179.0 lb

## 2014-04-04 DIAGNOSIS — E538 Deficiency of other specified B group vitamins: Secondary | ICD-10-CM

## 2014-04-04 DIAGNOSIS — F17200 Nicotine dependence, unspecified, uncomplicated: Secondary | ICD-10-CM

## 2014-04-04 DIAGNOSIS — M79644 Pain in right finger(s): Secondary | ICD-10-CM

## 2014-04-04 DIAGNOSIS — E785 Hyperlipidemia, unspecified: Secondary | ICD-10-CM

## 2014-04-04 DIAGNOSIS — Z0000 Encounter for general adult medical examination without abnormal findings: Secondary | ICD-10-CM

## 2014-04-04 DIAGNOSIS — E034 Atrophy of thyroid (acquired): Secondary | ICD-10-CM

## 2014-04-04 LAB — URINALYSIS
BILIRUBIN URINE: NEGATIVE
Hgb urine dipstick: NEGATIVE
Ketones, ur: NEGATIVE
LEUKOCYTES UA: NEGATIVE
Nitrite: NEGATIVE
PH: 5.5 (ref 5.0–8.0)
SPECIFIC GRAVITY, URINE: 1.025 (ref 1.000–1.030)
Urine Glucose: NEGATIVE
Urobilinogen, UA: 0.2 (ref 0.0–1.0)

## 2014-04-04 LAB — CBC WITH DIFFERENTIAL/PLATELET
Basophils Absolute: 0.1 10*3/uL (ref 0.0–0.1)
Basophils Relative: 0.7 % (ref 0.0–3.0)
Eosinophils Absolute: 0.6 10*3/uL (ref 0.0–0.7)
Eosinophils Relative: 6 % — ABNORMAL HIGH (ref 0.0–5.0)
HCT: 41.7 % (ref 36.0–46.0)
Hemoglobin: 13.9 g/dL (ref 12.0–15.0)
Lymphocytes Relative: 22.2 % (ref 12.0–46.0)
Lymphs Abs: 2.1 10*3/uL (ref 0.7–4.0)
MCHC: 33.4 g/dL (ref 30.0–36.0)
MCV: 86.8 fl (ref 78.0–100.0)
Monocytes Absolute: 0.6 10*3/uL (ref 0.1–1.0)
Monocytes Relative: 6.7 % (ref 3.0–12.0)
Neutro Abs: 6.1 10*3/uL (ref 1.4–7.7)
Neutrophils Relative %: 64.4 % (ref 43.0–77.0)
Platelets: 307 10*3/uL (ref 150.0–400.0)
RBC: 4.8 Mil/uL (ref 3.87–5.11)
RDW: 14.6 % (ref 11.5–15.5)
WBC: 9.4 10*3/uL (ref 4.0–10.5)

## 2014-04-04 LAB — TSH: TSH: 4.81 u[IU]/mL — AB (ref 0.35–4.50)

## 2014-04-04 LAB — BASIC METABOLIC PANEL
BUN: 16 mg/dL (ref 6–23)
CO2: 24 mEq/L (ref 19–32)
Calcium: 9.1 mg/dL (ref 8.4–10.5)
Chloride: 109 mEq/L (ref 96–112)
Creatinine, Ser: 0.9 mg/dL (ref 0.4–1.2)
GFR: 70.27 mL/min (ref >60.00)
Glucose, Bld: 92 mg/dL (ref 70–99)
Potassium: 4.2 mEq/L (ref 3.5–5.1)
Sodium: 139 mEq/L (ref 135–145)

## 2014-04-04 LAB — LIPID PANEL
Cholesterol: 327 mg/dL — ABNORMAL HIGH (ref 0–200)
HDL: 38.1 mg/dL — ABNORMAL LOW (ref >39.00)
LDL Cholesterol: 263 mg/dL — ABNORMAL HIGH (ref 0–99)
NonHDL: 288.9
Total CHOL/HDL Ratio: 9
Triglycerides: 131 mg/dL (ref 0.0–149.0)
VLDL: 26.2 mg/dL (ref 0.0–40.0)

## 2014-04-04 LAB — HEPATIC FUNCTION PANEL
ALT: 24 U/L (ref 0–35)
AST: 25 U/L (ref 0–37)
Albumin: 4.1 g/dL (ref 3.5–5.2)
Alkaline Phosphatase: 59 U/L (ref 39–117)
Bilirubin, Direct: 0 mg/dL (ref 0.0–0.3)
Total Bilirubin: 0.6 mg/dL (ref 0.2–1.2)
Total Protein: 8.2 g/dL (ref 6.0–8.3)

## 2014-04-04 MED ORDER — LEVOTHYROXINE SODIUM 75 MCG PO TABS: 75.0000 ug | ORAL_TABLET | Freq: Every day | ORAL | Status: DC

## 2014-04-04 MED ORDER — ROSUVASTATIN CALCIUM 20 MG PO TABS: 20.0000 mg | ORAL_TABLET | Freq: Every day | ORAL | Status: DC

## 2014-04-04 MED ORDER — ALPRAZOLAM 0.25 MG PO TABS: 0.2500 mg | ORAL_TABLET | Freq: Two times a day (BID) | ORAL | Status: DC | PRN

## 2014-04-04 MED ORDER — CYANOCOBALAMIN 1000 MCG/ML IJ SOLN
1000.0000 ug | Freq: Once | INTRAMUSCULAR | Status: AC
  Administered 2014-04-04: 1000 ug via INTRAMUSCULAR

## 2014-04-04 NOTE — Assessment & Plan Note (Signed)
We discussed age appropriate health related issues, including available/recomended screening tests and vaccinations. We discussed a need for adhering to healthy diet and exercise. Labs/EKG were reviewed/ordered. All questions were answered.   

## 2014-04-04 NOTE — Assessment & Plan Note (Signed)
B12 inj today Continue with current prescription therapy as reflected on the Med list.

## 2014-04-04 NOTE — Progress Notes (Signed)
Pre visit review using our clinic review tool, if applicable. No additional management support is needed unless otherwise documented below in the visit note. 

## 2014-04-04 NOTE — Progress Notes (Signed)
Subjective:     HPI  The patient is here for a wellness exam. The patient has been doing well overall without major physical or psychological issues going on lately. 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  Negative for chills, diaphoresis, activity change, appetite change, fatigue and unexpected weight change.  HENT: Negative for congestion, ear pain, facial swelling, hearing loss, mouth sores, nosebleeds, postnasal drip, rhinorrhea, sinus pressure, sneezing, sore throat, tinnitus and trouble swallowing.   Eyes: Negative for pain, discharge, redness, itching and visual disturbance.  Respiratory: Negative for cough, chest tightness, shortness of breath, wheezing and stridor.   Cardiovascular: Negative for palpitations and leg swelling.  Gastrointestinal: Negative for nausea, diarrhea, constipation, blood in stool, abdominal distention, anal bleeding and rectal pain.  Genitourinary: Negative for urgency, frequency, hematuria, flank pain, vaginal bleeding, vaginal discharge, difficulty urinating, genital sores and vaginal pain.  Musculoskeletal: Negative for joint swelling, arthralgias, gait problem, neck pain and neck stiffness.  Skin: Negative.  Negative for rash.  Neurological: Negative for dizziness, tremors, seizures, syncope and speech difficulty.  Hematological: Negative for adenopathy. Does not bruise/bleed easily.  Psychiatric/Behavioral: Negative for suicidal ideas, behavioral problems, sleep disturbance, dysphoric mood and decreased concentration. The patient is not nervous/anxious.        Objective:   Physical Exam   Constitutional: She appears well-developed. No distress.  HENT:  Head: Normocephalic.  Right Ear: External ear normal.  Left Ear: External ear normal.  Nose: Nose normal.  Mouth/Throat: Oropharynx is clear and moist.  Eyes: Conjunctivae are normal. Pupils are equal, round, and reactive to light. Right eye exhibits no discharge. Left eye exhibits no discharge.  Neck: Normal range of motion. Neck supple. No JVD present. No tracheal deviation present. No thyromegaly present.  Cardiovascular: Normal rate, regular rhythm and normal heart sounds.   Pulmonary/Chest: No stridor. No respiratory distress. She has no wheezes.  Abdominal: Soft. Bowel sounds are normal. She exhibits no distension and no mass. There is no tenderness. There is no rebound and no guarding.  Musculoskeletal: She exhibits no edema or tenderness.  Lymphadenopathy:    She has no cervical adenopathy.  Neurological: She displays normal reflexes. No cranial nerve deficit. She exhibits normal muscle tone. Coordination normal.  Skin: No rash noted. No erythema.  Psychiatric: She has a normal mood and affect. Her behavior is normal. Judgment and thought content normal.  R thumb hurts at base  Lab Results  Component Value Date   WBC 15.8* 10/14/2013   HGB 14.5 10/14/2013   HCT 42.2 10/14/2013   PLT 367.0 10/14/2013   GLUCOSE 86 01/22/2013   CHOL 224* 01/22/2013   TRIG 237.0* 01/22/2013   HDL 37.00* 01/22/2013   LDLDIRECT 153.7 01/22/2013   LDLCALC 143* 01/06/2012   ALT 20 01/22/2013   AST 22 01/22/2013   NA 138 01/22/2013   K 4.1 01/22/2013   CL 105 01/22/2013   CREATININE 0.8 01/22/2013   BUN 14 01/22/2013   CO2 27 01/22/2013   TSH 1.48 01/22/2013   HGBA1C 5.7 01/22/2013          Assessment &  Plan:

## 2014-04-04 NOTE — Assessment & Plan Note (Signed)
Off Crestor now. Re-start Crestor

## 2014-04-04 NOTE — Assessment & Plan Note (Signed)
Continue with current prescription therapy as reflected on the Med list.  

## 2014-04-04 NOTE — Assessment & Plan Note (Signed)
Discussed.

## 2014-04-04 NOTE — Assessment & Plan Note (Addendum)
Advil prn X ray

## 2014-04-06 ENCOUNTER — Other Ambulatory Visit: Payer: Self-pay | Admitting: Internal Medicine

## 2014-04-06 MED ORDER — LEVOTHYROXINE SODIUM 88 MCG PO TABS: 88.0000 ug | ORAL_TABLET | Freq: Every day | ORAL | Status: DC

## 2014-04-08 ENCOUNTER — Encounter: Payer: Self-pay | Admitting: Internal Medicine

## 2014-04-08 ENCOUNTER — Ambulatory Visit: Payer: BC Managed Care – PPO | Admitting: Internal Medicine

## 2014-04-08 NOTE — Telephone Encounter (Signed)
Pt has made appt for Thurs 04/10/14 @ 9:30...Vanessa [name]

## 2014-04-09 ENCOUNTER — Other Ambulatory Visit: Payer: BC Managed Care – PPO

## 2014-04-09 ENCOUNTER — Telehealth: Payer: Self-pay | Admitting: *Deleted

## 2014-04-09 ENCOUNTER — Encounter: Payer: Self-pay | Admitting: Internal Medicine

## 2014-04-09 ENCOUNTER — Ambulatory Visit (INDEPENDENT_AMBULATORY_CARE_PROVIDER_SITE_OTHER): Payer: BC Managed Care – PPO | Admitting: Internal Medicine

## 2014-04-09 VITALS — BP 146/98 | HR 80 | Temp 98.4°F

## 2014-04-09 DIAGNOSIS — B351 Tinea unguium: Secondary | ICD-10-CM

## 2014-04-09 DIAGNOSIS — E785 Hyperlipidemia, unspecified: Secondary | ICD-10-CM

## 2014-04-09 DIAGNOSIS — L02211 Cutaneous abscess of abdominal wall: Secondary | ICD-10-CM

## 2014-04-09 MED ORDER — SULFAMETHOXAZOLE-TRIMETHOPRIM 800-160 MG PO TABS: 1.0000 | ORAL_TABLET | Freq: Two times a day (BID) | ORAL | Status: DC

## 2014-04-09 MED ORDER — CICLOPIROX 8 % EX SOLN: Freq: Every day | CUTANEOUS | Status: DC

## 2014-04-09 NOTE — Progress Notes (Signed)
Pre visit review using our clinic review tool, if applicable. No additional management support is needed unless otherwise documented below in the visit note. 

## 2014-04-09 NOTE — Progress Notes (Signed)
Subjective:     HPI  C/o a boil on upper abdomen x 4 d - painful  She took Lamisil po for toenail fungus (Dr Magnus Ivan) and did not take Crestor. Now Quorra has stopped Lamisil and re-started Crestor. 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  Negative for chills, diaphoresis, activity change, appetite change, fatigue and unexpected weight change.  HENT: Negative for congestion, ear pain, facial swelling, hearing loss, mouth sores, nosebleeds, postnasal drip, rhinorrhea, sinus pressure, sneezing, sore throat, tinnitus and trouble swallowing.   Eyes: Negative for pain, discharge, redness, itching and visual disturbance.  Respiratory: Negative for cough, chest tightness, shortness of breath, wheezing and stridor.   Cardiovascular: Negative for palpitations and leg swelling.  Gastrointestinal: Negative for nausea, diarrhea, constipation, blood in stool, abdominal distention, anal bleeding and rectal pain.  Genitourinary: Negative for urgency, frequency, hematuria, flank pain, vaginal bleeding, vaginal discharge, difficulty urinating, genital sores and vaginal pain.  Musculoskeletal: Negative for joint swelling, arthralgias, gait problem, neck pain and neck stiffness.  Skin: Negative.  Negative for rash.  Neurological: Negative for dizziness, tremors, seizures, syncope and speech difficulty.  Hematological: Negative for adenopathy. Does not bruise/bleed easily.  Psychiatric/Behavioral: Negative for suicidal ideas, behavioral problems, sleep disturbance, dysphoric mood and decreased concentration. The patient is not nervous/anxious.        Objective:   Physical Exam  Constitutional: She appears  well-developed. No distress.  HENT:  Head: Normocephalic.  Right Ear: External ear normal.  Left Ear: External ear normal.  Nose: Nose normal.  Mouth/Throat: Oropharynx is clear and moist.  Eyes: Conjunctivae are normal. Pupils are equal, round, and reactive to light. Right eye exhibits no discharge. Left eye exhibits no discharge.  Neck: Normal range of motion. Neck supple. No JVD present. No tracheal deviation present. No thyromegaly present.  Cardiovascular: Normal rate, regular rhythm and normal heart sounds.   Pulmonary/Chest: No stridor. No respiratory distress. She has no wheezes.  Abdominal: Soft. Bowel sounds are normal. She exhibits no distension and no mass. There is no tenderness. There is no rebound and no guarding.  Musculoskeletal: She exhibits no edema or tenderness.  Lymphadenopathy:    She has no cervical adenopathy.  Neurological: She displays normal reflexes. No cranial nerve deficit. She exhibits normal muscle tone. Coordination normal.  Skin: No rash noted. No erythema.  Psychiatric: She has a normal mood and affect. Her behavior is normal. Judgment and thought content normal.  RUQ with 1.0x0.5 cm and a small one next to it laterally  Lab Results  Component Value Date   WBC 9.4 04/04/2014   HGB 13.9 04/04/2014   HCT 41.7 04/04/2014   PLT 307.0 04/04/2014   GLUCOSE 92 04/04/2014   CHOL 327* 04/04/2014   TRIG 131.0 04/04/2014   HDL 38.10* 04/04/2014   LDLDIRECT 153.7 01/22/2013   LDLCALC 263* 04/04/2014   ALT 24 04/04/2014   AST 25 04/04/2014   NA 139 04/04/2014   K 4.2 04/04/2014   CL 109 04/04/2014   CREATININE 0.9 04/04/2014   BUN 16 04/04/2014   CO2 24 04/04/2014   TSH 4.81* 04/04/2014   HGBA1C 5.7 01/22/2013    Procedure note:  Incision  and Drainage of an Abscess   Indication : a localized collection of pus that is tender and not spontaneously resolving.    Risks including unsuccessful procedure , possible need for a repeat procedure due to  pus accumulation, scar formation, and others as well as benefits were explained to the patient in detail. Written consent was obtained/signed.    The patient was placed in a decubitus position. The area of an abscess was prepped with povidone-iodine and draped in a sterile fashion. Local anesthesia with   0.5    cc of 2% lidocaine and epinephrine  was administered.  0.7 cm incision with #11strait blade was made. About 0.5 cc of purulent material was expressed. The abscess cavity was explored with a sterile hemostat and the walled- off pockets and septae were broken down bluntly. The cavity was irrigated with the rest of the anesthetic in the syringe.   The wound was dressed with antibiotic ointment and a large band aid.  Tolerated well. Complications: None.   Wound instructions provided.      Assessment & Plan:  Patient ID: Vanessa [name], female   DOB: 06-17-1960, 54 y.o.   MRN: [id number]

## 2014-04-09 NOTE — Assessment & Plan Note (Signed)
[place]  Pt wants to d/c Lamisil po -- ok w/me

## 2014-04-09 NOTE — Telephone Encounter (Signed)
Pt return call bck. She made appt for this afternoon @1 ...Vanessa [name]

## 2014-04-09 NOTE — Assessment & Plan Note (Signed)
Re-started Crestor

## 2014-04-09 NOTE — Assessment & Plan Note (Signed)
Bactrim DS bid Wound culture Procedure

## 2014-04-09 NOTE — Telephone Encounter (Signed)
Called pt no answer LMOM with md response, will also send mychart msg...Vanessa [name]

## 2014-04-10 ENCOUNTER — Ambulatory Visit: Payer: BC Managed Care – PPO | Admitting: Internal Medicine

## 2014-04-12 ENCOUNTER — Encounter: Payer: Self-pay | Admitting: Internal Medicine

## 2014-04-12 LAB — WOUND CULTURE
Gram Stain: NONE SEEN
Gram Stain: NONE SEEN

## 2014-04-16 ENCOUNTER — Other Ambulatory Visit: Payer: Self-pay | Admitting: Internal Medicine

## 2014-04-16 ENCOUNTER — Encounter: Payer: Self-pay | Admitting: Internal Medicine

## 2014-04-16 DIAGNOSIS — Z22322 Carrier or suspected carrier of Methicillin resistant Staphylococcus aureus: Secondary | ICD-10-CM

## 2014-04-16 IMAGING — CR DG RIBS 2V*R*
3 series · 3 of 3 positions shown · non-contrast
Comparison: None

CLINICAL DATA: Right posterior rib pain, 2 days duration, no known
injury, history smoking

RIGHT RIBS - 2 VIEW

[view not recorded (1 of 3)]
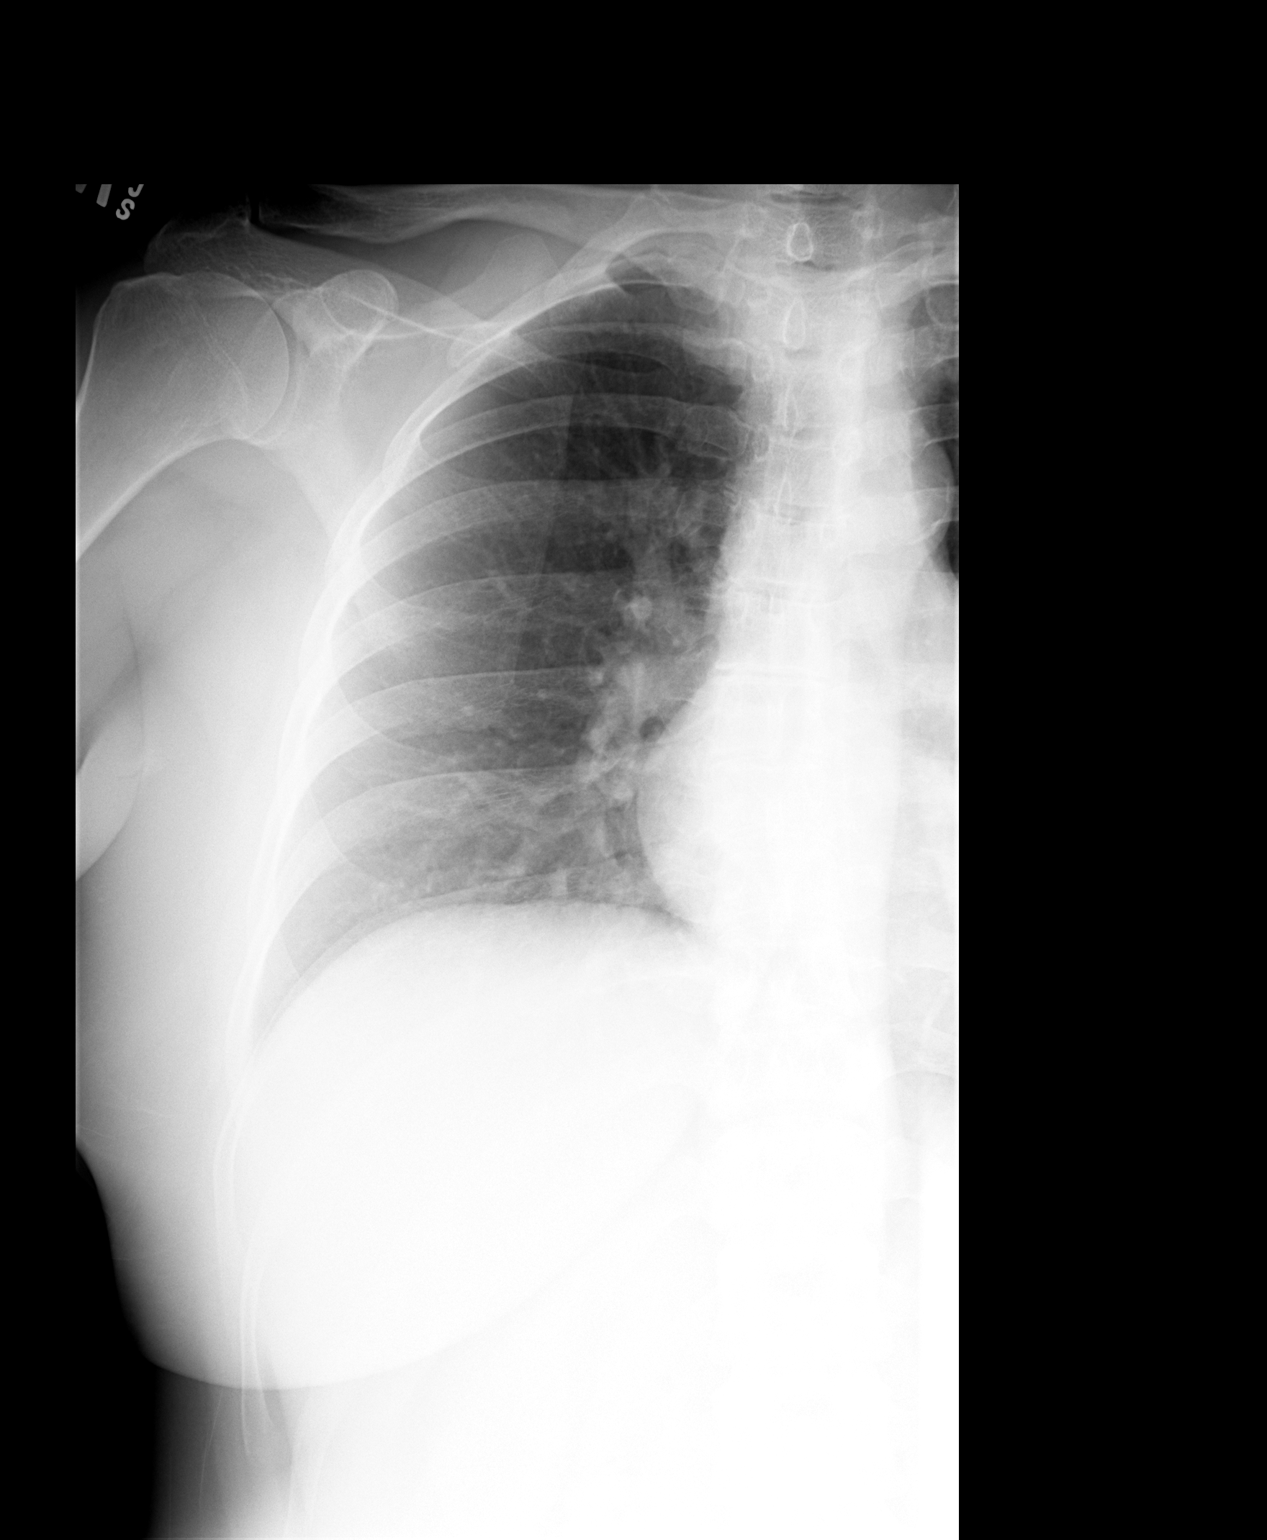

[view not recorded (2 of 3)]
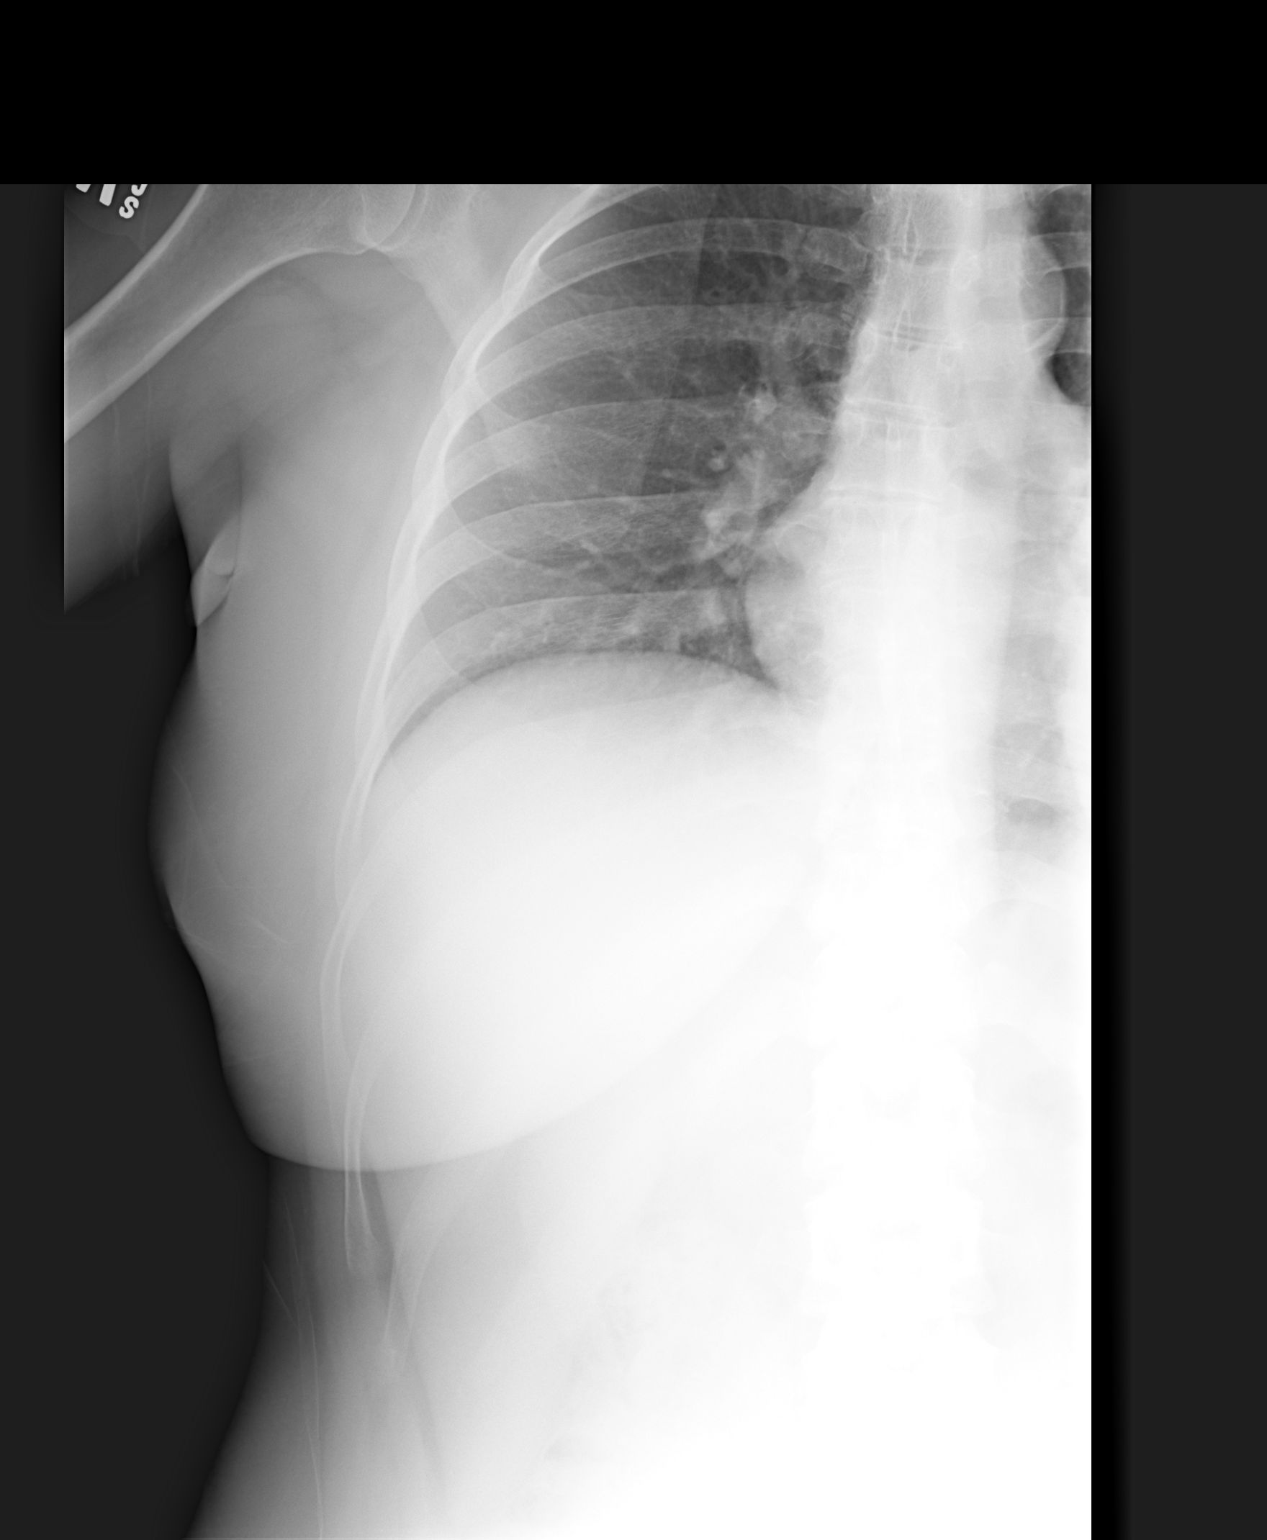

[view not recorded (3 of 3)]
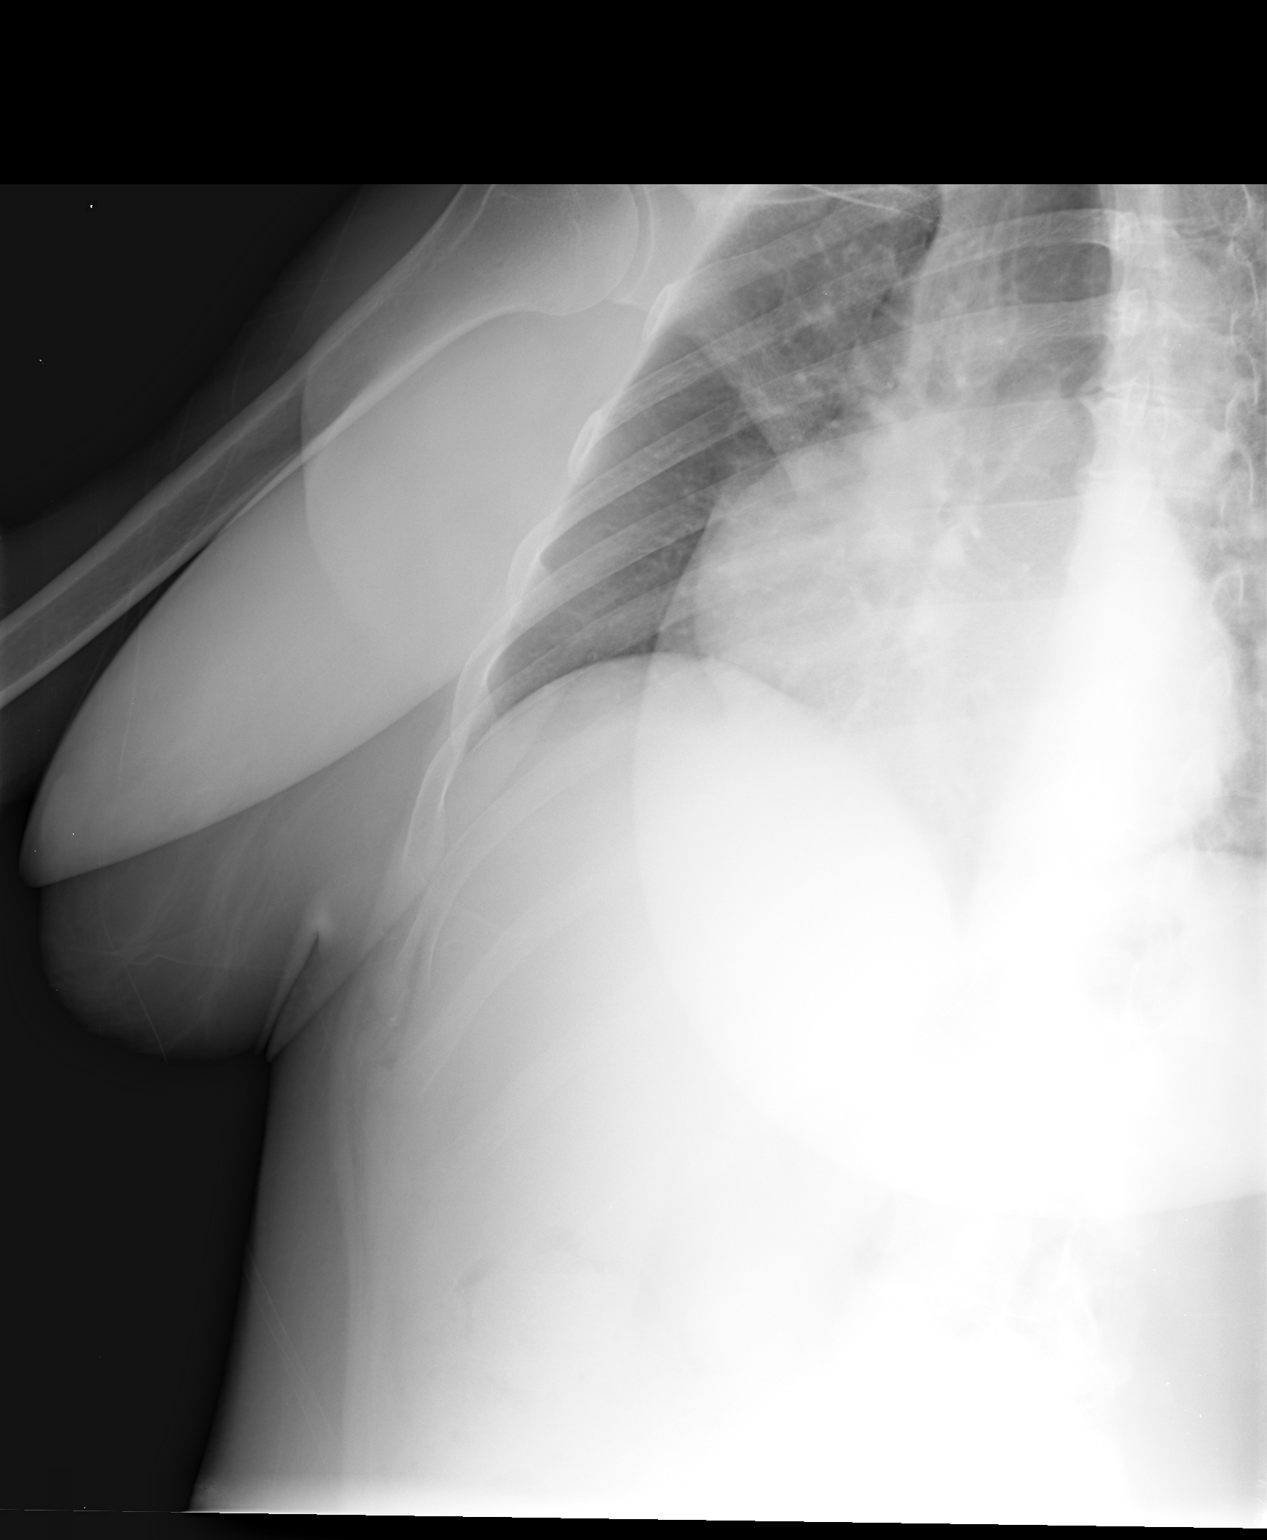

[3 of 3 positions shown; findings below may reference images not displayed]

FINDINGS: Osseous mineralization grossly normal for technique.
No rib fracture or bone destruction seen.
Atherosclerotic calcification noted at aortic arch.
IMPRESSION: No acute right rib abnormalities identified.

## 2014-04-20 ENCOUNTER — Encounter: Payer: Self-pay | Admitting: Internal Medicine

## 2014-04-21 ENCOUNTER — Encounter: Payer: Self-pay | Admitting: Internal Medicine

## 2014-04-23 ENCOUNTER — Ambulatory Visit (INDEPENDENT_AMBULATORY_CARE_PROVIDER_SITE_OTHER): Payer: BC Managed Care – PPO | Admitting: Internal Medicine

## 2014-04-23 ENCOUNTER — Other Ambulatory Visit: Payer: BC Managed Care – PPO

## 2014-04-23 ENCOUNTER — Encounter: Payer: Self-pay | Admitting: Family Medicine

## 2014-04-23 ENCOUNTER — Ambulatory Visit (INDEPENDENT_AMBULATORY_CARE_PROVIDER_SITE_OTHER): Payer: BC Managed Care – PPO | Admitting: Family Medicine

## 2014-04-23 ENCOUNTER — Encounter: Payer: Self-pay | Admitting: Internal Medicine

## 2014-04-23 ENCOUNTER — Telehealth: Payer: Self-pay | Admitting: Internal Medicine

## 2014-04-23 ENCOUNTER — Other Ambulatory Visit (INDEPENDENT_AMBULATORY_CARE_PROVIDER_SITE_OTHER): Payer: BC Managed Care – PPO

## 2014-04-23 VITALS — BP 140/92 | HR 67 | Temp 97.9°F | Wt 179.0 lb

## 2014-04-23 VITALS — BP 140/92 | HR 67 | Wt 179.0 lb

## 2014-04-23 DIAGNOSIS — M65311 Trigger thumb, right thumb: Secondary | ICD-10-CM

## 2014-04-23 DIAGNOSIS — M79644 Pain in right finger(s): Secondary | ICD-10-CM

## 2014-04-23 DIAGNOSIS — M65319 Trigger thumb, unspecified thumb: Secondary | ICD-10-CM | POA: Insufficient documentation

## 2014-04-23 DIAGNOSIS — R21 Rash and other nonspecific skin eruption: Secondary | ICD-10-CM | POA: Insufficient documentation

## 2014-04-23 MED ORDER — TRIAMCINOLONE ACETONIDE 0.5 % EX CREA: 1.0000 "application " | TOPICAL_CREAM | Freq: Three times a day (TID) | CUTANEOUS | Status: DC

## 2014-04-23 NOTE — Assessment & Plan Note (Signed)
R thumb Dr Tamala Julian cons

## 2014-04-23 NOTE — Telephone Encounter (Signed)
Pt came in today for an ov and was told to go to the lab to do nasal scrape. Pt waited for 20 min and the lab told her that they do not do it down there. Please advise.

## 2014-04-23 NOTE — Assessment & Plan Note (Signed)
1/16 contact dermatitis R chest Triamc cream tid

## 2014-04-23 NOTE — Progress Notes (Signed)
Subjective:     HPI  C/o rash on chest C/o R thumb pain and triggering x 2 wks F/u a boil on upper abdomen x 4 d - painful  She took Lamisil po for toenail fungus (Dr Magnus Ivan) and did not take Crestor. Now Lachelle has stopped Lamisil and re-started Crestor. C/o wt gain  Retired 8/15, new grand-dtr 03/20/14  Wt Readings from Last 3 Encounters:  04/23/14 179 lb (81.194 kg)  04/04/14 179 lb (81.194 kg)  02/10/14 177 lb (80.287 kg)   BP Readings from Last 3 Encounters:  04/23/14 140/92  04/09/14 146/98  04/04/14 116/80     Review of Systems  Constitutional: Negative.  Negative for chills, diaphoresis, activity change, appetite change, fatigue and unexpected weight change.  HENT: Negative for congestion, ear pain, facial swelling, hearing loss, mouth sores, nosebleeds, postnasal drip, rhinorrhea, sinus pressure, sneezing, sore throat, tinnitus and trouble swallowing.   Eyes: Negative for pain, discharge, redness, itching and visual disturbance.  Respiratory: Negative for cough, chest tightness, shortness of breath, wheezing and stridor.   Cardiovascular: Negative for palpitations and leg swelling.  Gastrointestinal: Negative for nausea, diarrhea, constipation, blood in stool, abdominal distention, anal bleeding and rectal pain.  Genitourinary: Negative for urgency, frequency, hematuria, flank pain, vaginal bleeding, vaginal discharge, difficulty urinating, genital sores and vaginal pain.  Musculoskeletal: Negative for joint swelling, arthralgias, gait problem, neck pain and neck stiffness.  Skin: Negative.  Negative for rash.  Neurological: Negative for dizziness, tremors, seizures, syncope and speech difficulty.  Hematological: Negative for adenopathy. Does not bruise/bleed easily.  Psychiatric/Behavioral: Negative for suicidal ideas, behavioral problems, sleep disturbance, dysphoric mood and decreased concentration. The patient is not nervous/anxious.        Objective:   Physical Exam  Constitutional: She appears well-developed. No distress.  HENT:  Head: Normocephalic.  Right Ear: External ear normal.  Left Ear: External ear normal.  Nose: Nose normal.  Mouth/Throat: Oropharynx is clear and moist.  Eyes: Conjunctivae are normal. Pupils are equal, round, and reactive to light. Right eye exhibits no discharge. Left eye exhibits no discharge.  Neck: Normal range of motion. Neck supple. No JVD present. No tracheal deviation present. No thyromegaly present.  Cardiovascular: Normal rate, regular rhythm and normal heart sounds.   Pulmonary/Chest: No stridor. No respiratory distress. She has no wheezes.  Abdominal: Soft. Bowel sounds are normal. She exhibits no distension and no mass. There is no tenderness. There is no rebound and no guarding.  Musculoskeletal: She exhibits no edema or tenderness.  Lymphadenopathy:    She has no cervical adenopathy.  Neurological: She displays normal reflexes. No cranial nerve deficit. She exhibits normal muscle tone. Coordination normal.  Skin: No rash noted. No erythema.  Psychiatric: She has a normal mood and affect. Her behavior is normal. Judgment and thought content normal.  rash on RUQ skin, NT R thumb tender, triggering  Lab Results  Component Value Date   WBC 9.4 04/04/2014   HGB 13.9 04/04/2014   HCT 41.7 04/04/2014   PLT 307.0 04/04/2014   GLUCOSE 92 04/04/2014   CHOL 327* 04/04/2014   TRIG 131.0 04/04/2014   HDL 38.10* 04/04/2014   LDLDIRECT 153.7 01/22/2013   LDLCALC 263* 04/04/2014   ALT 24 04/04/2014   AST 25 04/04/2014   NA 139 04/04/2014   K 4.2 04/04/2014   CL 109 04/04/2014   CREATININE 0.9 04/04/2014   BUN 16 04/04/2014   CO2 24 04/04/2014   TSH 4.81* 04/04/2014   HGBA1C 5.7 01/22/2013  Assessment & Plan:  Patient ID: AUDREANA HANCOX, female   DOB: 1960-09-16, 54 y.o.   MRN: [id number]

## 2014-04-23 NOTE — Assessment & Plan Note (Signed)
Patient was given an injection today and did give patient a brace today. We discussed home exercises and icing protocol. Patient will come back in 2-3 weeks for further evaluation and treatment. I anticipate patient doing very well.

## 2014-04-23 NOTE — Progress Notes (Signed)
Pre visit review using our clinic review tool, if applicable. No additional management support is needed unless otherwise documented below in the visit note. 

## 2014-04-23 NOTE — Progress Notes (Signed)
[name] [place], [place] [address] Phone: [phone] Subjective:    I'm seeing this patient by the request  of:  Walker Kehr, MD   CC: Right thumb pain  XTG:GYIRSWNIOE Vanessa [name] is a 54 y.o. female coming in with complaint of right thumb pain. Patient has had this pain intermittently for multiple years but recently over the course last 3 weeks it has started to increase severely. Patient states that now it is getting locked. Patient states in the morning she has to use her other hand to straighten her thumb. Patient states that he can be very sore. Denies any numbness or tingling. States that it feels like she is losing strength. Patient has had x-rays by primary care provider previously. These x-rays were reviewed by me and showed some mild CMC arthritis but otherwise fairly unremarkable. Patient states it does not wake her up at night but is making some activities of daily living challenging.      Past medical history, social, surgical and family history all reviewed in electronic medical record.   Review of Systems: No headache, visual changes, nausea, vomiting, diarrhea, constipation, dizziness, abdominal pain, skin rash, fevers, chills, night sweats, weight loss, swollen lymph nodes, body aches, joint swelling, muscle aches, chest pain, shortness of breath, mood changes.   Objective Blood pressure 140/92, pulse 67, weight 179 lb (81.194 kg), last menstrual period 03/08/2006, SpO2 95 %.  General: No apparent distress alert and oriented x3 mood and affect normal, dressed appropriately.  HEENT: Pupils equal, extraocular movements intact  Respiratory: Patient's speak in full sentences and does not appear short of breath  Cardiovascular: No lower extremity edema, non tender, no erythema  Skin: Warm dry intact with no signs of infection or rash on extremities or on axial skeleton.  Abdomen: Soft nontender  Neuro: Cranial nerves II  through XII are intact, neurovascularly intact in all extremities with 2+ DTRs and 2+ pulses.  Lymph: No lymphadenopathy of posterior or anterior cervical chain or axillae bilaterally.  Gait normal with good balance and coordination.  MSK:  Non tender with full range of motion and good stability and symmetric strength and tone of shoulders, elbows, wrist, hip, knee and ankles bilaterally.  Hand exam shows patient is having triggering of the right thumb at the IP joint as well as the [facility] joint. Nodules that are palpated and tender to palpation noted. Patient has mild positive grind test of the thumb. Her vascular intact distally with good capillary refill. Good grip strength. Kocher lateral and unremarkable  MSK US performed of: Right This study was ordered, performed, and interpreted by Charlann Boxer D.O.  Wrist: All extensor compartments visualized and tendons all normal in appearance without fraying, tears, or sheath effusions. No effusion seen. TFCC intact. Scapholunate ligament intact. Carpal tunnel visualized and median nerve area normal, flexor tendons all normal in appearance without fraying, tears, or sheath effusions. Power doppler signal normal. Patient's right trigger thumb at the A1 and A2 pulley has separate nodules.  IMPRESSION:  Right trigger thumb 2 positions  Procedure: Real-time Ultrasound Guided Injection of 2 trigger points on right thumb Device: GE Logiq E  Ultrasound guided injection is preferred based studies that show increased duration, increased effect, greater accuracy, decreased procedural pain, increased response rate, and decreased cost with ultrasound guided versus blind injection.  Verbal informed consent obtained.  Time-out conducted.  Noted no overlying erythema, induration, or other signs of local infection.  Skin prepped  in a sterile fashion.  Local anesthesia: Topical Ethyl chloride.  With sterile technique and under real time ultrasound guidance: With a  25-gauge half-inch needle patient had 2 separate injections in the A1 and A2 pulley nodules that were present in the right thumb tendon flexor. Patient had a total of 1 mL of Marcaine 0.5% and 1 mL of Kenalog 40 mg/dL  Completed without difficulty  Pain immediately resolved suggesting accurate placement of the medication.  Advised to call if fevers/chills, erythema, induration, drainage, or persistent bleeding.  Images permanently stored and available for review in the ultrasound unit.  Impression: Technically successful ultrasound guided injection.     Impression and Recommendations:     This case required medical decision making of moderate complexity.

## 2014-04-23 NOTE — Patient Instructions (Signed)
Nice to meet you Ice 20 minutes 2 times daily. Usually after activity and before bed. Exercises 3 times a week.  Wear brace day and night for 1 week  But then nightly for 1-2 weeks.  See me again in 2-3 weeks to make sure you are better.

## 2014-04-24 NOTE — Telephone Encounter (Signed)
Vanessa [name], please, see how we do a nasal swab for MRSA here Thx

## 2014-04-25 NOTE — Telephone Encounter (Signed)
Pt informed to come back to clinic and see me. I will swab her.

## 2014-05-14 ENCOUNTER — Ambulatory Visit: Payer: BC Managed Care – PPO | Admitting: Family Medicine

## 2014-05-21 ENCOUNTER — Encounter: Payer: Self-pay | Admitting: Internal Medicine

## 2014-05-21 ENCOUNTER — Ambulatory Visit: Payer: BC Managed Care – PPO | Admitting: Internal Medicine

## 2014-07-23 ENCOUNTER — Ambulatory Visit: Payer: BC Managed Care – PPO | Admitting: Internal Medicine

## 2014-09-26 ENCOUNTER — Ambulatory Visit: Payer: BC Managed Care – PPO | Admitting: Internal Medicine

## 2014-09-26 ENCOUNTER — Encounter: Payer: Self-pay | Admitting: Internal Medicine

## 2014-09-26 ENCOUNTER — Telehealth: Payer: BC Managed Care – PPO | Admitting: Nurse Practitioner

## 2014-09-26 DIAGNOSIS — J329 Chronic sinusitis, unspecified: Secondary | ICD-10-CM

## 2014-09-26 MED ORDER — FLUTICASONE PROPIONATE 50 MCG/ACT NA SUSP: 2.0000 | Freq: Every day | NASAL | Status: DC

## 2014-09-26 NOTE — Progress Notes (Signed)
We are sorry that you are not feeling well.  Here is how we plan to help!  Based on what you have shared with me it looks like you have sinusitis.  Sinusitis is inflammation and infection in the sinus cavities of the head.  Based on your presentation I believe you most likely have Acute Viral Sinusitis. This is an infection most likely caused by a virus.  There is not specific treatment for viral sinusitis other than to help you with the symptoms until the infection runs it's course.  You may use an oral decongestant such as Mucinex D or if you have glaucoma or high blood pressure use plain Mucinex.  Saline nasal spray help and can safely be used as often as needed for congestion, I have prescribed fluticason nasal spray. Spray two sprays in each nostril twice a day to help reduce your symptoms.  Some authorities believe that zinc sprays or the use of Echinacea may shorten the course of your symptoms.  Sinus infections are not as easily transmitted as other respiratory infection, however we still recommend that you avoid close contact with loved ones, especially the very young and elderly.  Remember to wash your hands thoroughly throughout the day as this is the number one way to prevent the spread of infection!  Home Care:  Only take medications as instructed by your medical team.  Complete the entire course of an antibiotic.  Do not take these medications with alcohol.  A steam or ultrasonic humidifier can help congestion.  You can place a towel over your head and breathe in the steam from hot water coming from a faucet.  Avoid close contacts especially the very young and the elderly.  Cover your mouth when you cough or sneeze.  Always remember to wash your hands.  Get Help Right Away If:  You develop worsening fever or sinus pain.  You develop a severe head ache or visual changes.  Your symptoms persist after you have completed your treatment plan.  Make sure you  Understand these  instructions.  Will watch your condition.  Will get help right away if you are not doing well or get worse.  Your e-visit answers were reviewed by a board certified advanced clinical practitioner to complete your personal care plan.  Depending on the condition, your plan could have included both over the counter or prescription medications.  If there is a problem please reply  once you have received a response from your provider.  Your safety is important to [place].  If you have drug allergies check your prescription carefully.    You can use MyChart to ask questions about today's visit, request a non-urgent call back, or ask for a work or school excuse.  You will get an e-mail in the next two days asking about your experience.  I hope that your e-visit has been valuable and will speed your recovery. Thank you for using e-visits.

## 2014-09-27 ENCOUNTER — Ambulatory Visit: Payer: BC Managed Care – PPO | Admitting: Family Medicine

## 2014-09-30 ENCOUNTER — Telehealth: Payer: Self-pay | Admitting: Internal Medicine

## 2014-09-30 NOTE — Telephone Encounter (Signed)
Patient was scheduled Saturday morning, but she ended up doing an e-visit Friday. She tried to cancel Saturdays appointment through [facility] and by calling in and was unsuccessful. Can we make sure she isn't charged a no show fee. Thanks

## 2014-10-16 ENCOUNTER — Telehealth: Payer: Self-pay | Admitting: Internal Medicine

## 2014-10-16 MED ORDER — ALPRAZOLAM 0.25 MG PO TABS: 0.2500 mg | ORAL_TABLET | Freq: Two times a day (BID) | ORAL | Status: DC | PRN

## 2014-10-16 NOTE — Telephone Encounter (Signed)
OK. Thx

## 2014-10-16 NOTE — Telephone Encounter (Signed)
Patient requesting refill for ALPRAZolam (XANAX) 0.25 MG tablet [[id number]

## 2014-11-05 ENCOUNTER — Ambulatory Visit: Payer: BC Managed Care – PPO | Admitting: Family Medicine

## 2014-11-20 ENCOUNTER — Encounter: Payer: Self-pay | Admitting: Family Medicine

## 2014-11-20 ENCOUNTER — Other Ambulatory Visit (INDEPENDENT_AMBULATORY_CARE_PROVIDER_SITE_OTHER): Payer: BC Managed Care – PPO

## 2014-11-20 ENCOUNTER — Ambulatory Visit (INDEPENDENT_AMBULATORY_CARE_PROVIDER_SITE_OTHER): Payer: BC Managed Care – PPO | Admitting: Family Medicine

## 2014-11-20 VITALS — BP 128/84 | HR 76 | Wt 181.0 lb

## 2014-11-20 DIAGNOSIS — M79645 Pain in left finger(s): Secondary | ICD-10-CM

## 2014-11-20 DIAGNOSIS — E538 Deficiency of other specified B group vitamins: Secondary | ICD-10-CM

## 2014-11-20 DIAGNOSIS — M65312 Trigger thumb, left thumb: Secondary | ICD-10-CM | POA: Diagnosis not present

## 2014-11-20 MED ORDER — CYANOCOBALAMIN 1000 MCG/ML IJ SOLN
1000.0000 ug | Freq: Once | INTRAMUSCULAR | Status: AC
  Administered 2014-11-20: 1000 ug via INTRAMUSCULAR

## 2014-11-20 NOTE — Progress Notes (Signed)
Pre visit review using our clinic review tool, if applicable. No additional management support is needed unless otherwise documented below in the visit note. 

## 2014-11-20 NOTE — Progress Notes (Signed)
[name] [place], [place] [address] Phone: [phone] Subjective:    CC: Left thumb pain  EGB:[id number] Vanessa [name] is a 54 y.o. female coming in with complaint of left thumb pain. Patient states that this is very similar to that presentation of her right thumb. Patient had an injection greater than 8 months ago on that side. Patient states that once completely resolved. Patient states that this will has tightness and when she flexes her thumb gets stuck in a flexed position. States that there is a spot that is severely tender. Denies any injury. Has been gardening a lot more.     Past medical history, social, surgical and family history all reviewed in electronic medical record.   Review of Systems: No headache, visual changes, nausea, vomiting, diarrhea, constipation, dizziness, abdominal pain, skin rash, fevers, chills, night sweats, weight loss, swollen lymph nodes, body aches, joint swelling, muscle aches, chest pain, shortness of breath, mood changes.   Objective Blood pressure 128/84, pulse 76, weight 181 lb (82.101 kg), last menstrual period 03/08/2006, SpO2 97 %.  General: No apparent distress alert and oriented x3 mood and affect normal, dressed appropriately.  HEENT: Pupils equal, extraocular movements intact  Respiratory: Patient's speak in full sentences and does not appear short of breath  Cardiovascular: No lower extremity edema, non tender, no erythema  Skin: Warm dry intact with no signs of infection or rash on extremities or on axial skeleton.  Abdomen: Soft nontender  Neuro: Cranial nerves II through XII are intact, neurovascularly intact in all extremities with 2+ DTRs and 2+ pulses.  Lymph: No lymphadenopathy of posterior or anterior cervical chain or axillae bilaterally.  Gait normal with good balance and coordination.  MSK:  Non tender with full range of motion and good stability and symmetric strength and tone of  shoulders, elbows, wrist, hip, knee and ankles bilaterally.  Hand exam shows patient is having triggering of the left thumb at the IP joint as well as the [facility] joint. Nodules that are palpated and tender to palpation noted. Negative grind test Her vascular intact distally with good capillary refill. Good grip strength. Contralateral thumb unremarkable  MSK US performed of: Left This study was ordered, performed, and interpreted by Charlann Boxer D.O.  Wrist: All extensor compartments visualized and tendons all normal in appearance without fraying, tears, or sheath effusions. No effusion seen. TFCC intact. Scapholunate ligament intact. Carpal tunnel visualized and median nerve area normal, flexor tendons all normal in appearance without fraying, tears, or sheath effusions. Power doppler signal normal. Nodule noted in the flexor tendon sheath with significant hypoechoic changes  IMPRESSION:  Left trigger thumb  Procedure: Real-time Ultrasound Guided Injection of 1 trigger points on left flexor tendon sheath thumb Device: GE Logiq E  Ultrasound guided injection is preferred based studies that show increased duration, increased effect, greater accuracy, decreased procedural pain, increased response rate, and decreased cost with ultrasound guided versus blind injection.  Verbal informed consent obtained.  Time-out conducted.  Noted no overlying erythema, induration, or other signs of local infection.  Skin prepped in a sterile fashion.  Local anesthesia: Topical Ethyl chloride.  With sterile technique and under real time ultrasound guidance: With a 25-gauge half-inch needle patient had  A2 pulley nodules that were present in the right thumb tendon flexor. Patient had a total of . 0.5 mL of Marcaine 0.5% and 0.5 mL of Kenalog 40 mg/dL  Completed without difficulty  Pain immediately resolved suggesting  accurate placement of the medication.  Advised to call if fevers/chills, erythema, induration,  drainage, or persistent bleeding.  Images permanently stored and available for review in the ultrasound unit.  Impression: Technically successful ultrasound guided injection.     Impression and Recommendations:     This case required medical decision making of moderate complexity.

## 2014-11-20 NOTE — Addendum Note (Signed)
Addended by: Douglass Rivers T on: 11/20/2014 03:18 PM   Modules accepted: Orders

## 2014-11-20 NOTE — Patient Instructions (Addendum)
Good to see you again.  Ice 20 minutes 2 times daily. Usually after activity and before bed. Wear brace day and night for 1 week then nightly for 2 weeks pennsaid pinkie amount topically 2 times daily as needed.  Tylenol 325mg  3 times a day Wear gloves at night See me in 3 weeks if not perfect

## 2014-11-20 NOTE — Assessment & Plan Note (Signed)
Patient given an injection today. Patient tolerated the procedure very well. Patient given a brace to wear day and night for one week and then nightly for 2 weeks. Discussed avoiding any significant repetitive activity at this time. Trial of topical anti-inflammatory skin. Icing regimen given. Discussed over-the-counter medications a could be beneficial. Patient will follow-up in 3 weeks for further evaluation and treatment.

## 2014-12-11 ENCOUNTER — Ambulatory Visit: Payer: BC Managed Care – PPO | Admitting: Family

## 2014-12-25 LAB — HM MAMMOGRAPHY

## 2014-12-26 ENCOUNTER — Encounter: Payer: Self-pay | Admitting: Gynecology

## 2014-12-31 ENCOUNTER — Encounter: Payer: Self-pay | Admitting: Internal Medicine

## 2015-01-14 ENCOUNTER — Ambulatory Visit (INDEPENDENT_AMBULATORY_CARE_PROVIDER_SITE_OTHER): Payer: BC Managed Care – PPO

## 2015-01-14 DIAGNOSIS — E538 Deficiency of other specified B group vitamins: Secondary | ICD-10-CM

## 2015-01-14 MED ORDER — CYANOCOBALAMIN 1000 MCG/ML IJ SOLN
1000.0000 ug | Freq: Once | INTRAMUSCULAR | Status: AC
  Administered 2015-01-14: 1000 ug via INTRAMUSCULAR

## 2015-04-02 ENCOUNTER — Other Ambulatory Visit: Payer: Self-pay | Admitting: Internal Medicine

## 2015-06-03 ENCOUNTER — Other Ambulatory Visit: Payer: Self-pay | Admitting: Internal Medicine

## 2015-06-03 NOTE — Telephone Encounter (Signed)
Ok to Rf in PCP's absence? Thanks! 

## 2015-06-03 NOTE — Telephone Encounter (Signed)
Alprazolam Rx printed/signed/faxed to pharmacy.

## 2015-06-24 ENCOUNTER — Encounter: Payer: Self-pay | Admitting: Nurse Practitioner

## 2015-06-24 ENCOUNTER — Ambulatory Visit (INDEPENDENT_AMBULATORY_CARE_PROVIDER_SITE_OTHER): Payer: BC Managed Care – PPO | Admitting: Nurse Practitioner

## 2015-06-24 VITALS — BP 132/98 | HR 102 | Temp 97.9°F | Ht 62.0 in | Wt 178.0 lb

## 2015-06-24 DIAGNOSIS — R05 Cough: Secondary | ICD-10-CM

## 2015-06-24 DIAGNOSIS — R059 Cough, unspecified: Secondary | ICD-10-CM

## 2015-06-24 MED ORDER — AZITHROMYCIN 250 MG PO TABS: ORAL_TABLET | ORAL | Status: DC

## 2015-06-24 MED ORDER — METHYLPREDNISOLONE ACETATE 80 MG/ML IJ SUSP
80.0000 mg | Freq: Once | INTRAMUSCULAR | Status: AC
  Administered 2015-06-24: 80 mg via INTRAMUSCULAR

## 2015-06-24 NOTE — Patient Instructions (Signed)

## 2015-06-24 NOTE — Progress Notes (Signed)
Patient ID: Vanessa [name], female    DOB: 15-Apr-1960  Age: 55 y.o. MRN: [id number]  CC: OTHER   HPI Vanessa [name] presents for CC of sinusitis x 1 week.   1) Nasal congestion, HA, tooth pain Decreased smell and taste  Coughing Subjective fever, ST, ears popping  Sick contacts both sons had recent sinus infections diagnosed by an urgent care.   Treatment to date:  Zyrtec, Mucinex, Saline nasal spray  Warm salt water garggle   History Vanessa [name] has a past medical history of Hyperlipidemia; Cyst; Tobacco abuse; Allergy; Hypothyroidism; Anxiety; Depression; and B12 deficiency.   She has past surgical history that includes Cesarean section (1992) and Ganglion cyst excision (2009).   Her family history includes Coronary artery disease in her other; Heart disease (age of onset: 68) in her father; Hyperlipidemia in her other; Other in her mother; Thyroid disease in her mother. There is no history of Colon cancer.She reports that she has been smoking Cigarettes.  She has been smoking about 0.25 packs per day. She has never used smokeless tobacco. She reports that she does not drink alcohol or use illicit drugs.  Outpatient Prescriptions Prior to Visit  Medication Sig Dispense Refill  . ALPRAZolam (XANAX) 0.25 MG tablet TAKE ONE TABLET BY MOUTH TWICE DAILY AS NEEDED FOR ANXIETY 60 tablet 0  . aspirin 81 MG tablet Take 81 mg by mouth daily.      Marland Kitchen ibuprofen (ADVIL,MOTRIN) 600 MG tablet Take 1 tablet (600 mg total) by mouth every 8 (eight) hours as needed for pain. 60 tablet 0  . levothyroxine (SYNTHROID, LEVOTHROID) 88 MCG tablet TAKE ONE TABLET BY MOUTH ONCE DAILY 30 tablet 0  . rosuvastatin (CRESTOR) 20 MG tablet Take 1 tablet (20 mg total) by mouth daily. 30 tablet 11  . Emollient [facility] EX) Apply topically daily. Reported on 06/24/2015    . ciclopirox (PENLAC) 8 % solution Apply topically at bedtime. Apply over nail and surrounding skin. Apply daily over previous coat. After seven (7)  days, may remove with alcohol and continue cycle. (Patient not taking: Reported on 06/24/2015) 6.6 mL 0   No facility-administered medications prior to visit.    ROS Review of Systems  Constitutional: Positive for fever and fatigue. Negative for chills and diaphoresis.  HENT: Positive for congestion, sinus pressure, sneezing and sore throat. Negative for postnasal drip and rhinorrhea.   Respiratory: Positive for cough. Negative for chest tightness, shortness of breath and wheezing.   Cardiovascular: Negative for chest pain, palpitations and leg swelling.  Gastrointestinal: Negative for nausea, vomiting and diarrhea.  Skin: Negative for rash.  Neurological: Positive for headaches. Negative for dizziness.    Objective:  BP 132/98 mmHg  Pulse 102  Temp(Src) 97.9 F (36.6 C) (Oral)  Ht 5\' 2"  (1.575 m)  Wt 178 lb (80.74 kg)  BMI 32.55 kg/m2  SpO2 98%  LMP 03/08/2006  Physical Exam  Constitutional: She is oriented to person, place, and time. She appears well-developed and well-nourished. No distress.  HENT:  Head: Normocephalic and atraumatic.  Right Ear: External ear normal.  Left Ear: External ear normal.  Mouth/Throat: Oropharynx is clear and moist. No oropharyngeal exudate.  TMs clear left  TM on right injected  Eyes: EOM are normal. Pupils are equal, round, and reactive to light. Right eye exhibits no discharge. Left eye exhibits no discharge. No scleral icterus.  Neck: Normal range of motion. Neck supple.  Cardiovascular: Normal rate and regular rhythm.   Pulmonary/Chest: Effort normal. No  respiratory distress. She has no wheezes. She has no rales. She exhibits no tenderness.  Rhonchi scattered  Lymphadenopathy:    She has no cervical adenopathy.  Neurological: She is alert and oriented to person, place, and time.  Skin: Skin is warm and dry. No rash noted. She is not diaphoretic.  Psychiatric: She has a normal mood and affect. Her behavior is normal. Judgment and thought  content normal.   Assessment & Plan:   Mahrosh was seen today for other and other.  Diagnoses and all orders for this visit:  Cough -     methylPREDNISolone acetate (DEPO-MEDROL) injection 80 mg; Inject 1 mL (80 mg total) into the muscle once.  Other orders -     azithromycin (ZITHROMAX) 250 MG tablet; Take 2 tablets by mouth on day 1, take 1 tablet by mouth each day after for 4 days.   I have discontinued Ms. Dulude's ciclopirox. I am also having her start on azithromycin. Additionally, I am having her maintain her aspirin, ibuprofen, Emollient (KERADAN EX), rosuvastatin, levothyroxine, and ALPRAZolam. We administered methylPREDNISolone acetate.  Meds ordered this encounter  Medications  . azithromycin (ZITHROMAX) 250 MG tablet    Sig: Take 2 tablets by mouth on day 1, take 1 tablet by mouth each day after for 4 days.    Dispense:  6 each    Refill:  0    Order Specific Question:  Supervising Provider    Answer:  Deborra Medina L [[id number]]  . methylPREDNISolone acetate (DEPO-MEDROL) injection 80 mg    Sig:      Follow-up: Return if symptoms worsen or fail to improve.

## 2015-06-24 NOTE — Progress Notes (Signed)
Pre visit review using our clinic review tool, if applicable. No additional management support is needed unless otherwise documented below in the visit note. 

## 2015-06-28 DIAGNOSIS — R059 Cough, unspecified: Secondary | ICD-10-CM | POA: Insufficient documentation

## 2015-06-28 DIAGNOSIS — R05 Cough: Secondary | ICD-10-CM | POA: Insufficient documentation

## 2015-06-28 NOTE — Assessment & Plan Note (Signed)
New onset Depo-Medrol 80 mg IM  Z-pack sent to pharmacy FU prn worsening/failure to improve.

## 2015-06-30 ENCOUNTER — Encounter: Payer: BC Managed Care – PPO | Admitting: Internal Medicine

## 2015-07-21 ENCOUNTER — Encounter: Payer: Self-pay | Admitting: Internal Medicine

## 2015-07-21 ENCOUNTER — Ambulatory Visit (INDEPENDENT_AMBULATORY_CARE_PROVIDER_SITE_OTHER): Payer: BC Managed Care – PPO | Admitting: Internal Medicine

## 2015-07-21 ENCOUNTER — Other Ambulatory Visit (INDEPENDENT_AMBULATORY_CARE_PROVIDER_SITE_OTHER): Payer: BC Managed Care – PPO

## 2015-07-21 ENCOUNTER — Encounter: Payer: BC Managed Care – PPO | Admitting: Internal Medicine

## 2015-07-21 VITALS — BP 142/80 | HR 75 | Ht 62.0 in | Wt 179.5 lb

## 2015-07-21 DIAGNOSIS — Z720 Tobacco use: Secondary | ICD-10-CM

## 2015-07-21 DIAGNOSIS — Z0000 Encounter for general adult medical examination without abnormal findings: Secondary | ICD-10-CM | POA: Diagnosis not present

## 2015-07-21 DIAGNOSIS — I10 Essential (primary) hypertension: Secondary | ICD-10-CM | POA: Diagnosis not present

## 2015-07-21 DIAGNOSIS — E559 Vitamin D deficiency, unspecified: Secondary | ICD-10-CM

## 2015-07-21 DIAGNOSIS — E538 Deficiency of other specified B group vitamins: Secondary | ICD-10-CM

## 2015-07-21 DIAGNOSIS — Z23 Encounter for immunization: Secondary | ICD-10-CM

## 2015-07-21 DIAGNOSIS — R7309 Other abnormal glucose: Secondary | ICD-10-CM

## 2015-07-21 DIAGNOSIS — E038 Other specified hypothyroidism: Secondary | ICD-10-CM

## 2015-07-21 DIAGNOSIS — E785 Hyperlipidemia, unspecified: Secondary | ICD-10-CM

## 2015-07-21 DIAGNOSIS — F17200 Nicotine dependence, unspecified, uncomplicated: Secondary | ICD-10-CM

## 2015-07-21 DIAGNOSIS — I129 Hypertensive chronic kidney disease with stage 1 through stage 4 chronic kidney disease, or unspecified chronic kidney disease: Secondary | ICD-10-CM | POA: Insufficient documentation

## 2015-07-21 DIAGNOSIS — E034 Atrophy of thyroid (acquired): Secondary | ICD-10-CM

## 2015-07-21 LAB — LIPID PANEL
Cholesterol: 314 mg/dL — ABNORMAL HIGH (ref 0–200)
HDL: 37.4 mg/dL — ABNORMAL LOW (ref >39.00)
LDL Cholesterol: 248 mg/dL — ABNORMAL HIGH (ref 0–99)
NONHDL: 276.54
Total CHOL/HDL Ratio: 8
Triglycerides: 144 mg/dL (ref 0.0–149.0)
VLDL: 28.8 mg/dL (ref 0.0–40.0)

## 2015-07-21 LAB — TSH: TSH: 1.37 u[IU]/mL (ref 0.35–4.50)

## 2015-07-21 LAB — BASIC METABOLIC PANEL
BUN: 19 mg/dL (ref 6–23)
CHLORIDE: 104 meq/L (ref 96–112)
CO2: 29 meq/L (ref 19–32)
Calcium: 9.5 mg/dL (ref 8.4–10.5)
Creatinine, Ser: 0.86 mg/dL (ref 0.40–1.20)
GFR: 88.04 mL/min (ref >60.00)
Glucose, Bld: 89 mg/dL (ref 70–99)
POTASSIUM: 4.1 meq/L (ref 3.5–5.1)
Sodium: 138 mEq/L (ref 135–145)

## 2015-07-21 LAB — URINALYSIS
BILIRUBIN URINE: NEGATIVE
Ketones, ur: NEGATIVE
Leukocytes, UA: NEGATIVE
Nitrite: NEGATIVE
Specific Gravity, Urine: 1.015 (ref 1.000–1.030)
TOTAL PROTEIN, URINE-UPE24: NEGATIVE
URINE GLUCOSE: NEGATIVE
Urobilinogen, UA: 0.2 (ref 0.0–1.0)
pH: 6 (ref 5.0–8.0)

## 2015-07-21 LAB — HEPATIC FUNCTION PANEL
ALK PHOS: 58 U/L (ref 39–117)
ALT: 14 U/L (ref 0–35)
AST: 15 U/L (ref 0–37)
Albumin: 4.3 g/dL (ref 3.5–5.2)
Bilirubin, Direct: 0.1 mg/dL (ref 0.0–0.3)
Total Bilirubin: 0.5 mg/dL (ref 0.2–1.2)
Total Protein: 8.1 g/dL (ref 6.0–8.3)

## 2015-07-21 LAB — CBC WITH DIFFERENTIAL/PLATELET
BASOS PCT: 0.5 % (ref 0.0–3.0)
Basophils Absolute: 0.1 10*3/uL (ref 0.0–0.1)
EOS PCT: 3.6 % (ref 0.0–5.0)
Eosinophils Absolute: 0.4 10*3/uL (ref 0.0–0.7)
HCT: 42.1 % (ref 36.0–46.0)
Hemoglobin: 14.4 g/dL (ref 12.0–15.0)
Lymphocytes Relative: 25.1 % (ref 12.0–46.0)
Lymphs Abs: 2.9 10*3/uL (ref 0.7–4.0)
MCHC: 34.2 g/dL (ref 30.0–36.0)
MCV: 86.8 fl (ref 78.0–100.0)
MONO ABS: 0.6 10*3/uL (ref 0.1–1.0)
Monocytes Relative: 5.3 % (ref 3.0–12.0)
Neutro Abs: 7.6 10*3/uL (ref 1.4–7.7)
Neutrophils Relative %: 65.5 % (ref 43.0–77.0)
Platelets: 330 10*3/uL (ref 150.0–400.0)
RBC: 4.85 Mil/uL (ref 3.87–5.11)
RDW: 14 % (ref 11.5–15.5)
WBC: 11.6 10*3/uL — ABNORMAL HIGH (ref 4.0–10.5)

## 2015-07-21 LAB — HEPATITIS C ANTIBODY: HCV AB: NEGATIVE

## 2015-07-21 LAB — VITAMIN D 25 HYDROXY (VIT D DEFICIENCY, FRACTURES): VITD: 9.7 ng/mL — AB (ref 30.00–100.00)

## 2015-07-21 LAB — HEMOGLOBIN A1C: HEMOGLOBIN A1C: 5.7 % (ref 4.6–6.5)

## 2015-07-21 LAB — VITAMIN B12: Vitamin B-12: 491 pg/mL (ref 211–911)

## 2015-07-21 MED ORDER — CYANOCOBALAMIN 1000 MCG/ML IJ SOLN
1000.0000 ug | Freq: Once | INTRAMUSCULAR | Status: AC
  Administered 2015-07-21: 1000 ug via INTRAMUSCULAR

## 2015-07-21 MED ORDER — ALPRAZOLAM 0.25 MG PO TABS: 0.2500 mg | ORAL_TABLET | Freq: Two times a day (BID) | ORAL | Status: DC | PRN

## 2015-07-21 MED ORDER — TRIAMTERENE-HCTZ 37.5-25 MG PO TABS: 1.0000 | ORAL_TABLET | Freq: Every day | ORAL | Status: DC

## 2015-07-21 MED ORDER — LEVOTHYROXINE SODIUM 88 MCG PO TABS: 88.0000 ug | ORAL_TABLET | Freq: Every day | ORAL | Status: DC

## 2015-07-21 MED ORDER — ERGOCALCIFEROL 1.25 MG (50000 UT) PO CAPS: 50000.0000 [IU] | ORAL_CAPSULE | ORAL | Status: DC

## 2015-07-21 NOTE — Assessment & Plan Note (Signed)
Maxzide NAS

## 2015-07-21 NOTE — Assessment & Plan Note (Signed)
On B12 

## 2015-07-21 NOTE — Assessment & Plan Note (Signed)
New Start Vit D prescription 50000 iu weekly (Rx emailed to your pharmacy) followed by over-the-counter Vit D 1000 iu daily.

## 2015-07-21 NOTE — Progress Notes (Signed)
Subjective:  Patient ID: Vanessa [name], female    DOB: May 11, 1960  Age: 55 y.o. MRN: [id number]  CC: No chief complaint on file.   HPI SA FEHRMAN presents for a well exam. F/u B12 def, hypothyroidism, dyslipidemia.  Outpatient Prescriptions Prior to Visit  Medication Sig Dispense Refill  . ALPRAZolam (XANAX) 0.25 MG tablet TAKE ONE TABLET BY MOUTH TWICE DAILY AS NEEDED FOR ANXIETY 60 tablet 0  . aspirin 81 MG tablet Take 81 mg by mouth daily.      Marland Kitchen ibuprofen (ADVIL,MOTRIN) 600 MG tablet Take 1 tablet (600 mg total) by mouth every 8 (eight) hours as needed for pain. 60 tablet 0  . levothyroxine (SYNTHROID, LEVOTHROID) 88 MCG tablet TAKE ONE TABLET BY MOUTH ONCE DAILY 30 tablet 0  . azithromycin (ZITHROMAX) 250 MG tablet Take 2 tablets by mouth on day 1, take 1 tablet by mouth each day after for 4 days. (Patient not taking: Reported on 07/21/2015) 6 each 0  . Emollient (KERADAN EX) Apply topically daily. Reported on 07/21/2015    . rosuvastatin (CRESTOR) 20 MG tablet Take 1 tablet (20 mg total) by mouth daily. (Patient not taking: Reported on 07/21/2015) 30 tablet 11   No facility-administered medications prior to visit.    ROS Review of Systems  Constitutional: Negative for chills, activity change, appetite change, fatigue and unexpected weight change.  HENT: Negative for congestion, mouth sores and sinus pressure.   Eyes: Negative for visual disturbance.  Respiratory: Negative for cough and chest tightness.   Gastrointestinal: Negative for nausea and abdominal pain.  Genitourinary: Negative for frequency, difficulty urinating and vaginal pain.  Musculoskeletal: Negative for back pain and gait problem.  Skin: Negative for pallor and rash.  Neurological: Negative for dizziness, tremors, weakness, numbness and headaches.  Psychiatric/Behavioral: Negative for suicidal ideas, confusion and sleep disturbance.    Objective:  BP 142/80 mmHg  Pulse 75  Ht 5\' 2"  (1.575 m)  Wt  179 lb 8 oz (81.421 kg)  BMI 32.82 kg/m2  SpO2 97%  LMP 03/08/2006  BP Readings from Last 3 Encounters:  07/21/15 142/80  06/24/15 132/98  11/20/14 128/84    Wt Readings from Last 3 Encounters:  07/21/15 179 lb 8 oz (81.421 kg)  06/24/15 178 lb (80.74 kg)  11/20/14 181 lb (82.101 kg)    Physical Exam  Constitutional: She appears well-developed. No distress.  HENT:  Head: Normocephalic.  Right Ear: External ear normal.  Left Ear: External ear normal.  Nose: Nose normal.  Mouth/Throat: Oropharynx is clear and moist.  Eyes: Conjunctivae are normal. Pupils are equal, round, and reactive to light. Right eye exhibits no discharge. Left eye exhibits no discharge.  Neck: Normal range of motion. Neck supple. No JVD present. No tracheal deviation present. No thyromegaly present.  Cardiovascular: Normal rate, regular rhythm and normal heart sounds.   Pulmonary/Chest: No stridor. No respiratory distress. She has no wheezes.  Abdominal: Soft. Bowel sounds are normal. She exhibits no distension and no mass. There is no tenderness. There is no rebound and no guarding.  Musculoskeletal: She exhibits no edema or tenderness.  Lymphadenopathy:    She has no cervical adenopathy.  Neurological: She displays normal reflexes. No cranial nerve deficit. She exhibits normal muscle tone. Coordination normal.  Skin: No rash noted. No erythema.  Psychiatric: She has a normal mood and affect. Her behavior is normal. Judgment and thought content normal.  obese   Lab Results  Component Value Date   WBC 9.4  04/04/2014   HGB 13.9 04/04/2014   HCT 41.7 04/04/2014   PLT 307.0 04/04/2014   GLUCOSE 92 04/04/2014   CHOL 327* 04/04/2014   TRIG 131.0 04/04/2014   HDL 38.10* 04/04/2014   LDLDIRECT 153.7 01/22/2013   LDLCALC 263* 04/04/2014   ALT 24 04/04/2014   AST 25 04/04/2014   NA 139 04/04/2014   K 4.2 04/04/2014   CL 109 04/04/2014   CREATININE 0.9 04/04/2014   BUN 16 04/04/2014   CO2 24  04/04/2014   TSH 4.81* 04/04/2014   HGBA1C 5.7 01/22/2013    Dg Finger Thumb Right  04/04/2014  CLINICAL DATA:  Pain and swelling at the metacarpophalangeal joint. EXAM: RIGHT THUMB 2+V COMPARISON:  None. FINDINGS: There are subtle slight arthritic changes of the first metacarpal phalangeal joint as well as of the interphalangeal joint of the thumb. No acute osseous abnormality. No soft tissue calcifications. IMPRESSION: Slight arthritic changes as described. Electronically Signed   By: Rozetta Nunnery M.D.   On: 04/04/2014 10:19    Assessment & Plan:   There are no diagnoses linked to this encounter. I am having Ms. Sassano maintain her aspirin, ibuprofen, Emollient (KERADAN EX), rosuvastatin, levothyroxine, ALPRAZolam, and azithromycin.  No orders of the defined types were placed in this encounter.     Follow-up: No Follow-up on file.  Walker Kehr, MD

## 2015-07-21 NOTE — Assessment & Plan Note (Signed)
Discussed.

## 2015-07-21 NOTE — Assessment & Plan Note (Signed)
On Levothroid Labs 

## 2015-07-21 NOTE — Patient Instructions (Signed)
Goal BP <130/85 °

## 2015-07-21 NOTE — Progress Notes (Signed)
Pre visit review using our clinic review tool, if applicable. No additional management support is needed unless otherwise documented below in the visit note. 

## 2015-07-21 NOTE — Assessment & Plan Note (Signed)
We discussed age appropriate health related issues, including available/recomended screening tests and vaccinations. We discussed a need for adhering to healthy diet and exercise. Labs/EKG were reviewed/ordered. All questions were answered.   

## 2015-07-21 NOTE — Assessment & Plan Note (Addendum)
Labs - worse. Re-start Crestor

## 2015-08-20 ENCOUNTER — Ambulatory Visit (INDEPENDENT_AMBULATORY_CARE_PROVIDER_SITE_OTHER): Payer: BC Managed Care – PPO

## 2015-08-20 DIAGNOSIS — E538 Deficiency of other specified B group vitamins: Secondary | ICD-10-CM

## 2015-08-20 MED ORDER — CYANOCOBALAMIN 1000 MCG/ML IJ SOLN
1000.0000 ug | Freq: Once | INTRAMUSCULAR | Status: AC
  Administered 2015-08-20: 1000 ug via INTRAMUSCULAR

## 2015-08-29 ENCOUNTER — Other Ambulatory Visit: Payer: Self-pay | Admitting: Internal Medicine

## 2015-09-22 ENCOUNTER — Ambulatory Visit (INDEPENDENT_AMBULATORY_CARE_PROVIDER_SITE_OTHER): Payer: BC Managed Care – PPO

## 2015-09-22 DIAGNOSIS — E538 Deficiency of other specified B group vitamins: Secondary | ICD-10-CM | POA: Diagnosis not present

## 2015-09-22 MED ORDER — CYANOCOBALAMIN 1000 MCG/ML IJ SOLN
1000.0000 ug | Freq: Once | INTRAMUSCULAR | Status: AC
  Administered 2015-09-22: 1000 ug via INTRAMUSCULAR

## 2015-10-04 ENCOUNTER — Other Ambulatory Visit: Payer: Self-pay | Admitting: Internal Medicine

## 2015-10-05 ENCOUNTER — Telehealth: Payer: Self-pay | Admitting: Internal Medicine

## 2015-10-05 NOTE — Telephone Encounter (Signed)
Pt called in and would like to know the update or status of this form?

## 2015-10-05 NOTE — Telephone Encounter (Signed)
After speaking with AVP's assistant, form is likely still in his office to be signed.  I spoke with patient and asked her to refax to my attention and I will complete today.

## 2015-10-05 NOTE — Telephone Encounter (Signed)
Paperwork signed and faxed to [phone] per pt request. Pt advised of same, copy of form sent to scan, original mailed

## 2015-10-05 NOTE — Telephone Encounter (Signed)
Form received and placed on A Side counter for MD's signature

## 2015-10-05 NOTE — Telephone Encounter (Signed)
Vanessa [name],  Please follow up with dr plotnikov regarding health history form dropped off on 09/22/2015. FYI note from [name]  Please call patient with an update

## 2015-10-06 NOTE — Telephone Encounter (Signed)
OK to fill this prescription with additional refills x2 Thank you!  

## 2015-10-06 NOTE — Telephone Encounter (Signed)
Received call pt states pharmacy is waiting on response back on refill on her xanax. Inform ot request has been receive waiting on approval from md. Is this ok to refill...Vanessa [name]

## 2015-10-06 NOTE — Telephone Encounter (Signed)
Refill called into [facility] had to leave on pharmacy vm...Johny Chess

## 2015-10-22 ENCOUNTER — Ambulatory Visit (INDEPENDENT_AMBULATORY_CARE_PROVIDER_SITE_OTHER): Payer: BC Managed Care – PPO

## 2015-10-22 DIAGNOSIS — E538 Deficiency of other specified B group vitamins: Secondary | ICD-10-CM | POA: Diagnosis not present

## 2015-10-22 MED ORDER — CYANOCOBALAMIN 1000 MCG/ML IJ SOLN
1000.0000 ug | Freq: Once | INTRAMUSCULAR | Status: AC
  Administered 2015-10-22: 1000 ug via INTRAMUSCULAR

## 2015-11-04 ENCOUNTER — Encounter: Payer: BC Managed Care – PPO | Admitting: Gynecology

## 2015-11-06 ENCOUNTER — Encounter: Payer: Self-pay | Admitting: Internal Medicine

## 2015-11-06 ENCOUNTER — Ambulatory Visit (INDEPENDENT_AMBULATORY_CARE_PROVIDER_SITE_OTHER): Payer: BC Managed Care – PPO | Admitting: Internal Medicine

## 2015-11-06 DIAGNOSIS — J069 Acute upper respiratory infection, unspecified: Secondary | ICD-10-CM | POA: Diagnosis not present

## 2015-11-06 MED ORDER — FLUTICASONE PROPIONATE 50 MCG/ACT NA SUSP: 2.0000 | Freq: Every day | NASAL | 6 refills | Status: DC

## 2015-11-06 NOTE — Assessment & Plan Note (Signed)
Rx for flonase, no indication for antibiotics today. Advised her of the likely course of 7-10 days. She will continue the zyrtec and recommended she likely does not need mucinex as she does not have a cough. She asked several times for a z-pack and reinforced that this was not indication and antibiotics can cause harm if taken when not needed.

## 2015-11-06 NOTE — Patient Instructions (Signed)
We think that you have a viral illness which can last 7-10 days. Typically the symptoms are worst around day 5.   You can continue to take the zyrtec and we have sent in flonase which is a nose spray. Take 2 sprays in each nostril daily to help with the sinuses.   It is okay to use warm liquids or honey to help with the sore throat.   Upper Respiratory Infection, Adult Most upper respiratory infections (URIs) are a viral infection of the air passages leading to the lungs. A URI affects the nose, throat, and upper air passages. The most common type of URI is nasopharyngitis and is typically referred to as "the common cold." URIs run their course and usually go away on their own. Most of the time, a URI does not require medical attention, but sometimes a bacterial infection in the upper airways can follow a viral infection. This is called a secondary infection. Sinus and middle ear infections are common types of secondary upper respiratory infections. Bacterial pneumonia can also complicate a URI. A URI can worsen asthma and chronic obstructive pulmonary disease (COPD). Sometimes, these complications can require emergency medical care and may be life threatening.  CAUSES Almost all URIs are caused by viruses. A virus is a type of germ and can spread from one person to another.  RISKS FACTORS You may be at risk for a URI if:   You smoke.   You have chronic heart or lung disease.  You have a weakened defense (immune) system.   You are very young or very old.   You have nasal allergies or asthma.  You work in crowded or poorly ventilated areas.  You work in health care facilities or schools. SIGNS AND SYMPTOMS  Symptoms typically develop 2-3 days after you come in contact with a cold virus. Most viral URIs last 7-10 days. However, viral URIs from the influenza virus (flu virus) can last 14-18 days and are typically more severe. Symptoms may include:   Runny or stuffy (congested) nose.    Sneezing.   Cough.   Sore throat.   Headache.   Fatigue.   Fever.   Loss of appetite.   Pain in your forehead, behind your eyes, and over your cheekbones (sinus pain).  Muscle aches.  DIAGNOSIS  Your health care provider may diagnose a URI by:  Physical exam.  Tests to check that your symptoms are not due to another condition such as:  Strep throat.  Sinusitis.  Pneumonia.  Asthma. TREATMENT  A URI goes away on its own with time. It cannot be cured with medicines, but medicines may be prescribed or recommended to relieve symptoms. Medicines may help:  Reduce your fever.  Reduce your cough.  Relieve nasal congestion. HOME CARE INSTRUCTIONS   Take medicines only as directed by your health care provider.   Gargle warm saltwater or take cough drops to comfort your throat as directed by your health care provider.  Use a warm mist humidifier or inhale steam from a shower to increase air moisture. This may make it easier to breathe.  Drink enough fluid to keep your urine clear or pale yellow.   Eat soups and other clear broths and maintain good nutrition.   Rest as needed.   Return to work when your temperature has returned to normal or as your health care provider advises. You may need to stay home longer to avoid infecting others. You can also use a face mask and careful  hand washing to prevent spread of the virus.  Increase the usage of your inhaler if you have asthma.   Do not use any tobacco products, including cigarettes, chewing tobacco, or electronic cigarettes. If you need help quitting, ask your health care provider. PREVENTION  The best way to protect yourself from getting a cold is to practice good hygiene.   Avoid oral or hand contact with people with cold symptoms.   Wash your hands often if contact occurs.  There is no clear evidence that vitamin C, vitamin E, echinacea, or exercise reduces the chance of developing a cold.  However, it is always recommended to get plenty of rest, exercise, and practice good nutrition.  SEEK MEDICAL CARE IF:   You are getting worse rather than better.   Your symptoms are not controlled by medicine.   You have chills.  You have worsening shortness of breath.  You have brown or red mucus.  You have yellow or brown nasal discharge.  You have pain in your face, especially when you bend forward.  You have a fever.  You have swollen neck glands.  You have pain while swallowing.  You have white areas in the back of your throat. SEEK IMMEDIATE MEDICAL CARE IF:   You have severe or persistent:  Headache.  Ear pain.  Sinus pain.  Chest pain.  You have chronic lung disease and any of the following:  Wheezing.  Prolonged cough.  Coughing up blood.  A change in your usual mucus.  You have a stiff neck.  You have changes in your:  Vision.  Hearing.  Thinking.  Mood. MAKE SURE YOU:   Understand these instructions.  Will watch your condition.  Will get help right away if you are not doing well or get worse.   This information is not intended to replace advice given to you by your health care provider. Make sure you discuss any questions you have with your health care provider.   Document Released: 09/07/2000 Document Revised: 07/29/2014 Document Reviewed: 06/19/2013 Elsevier Interactive Patient Education [employer].

## 2015-11-06 NOTE — Progress Notes (Signed)
   Subjective:    Patient ID: Vanessa [name], female    DOB: March 26, 1961, 55 y.o.   MRN: [id number]  HPI The patient is a 55 YO female coming in for sinus congestion. She kept her grandchild last week (98months) and they were dripping some stuff and congested. She started feeling bad this Tuesday. She started taking zyrtec and mucinex which has not helped significantly. She is also feeling achy and not well. Denies fevers or chills. Denies cough or SOB. Mild sore throat. Some ear fullness. Overall symptoms are gradually worsening.   Review of Systems  Constitutional: Positive for activity change. Negative for appetite change, chills, fatigue, fever and unexpected weight change.  HENT: Positive for congestion, ear pain, sinus pressure and sore throat. Negative for ear discharge, postnasal drip, rhinorrhea and trouble swallowing.   Eyes: Positive for discharge and itching. Negative for photophobia, pain, redness and visual disturbance.  Respiratory: Negative.   Cardiovascular: Negative.   Gastrointestinal: Negative.   Musculoskeletal: Negative.       Objective:   Physical Exam  Constitutional: She is oriented to person, place, and time. She appears well-developed and well-nourished.  HENT:  Head: Normocephalic and atraumatic.  Right Ear: External ear normal.  Left Ear: External ear normal.  Oropharynx with some tonsillar swelling and redness, no discharge or drainage.   Eyes: EOM are normal.  Neck: Normal range of motion. No JVD present.  Cardiovascular: Normal rate and regular rhythm.   Pulmonary/Chest: Effort normal and breath sounds normal. No respiratory distress. She has no wheezes. She has no rales.  Abdominal: Soft. She exhibits no distension. There is no tenderness. There is no rebound.  Lymphadenopathy:    She has no cervical adenopathy.  Neurological: She is alert and oriented to person, place, and time. Coordination normal.   Vitals:   11/06/15 1540  BP: 130/80  Pulse: 81   Resp: 18  Temp: 98.3 F (36.8 C)  TempSrc: Oral  SpO2: 98%  Weight: 178 lb 6.4 oz (80.9 kg)  Height: 5\' 3"  (1.6 m)      Assessment & Plan:

## 2015-11-06 NOTE — Progress Notes (Signed)
Pre visit review using our clinic review tool, if applicable. No additional management support is needed unless otherwise documented below in the visit note. 

## 2015-11-07 ENCOUNTER — Ambulatory Visit: Payer: BC Managed Care – PPO | Admitting: Internal Medicine

## 2015-11-23 ENCOUNTER — Ambulatory Visit (INDEPENDENT_AMBULATORY_CARE_PROVIDER_SITE_OTHER): Payer: BC Managed Care – PPO

## 2015-11-23 DIAGNOSIS — E538 Deficiency of other specified B group vitamins: Secondary | ICD-10-CM | POA: Diagnosis not present

## 2015-11-23 MED ORDER — CYANOCOBALAMIN 1000 MCG/ML IJ SOLN
1000.0000 ug | Freq: Once | INTRAMUSCULAR | Status: AC
  Administered 2015-11-23: 1000 ug via INTRAMUSCULAR

## 2015-11-24 ENCOUNTER — Encounter: Payer: BC Managed Care – PPO | Admitting: Gynecology

## 2015-12-01 ENCOUNTER — Ambulatory Visit (INDEPENDENT_AMBULATORY_CARE_PROVIDER_SITE_OTHER): Payer: BC Managed Care – PPO | Admitting: General Practice

## 2015-12-01 DIAGNOSIS — Z111 Encounter for screening for respiratory tuberculosis: Secondary | ICD-10-CM

## 2015-12-01 MED ORDER — TUBERCULIN PPD 5 UNIT/0.1ML ID SOLN
5.0000 [IU] | Freq: Once | INTRADERMAL | Status: AC
  Administered 2015-12-01: 5 [IU] via INTRADERMAL

## 2015-12-02 DIAGNOSIS — Z111 Encounter for screening for respiratory tuberculosis: Secondary | ICD-10-CM | POA: Diagnosis not present

## 2015-12-02 NOTE — Progress Notes (Signed)
I have re-entered ppd screen order that was ordered incorrectly yesterday---per cindy---patient has never tested positive before

## 2015-12-02 NOTE — Addendum Note (Signed)
Addended by: Ander Slade on: 12/02/2015 09:00 AM   Modules accepted: Orders

## 2015-12-03 ENCOUNTER — Encounter: Payer: Self-pay | Admitting: General Practice

## 2015-12-03 LAB — TB SKIN TEST: TB Skin Test: NEGATIVE

## 2015-12-24 ENCOUNTER — Ambulatory Visit (INDEPENDENT_AMBULATORY_CARE_PROVIDER_SITE_OTHER): Payer: BC Managed Care – PPO | Admitting: Emergency Medicine

## 2015-12-24 ENCOUNTER — Ambulatory Visit: Payer: BC Managed Care – PPO

## 2015-12-24 DIAGNOSIS — E538 Deficiency of other specified B group vitamins: Secondary | ICD-10-CM | POA: Diagnosis not present

## 2015-12-24 MED ORDER — CYANOCOBALAMIN 1000 MCG/ML IJ SOLN
1000.0000 ug | Freq: Once | INTRAMUSCULAR | Status: AC
  Administered 2015-12-24: 1000 ug via INTRAMUSCULAR

## 2016-01-06 LAB — HM MAMMOGRAPHY

## 2016-01-08 ENCOUNTER — Encounter: Payer: Self-pay | Admitting: Internal Medicine

## 2016-01-25 ENCOUNTER — Ambulatory Visit: Payer: BC Managed Care – PPO

## 2016-01-28 ENCOUNTER — Ambulatory Visit (INDEPENDENT_AMBULATORY_CARE_PROVIDER_SITE_OTHER): Payer: BC Managed Care – PPO

## 2016-01-28 DIAGNOSIS — E538 Deficiency of other specified B group vitamins: Secondary | ICD-10-CM

## 2016-01-28 MED ORDER — CYANOCOBALAMIN 1000 MCG/ML IJ SOLN
1000.0000 ug | Freq: Once | INTRAMUSCULAR | Status: AC
  Administered 2016-01-28: 1000 ug via INTRAMUSCULAR

## 2016-02-05 ENCOUNTER — Encounter: Payer: BC Managed Care – PPO | Admitting: Gynecology

## 2016-02-26 ENCOUNTER — Other Ambulatory Visit: Payer: Self-pay | Admitting: Internal Medicine

## 2016-02-29 ENCOUNTER — Ambulatory Visit: Payer: BC Managed Care – PPO

## 2016-03-01 ENCOUNTER — Ambulatory Visit (INDEPENDENT_AMBULATORY_CARE_PROVIDER_SITE_OTHER): Payer: BC Managed Care – PPO | Admitting: General Practice

## 2016-03-01 DIAGNOSIS — E538 Deficiency of other specified B group vitamins: Secondary | ICD-10-CM

## 2016-03-01 MED ORDER — CYANOCOBALAMIN 1000 MCG/ML IJ SOLN
1000.0000 ug | Freq: Once | INTRAMUSCULAR | Status: AC
  Administered 2016-03-01: 1000 ug via INTRAMUSCULAR

## 2016-03-01 NOTE — Telephone Encounter (Signed)
Pt left msg on triage stating pharmacy been trying to get refill on her alprazolam since Friday Dec 1st. Requesting refill asap...Vanessa [name]

## 2016-03-01 NOTE — Telephone Encounter (Signed)
Patient has called back in regard.  States she was in office this morning and Jenny Reichmann told her it was getting faxed then to [facility].  Patient states [facility] does not have this.  Please follow up with patient in regard.

## 2016-03-02 NOTE — Telephone Encounter (Signed)
Ok to Rf per PCP with 3 refills. Rf phoned in. See meds. Pt informed

## 2016-03-02 NOTE — Telephone Encounter (Signed)
Patient is calling again about this rx. Please follow up, Thank you.

## 2016-03-25 ENCOUNTER — Encounter: Payer: Self-pay | Admitting: Gynecology

## 2016-03-25 ENCOUNTER — Ambulatory Visit (INDEPENDENT_AMBULATORY_CARE_PROVIDER_SITE_OTHER): Payer: BC Managed Care – PPO | Admitting: Gynecology

## 2016-03-25 VITALS — BP 130/80 | Ht 63.0 in | Wt 174.0 lb

## 2016-03-25 DIAGNOSIS — Z8639 Personal history of other endocrine, nutritional and metabolic disease: Secondary | ICD-10-CM

## 2016-03-25 DIAGNOSIS — Z01419 Encounter for gynecological examination (general) (routine) without abnormal findings: Secondary | ICD-10-CM

## 2016-03-25 DIAGNOSIS — Z780 Asymptomatic menopausal state: Secondary | ICD-10-CM | POA: Diagnosis not present

## 2016-03-25 NOTE — Addendum Note (Signed)
Addended by: Alen Blew on: 03/25/2016 11:04 AM   Modules accepted: Orders

## 2016-03-25 NOTE — Patient Instructions (Signed)
Bone Densitometry Introduction Bone densitometry is an imaging test that uses a special X-ray to measure the amount of calcium and other minerals in your bones (bone density). This test is also known as a bone mineral density test or dual-energy X-ray absorptiometry (DXA). The test can measure bone density at your hip and your spine. It is similar to having a regular X-ray. You may have this test to:  Diagnose a condition that causes weak or thin bones (osteoporosis).  Predict your risk of a broken bone (fracture).  Determine how well osteoporosis treatment is working. Tell a health care provider about:  Any allergies you have.  All medicines you are taking, including vitamins, herbs, eye drops, creams, and over-the-counter medicines.  Any problems you or family members have had with anesthetic medicines.  Any blood disorders you have.  Any surgeries you have had.  Any medical conditions you have.  Possibility of pregnancy.  Any other medical test you had within the previous 14 days that used contrast material. What are the risks? Generally, this is a safe procedure. However, problems can occur and may include the following:  This test exposes you to a very small amount of radiation.  The risks of radiation exposure may be greater to unborn children. What happens before the procedure?  Do not take any calcium supplements for 24 hours before having the test. You can otherwise eat and drink what you usually do.  Take off all metal jewelry, eyeglasses, dental appliances, and any other metal objects. What happens during the procedure?  You may lie on an exam table. There will be an X-ray generator below you and an imaging device above you.  Other devices, such as boxes or braces, may be used to position your body properly for the scan.  You will need to lie still while the machine slowly scans your body.  The images will show up on a computer monitor. What happens after the  procedure? You may need more testing at a later time. This information is not intended to replace advice given to you by your health care provider. Make sure you discuss any questions you have with your health care provider. Document Released: 04/05/2004 Document Revised: 08/20/2015 Document Reviewed: 08/22/2013  2017 Elsevier  

## 2016-03-25 NOTE — Progress Notes (Signed)
Sullivan Backhus [id number] [birth year] [id number]   History:    55 y.o.  for annual gyn exam with no major complaints only occasional hot flashes. Patient on no hormone replacement therapy. Patient last seen in the office April 2015. Patient recently menopause in 2009 at which time the Mirena IUD had been removed based on an elevated [place]. Patient reports no vaginal bleeding. Patient had colonoscopy in 2014 whereby several benign polyps were removed. Patient has not had a bone density study as of yet. Her PCP has been doing her blood work. Patient declines flu vaccine. Patient stated that she had vitamin D deficiency in her PCP placed her on a 3 month treatment course but she never returned to check her vitamin D level. She's currently not taking any vitamin D.  Past medical history,surgical history, family history and social history were all reviewed and documented in the EPIC chart.  Gynecologic History Patient's last menstrual period was 03/08/2006. Contraception: post menopausal status Last Pap: 2015. Results were: normal Last mammogram: 2017. Results were: Normal but dense  Obstetric History OB History  Gravida Para Term Preterm AB Living  2 2       2   SAB TAB Ectopic Multiple Live Births               # Outcome Date GA Lbr Len/2nd Weight Sex Delivery Anes PTL Lv  2 Para           1 Para                ROS: A ROS was performed and pertinent positives and negatives are included in the history.  GENERAL: No fevers or chills. HEENT: No change in vision, no earache, sore throat or sinus congestion. NECK: No pain or stiffness. CARDIOVASCULAR: No chest pain or pressure. No palpitations. PULMONARY: No shortness of breath, cough or wheeze. GASTROINTESTINAL: No abdominal pain, nausea, vomiting or diarrhea, melena or bright red blood per rectum. GENITOURINARY: No urinary frequency, urgency, hesitancy or dysuria. MUSCULOSKELETAL: No joint or muscle pain, no back pain, no recent trauma. DERMATOLOGIC: No  rash, no itching, no lesions. ENDOCRINE: No polyuria, polydipsia, no heat or cold intolerance. No recent change in weight. HEMATOLOGICAL: No anemia or easy bruising or bleeding. NEUROLOGIC: No headache, seizures, numbness, tingling or weakness. PSYCHIATRIC: No depression, no loss of interest in normal activity or change in sleep pattern.     Exam: chaperone present  BP 130/80   Ht 5\' 3"  (1.6 m)   Wt 174 lb (78.9 kg)   LMP 03/08/2006   BMI 30.82 kg/m   Body mass index is 30.82 kg/m.  General appearance : Well developed well nourished female. No acute distress HEENT: Eyes: no retinal hemorrhage or exudates,  Neck supple, trachea midline, no carotid bruits, no thyroidmegaly Lungs: Clear to auscultation, no rhonchi or wheezes, or rib retractions  Heart: Regular rate and rhythm, no murmurs or gallops Breast:Examined in sitting and supine position were symmetrical in appearance, no palpable masses or tenderness,  no skin retraction, no nipple inversion, no nipple discharge, no skin discoloration, no axillary or supraclavicular lymphadenopathy Abdomen: no palpable masses or tenderness, no rebound or guarding Extremities: no edema or skin discoloration or tenderness  Pelvic:  Bartholin, Urethra, Skene Glands: Within normal limits             Vagina: No gross lesions or discharge  Cervix: No gross lesions or discharge  Uterus  anteverted, normal size, shape and consistency, non-tender and mobile  Adnexa  Without masses or tenderness  Anus and perineum  normal   Rectovaginal  normal sphincter tone without palpated masses or tenderness             Hemoccult cards will be provided     Assessment/Plan:  55 y.o. female for annual exam with past history vitamin D deficiency will need to be tested today. I have encouraged her to take 2000 units of vitamin D 3 cholecalciferol daily. Her PCP has been doing the rest of her blood work. We are going to schedule a baseline bone density study here in  the office the next few weeks. Pap smear done today in the office. Patient declined flu vaccine today. Patient was reminded that when she has her mammogram next year to request a three-dimensional mammogram due to the fact that her breasts were dense. She was also provided with fecal Hemoccult cards to submit to the office for testing.   Terrance Mass MD, 10:41 AM 03/25/2016  Patient ID: Vanessa [name], female   DOB: Jul 30, 1960, 55 y.o.   MRN: [id number]

## 2016-03-26 LAB — VITAMIN D 25 HYDROXY (VIT D DEFICIENCY, FRACTURES): VIT D 25 HYDROXY: 28 ng/mL — AB (ref 30–100)

## 2016-03-27 ENCOUNTER — Encounter: Payer: Self-pay | Admitting: Gynecology

## 2016-03-29 ENCOUNTER — Other Ambulatory Visit: Payer: Self-pay | Admitting: *Deleted

## 2016-03-29 DIAGNOSIS — E559 Vitamin D deficiency, unspecified: Secondary | ICD-10-CM

## 2016-03-29 LAB — PAP IG W/ RFLX HPV ASCU

## 2016-03-29 MED ORDER — VITAMIN D (ERGOCALCIFEROL) 1.25 MG (50000 UNIT) PO CAPS: ORAL_CAPSULE | ORAL | 0 refills | Status: DC

## 2016-04-01 ENCOUNTER — Ambulatory Visit: Payer: BC Managed Care – PPO

## 2016-04-01 ENCOUNTER — Telehealth: Payer: Self-pay

## 2016-04-01 NOTE — Telephone Encounter (Signed)
Patient called in voice mail for pap smear results and to ask a question about her Vit D level.  I called her back and got her voice mail. I informed her that her pap smear is normal. I repeated the VIT D level instructions and follow up instructions. I asked her to call me back if that did not answer her question about VIT D.

## 2016-04-05 ENCOUNTER — Ambulatory Visit (INDEPENDENT_AMBULATORY_CARE_PROVIDER_SITE_OTHER): Payer: BC Managed Care – PPO | Admitting: General Practice

## 2016-04-05 DIAGNOSIS — E538 Deficiency of other specified B group vitamins: Secondary | ICD-10-CM | POA: Diagnosis not present

## 2016-04-05 MED ORDER — CYANOCOBALAMIN 1000 MCG/ML IJ SOLN
1000.0000 ug | Freq: Once | INTRAMUSCULAR | Status: AC
  Administered 2016-04-05: 1000 ug via INTRAMUSCULAR

## 2016-05-09 ENCOUNTER — Ambulatory Visit (INDEPENDENT_AMBULATORY_CARE_PROVIDER_SITE_OTHER): Payer: BC Managed Care – PPO

## 2016-05-09 DIAGNOSIS — E538 Deficiency of other specified B group vitamins: Secondary | ICD-10-CM | POA: Diagnosis not present

## 2016-05-09 MED ORDER — CYANOCOBALAMIN 1000 MCG/ML IJ SOLN
1000.0000 ug | Freq: Once | INTRAMUSCULAR | Status: AC
  Administered 2016-05-09: 1000 ug via INTRAMUSCULAR

## 2016-06-06 ENCOUNTER — Ambulatory Visit: Payer: BC Managed Care – PPO

## 2016-06-08 ENCOUNTER — Ambulatory Visit (INDEPENDENT_AMBULATORY_CARE_PROVIDER_SITE_OTHER): Payer: BC Managed Care – PPO

## 2016-06-08 DIAGNOSIS — E538 Deficiency of other specified B group vitamins: Secondary | ICD-10-CM | POA: Diagnosis not present

## 2016-06-08 MED ORDER — CYANOCOBALAMIN 1000 MCG/ML IJ SOLN
1000.0000 ug | Freq: Once | INTRAMUSCULAR | Status: AC
  Administered 2016-06-08: 1000 ug via INTRAMUSCULAR

## 2016-06-17 ENCOUNTER — Other Ambulatory Visit: Payer: BC Managed Care – PPO

## 2016-06-22 ENCOUNTER — Other Ambulatory Visit: Payer: BC Managed Care – PPO

## 2016-06-22 DIAGNOSIS — E559 Vitamin D deficiency, unspecified: Secondary | ICD-10-CM

## 2016-06-23 ENCOUNTER — Other Ambulatory Visit: Payer: Self-pay | Admitting: Gynecology

## 2016-06-23 LAB — VITAMIN D 25 HYDROXY (VIT D DEFICIENCY, FRACTURES): Vit D, 25-Hydroxy: 33 ng/mL (ref 30–100)

## 2016-06-26 ENCOUNTER — Other Ambulatory Visit: Payer: Self-pay | Admitting: Internal Medicine

## 2016-06-27 ENCOUNTER — Telehealth: Payer: Self-pay | Admitting: *Deleted

## 2016-06-27 NOTE — Telephone Encounter (Signed)
Pt informed normal vitamin d results on 06/22/16, will take OTC vitamin d 2000 units daily.

## 2016-07-11 ENCOUNTER — Ambulatory Visit: Payer: BC Managed Care – PPO

## 2016-07-15 ENCOUNTER — Ambulatory Visit: Payer: BC Managed Care – PPO

## 2016-07-18 ENCOUNTER — Ambulatory Visit (INDEPENDENT_AMBULATORY_CARE_PROVIDER_SITE_OTHER): Payer: BC Managed Care – PPO

## 2016-07-18 DIAGNOSIS — E538 Deficiency of other specified B group vitamins: Secondary | ICD-10-CM | POA: Diagnosis not present

## 2016-07-18 MED ORDER — CYANOCOBALAMIN 1000 MCG/ML IJ SOLN
1000.0000 ug | Freq: Once | INTRAMUSCULAR | Status: AC
Start: 2016-07-18 — End: 2016-07-18
  Administered 2016-07-18: 1000 ug via INTRAMUSCULAR

## 2016-07-18 NOTE — Telephone Encounter (Signed)
Routing to dr [employer], patient's last b12 lab was April/2017--wellness visit---do you want patient to continue b12 injections, please advise, thanks

## 2016-07-29 ENCOUNTER — Ambulatory Visit: Payer: BC Managed Care – PPO | Admitting: Internal Medicine

## 2016-07-30 ENCOUNTER — Other Ambulatory Visit: Payer: Self-pay | Admitting: Internal Medicine

## 2016-08-01 NOTE — Telephone Encounter (Signed)
Pt called about this refill. She has been out of it since last week. She scheduled a physical on 5/23. Can this be sent in for her today?

## 2016-08-01 NOTE — Telephone Encounter (Signed)
30 day script sent to [facility]...Vanessa [name]

## 2016-08-10 ENCOUNTER — Encounter: Payer: Self-pay | Admitting: Gynecology

## 2016-08-17 ENCOUNTER — Encounter: Payer: Self-pay | Admitting: Internal Medicine

## 2016-08-17 ENCOUNTER — Ambulatory Visit (INDEPENDENT_AMBULATORY_CARE_PROVIDER_SITE_OTHER): Payer: BC Managed Care – PPO | Admitting: Internal Medicine

## 2016-08-17 ENCOUNTER — Other Ambulatory Visit (INDEPENDENT_AMBULATORY_CARE_PROVIDER_SITE_OTHER): Payer: BC Managed Care – PPO

## 2016-08-17 VITALS — BP 128/86 | HR 80 | Temp 98.3°F | Ht 63.0 in | Wt 173.0 lb

## 2016-08-17 DIAGNOSIS — J301 Allergic rhinitis due to pollen: Secondary | ICD-10-CM

## 2016-08-17 DIAGNOSIS — I10 Essential (primary) hypertension: Secondary | ICD-10-CM | POA: Diagnosis not present

## 2016-08-17 DIAGNOSIS — E538 Deficiency of other specified B group vitamins: Secondary | ICD-10-CM

## 2016-08-17 DIAGNOSIS — Z0000 Encounter for general adult medical examination without abnormal findings: Secondary | ICD-10-CM

## 2016-08-17 DIAGNOSIS — F411 Generalized anxiety disorder: Secondary | ICD-10-CM

## 2016-08-17 DIAGNOSIS — R7989 Other specified abnormal findings of blood chemistry: Secondary | ICD-10-CM | POA: Diagnosis not present

## 2016-08-17 DIAGNOSIS — E034 Atrophy of thyroid (acquired): Secondary | ICD-10-CM

## 2016-08-17 LAB — CBC WITH DIFFERENTIAL/PLATELET
BASOS ABS: 0 10*3/uL (ref 0.0–0.1)
BASOS PCT: 0.2 % (ref 0.0–3.0)
EOS ABS: 0.6 10*3/uL (ref 0.0–0.7)
Eosinophils Relative: 5 % (ref 0.0–5.0)
HEMATOCRIT: 41.4 % (ref 36.0–46.0)
HEMOGLOBIN: 14 g/dL (ref 12.0–15.0)
LYMPHS PCT: 29.9 % (ref 12.0–46.0)
Lymphs Abs: 3.5 10*3/uL (ref 0.7–4.0)
MCHC: 33.9 g/dL (ref 30.0–36.0)
MCV: 86.7 fl (ref 78.0–100.0)
MONOS PCT: 6 % (ref 3.0–12.0)
Monocytes Absolute: 0.7 10*3/uL (ref 0.1–1.0)
Neutro Abs: 7 10*3/uL (ref 1.4–7.7)
Neutrophils Relative %: 58.9 % (ref 43.0–77.0)
Platelets: 347 10*3/uL (ref 150.0–400.0)
RBC: 4.77 Mil/uL (ref 3.87–5.11)
RDW: 14.2 % (ref 11.5–15.5)
WBC: 11.8 10*3/uL — AB (ref 4.0–10.5)

## 2016-08-17 LAB — LIPID PANEL
CHOLESTEROL: 292 mg/dL — AB (ref 0–200)
HDL: 32.6 mg/dL — ABNORMAL LOW (ref >39.00)
NonHDL: 259.8
Total CHOL/HDL Ratio: 9
Triglycerides: 359 mg/dL — ABNORMAL HIGH (ref 0.0–149.0)
VLDL: 71.8 mg/dL — ABNORMAL HIGH (ref 0.0–40.0)

## 2016-08-17 LAB — HEPATIC FUNCTION PANEL
ALBUMIN: 4.3 g/dL (ref 3.5–5.2)
ALK PHOS: 57 U/L (ref 39–117)
ALT: 14 U/L (ref 0–35)
AST: 17 U/L (ref 0–37)
Bilirubin, Direct: 0.1 mg/dL (ref 0.0–0.3)
Total Bilirubin: 0.3 mg/dL (ref 0.2–1.2)
Total Protein: 7.9 g/dL (ref 6.0–8.3)

## 2016-08-17 LAB — BASIC METABOLIC PANEL
BUN: 21 mg/dL (ref 6–23)
CO2: 28 mEq/L (ref 19–32)
CREATININE: 0.98 mg/dL (ref 0.40–1.20)
Calcium: 9.5 mg/dL (ref 8.4–10.5)
Chloride: 103 mEq/L (ref 96–112)
GFR: 75.42 mL/min (ref >60.00)
Glucose, Bld: 98 mg/dL (ref 70–99)
Potassium: 4.1 mEq/L (ref 3.5–5.1)
Sodium: 136 mEq/L (ref 135–145)

## 2016-08-17 LAB — LDL CHOLESTEROL, DIRECT: LDL DIRECT: 209 mg/dL

## 2016-08-17 LAB — URINALYSIS
BILIRUBIN URINE: NEGATIVE
Ketones, ur: NEGATIVE
Leukocytes, UA: NEGATIVE
NITRITE: NEGATIVE
Specific Gravity, Urine: <=1.005 — AB (ref 1.000–1.030)
TOTAL PROTEIN, URINE-UPE24: NEGATIVE
URINE GLUCOSE: NEGATIVE
UROBILINOGEN UA: 0.2 (ref 0.0–1.0)
pH: 6.5 (ref 5.0–8.0)

## 2016-08-17 LAB — TSH: TSH: 1.09 u[IU]/mL (ref 0.35–4.50)

## 2016-08-17 MED ORDER — OLOPATADINE HCL 0.1 % OP SOLN: 1.0000 [drp] | Freq: Two times a day (BID) | OPHTHALMIC | 3 refills | Status: DC

## 2016-08-17 MED ORDER — CYANOCOBALAMIN 1000 MCG/ML IJ SOLN
1000.0000 ug | Freq: Once | INTRAMUSCULAR | Status: AC
  Administered 2016-08-17: 1000 ug via INTRAMUSCULAR

## 2016-08-17 MED ORDER — MONTELUKAST SODIUM 10 MG PO TABS: 10.0000 mg | ORAL_TABLET | Freq: Every day | ORAL | 3 refills | Status: DC

## 2016-08-17 MED ORDER — LORATADINE 10 MG PO TABS: 10.0000 mg | ORAL_TABLET | Freq: Every day | ORAL | 3 refills | Status: DC

## 2016-08-17 MED ORDER — METHYLPREDNISOLONE ACETATE 80 MG/ML IJ SUSP
80.0000 mg | Freq: Once | INTRAMUSCULAR | Status: AC
  Administered 2016-08-17: 80 mg via INTRAMUSCULAR

## 2016-08-17 MED ORDER — ALPRAZOLAM 0.25 MG PO TABS: 0.2500 mg | ORAL_TABLET | Freq: Two times a day (BID) | ORAL | 3 refills | Status: DC | PRN

## 2016-08-17 MED ORDER — LEVOTHYROXINE SODIUM 88 MCG PO TABS
88.0000 ug | ORAL_TABLET | Freq: Every day | ORAL | 3 refills | Status: DC
Start: 2016-08-17 — End: 2017-08-26

## 2016-08-17 NOTE — Assessment & Plan Note (Signed)
Loratidin Patanol Depomedrol 80 mg IM Singuliar

## 2016-08-17 NOTE — Assessment & Plan Note (Signed)
We discussed age appropriate health related issues, including available/recomended screening tests and vaccinations. We discussed a need for adhering to healthy diet and exercise. Labs were ordered to be later reviewed . All questions were answered.   

## 2016-08-17 NOTE — Patient Instructions (Signed)
shingrix

## 2016-08-17 NOTE — Progress Notes (Signed)
Subjective:  Patient ID: Vanessa [name], female    DOB: 1960-07-09  Age: 56 y.o. MRN: [id number]  CC: No chief complaint on file.   HPI Vanessa [name] presents for a well exam C/o sever allergies - clear d/c F/u hypothyroidism, anxiety  Outpatient Medications Prior to Visit  Medication Sig Dispense Refill  . ALPRAZolam (XANAX) 0.25 MG tablet TAKE ONE TABLET BY MOUTH TWICE DAILY AS NEEDED FOR ANXIETY 60 tablet 3  . Ascorbic Acid (VITAMIN C PO) Take by mouth.    Marland Kitchen aspirin 81 MG tablet Take 81 mg by mouth daily.      Marland Kitchen ibuprofen (ADVIL,MOTRIN) 600 MG tablet Take 1 tablet (600 mg total) by mouth every 8 (eight) hours as needed for pain. 60 tablet 0  . levothyroxine (SYNTHROID, LEVOTHROID) 88 MCG tablet Take 1 tablet (88 mcg total) by mouth daily. Must keep May 23rd appt for future refills 30 tablet 0  . rosuvastatin (CRESTOR) 20 MG tablet Take 1 tablet (20 mg total) by mouth daily. (Patient not taking: Reported on 08/17/2016) 30 tablet 11  . levothyroxine (SYNTHROID, LEVOTHROID) 88 MCG tablet Take 1 tablet (88 mcg total) by mouth daily. 30 tablet 11  . Vitamin D, Ergocalciferol, (DRISDOL) 50000 units CAPS capsule Take one tablet by mouth weekly for 12 weeks, after completing schedule lab appointment for vitamin d recheck. 12 capsule 0  . Vitamin D, Ergocalciferol, (DRISDOL) 50000 units CAPS capsule Take 1 capsule (50,000 Units total) by mouth once a week. Yearly physical due in May must see MD for refills 6 capsule 0   No facility-administered medications prior to visit.     ROS Review of Systems  Constitutional: Negative for activity change, appetite change, chills, fatigue and unexpected weight change.  HENT: Positive for congestion, postnasal drip, rhinorrhea and sinus pain. Negative for mouth sores and sinus pressure.   Eyes: Negative for visual disturbance.  Respiratory: Negative for cough and chest tightness.   Gastrointestinal: Negative for abdominal pain and nausea.    Genitourinary: Negative for difficulty urinating, frequency and vaginal pain.  Musculoskeletal: Negative for back pain and gait problem.  Skin: Negative for pallor and rash.  Neurological: Negative for dizziness, tremors, weakness, numbness and headaches.  Psychiatric/Behavioral: Negative for confusion and sleep disturbance.    Objective:  BP 128/86 (BP Location: Left Arm, Patient Position: Sitting, Cuff Size: Normal)   Pulse 80   Temp 98.3 F (36.8 C) (Oral)   Ht 5\' 3"  (1.6 m)   Wt 173 lb (78.5 kg)   LMP 03/08/2006   SpO2 98%   BMI 30.65 kg/m   BP Readings from Last 3 Encounters:  08/17/16 128/86  03/25/16 130/80  11/06/15 130/80    Wt Readings from Last 3 Encounters:  08/17/16 173 lb (78.5 kg)  03/25/16 174 lb (78.9 kg)  11/06/15 178 lb 6.4 oz (80.9 kg)    Physical Exam  Constitutional: She appears well-developed. No distress.  HENT:  Head: Normocephalic.  Right Ear: External ear normal.  Left Ear: External ear normal.  Nose: Nose normal.  Mouth/Throat: Oropharynx is clear and moist.  Eyes: Conjunctivae are normal. Pupils are equal, round, and reactive to light. Right eye exhibits no discharge. Left eye exhibits no discharge.  Neck: Normal range of motion. Neck supple. No JVD present. No tracheal deviation present. No thyromegaly present.  Cardiovascular: Normal rate, regular rhythm and normal heart sounds.   Pulmonary/Chest: No stridor. No respiratory distress. She has no wheezes.  Abdominal: Soft. Bowel sounds are  normal. She exhibits no distension and no mass. There is no tenderness. There is no rebound and no guarding.  Musculoskeletal: She exhibits no edema or tenderness.  Lymphadenopathy:    She has no cervical adenopathy.  Neurological: She displays normal reflexes. No cranial nerve deficit. She exhibits normal muscle tone. Coordination normal.  Skin: No rash noted. No erythema.  Psychiatric: She has a normal mood and affect. Her behavior is normal.  Judgment and thought content normal.  obese Swollen nasal mucosa  Lab Results  Component Value Date   WBC 11.6 (H) 07/21/2015   HGB 14.4 07/21/2015   HCT 42.1 07/21/2015   PLT 330.0 07/21/2015   GLUCOSE 89 07/21/2015   CHOL 314 (H) 07/21/2015   TRIG 144.0 07/21/2015   HDL 37.40 (L) 07/21/2015   LDLDIRECT 153.7 01/22/2013   LDLCALC 248 (H) 07/21/2015   ALT 14 07/21/2015   AST 15 07/21/2015   NA 138 07/21/2015   K 4.1 07/21/2015   CL 104 07/21/2015   CREATININE 0.86 07/21/2015   BUN 19 07/21/2015   CO2 29 07/21/2015   TSH 1.37 07/21/2015   HGBA1C 5.7 07/21/2015    Dg Finger Thumb Right  Result Date: 04/04/2014 CLINICAL DATA:  Pain and swelling at the metacarpophalangeal joint. EXAM: RIGHT THUMB 2+V COMPARISON:  None. FINDINGS: There are subtle slight arthritic changes of the first metacarpal phalangeal joint as well as of the interphalangeal joint of the thumb. No acute osseous abnormality. No soft tissue calcifications. IMPRESSION: Slight arthritic changes as described. Electronically Signed   By: Rozetta Nunnery M.D.   On: 04/04/2014 10:19    Assessment & Plan:   There are no diagnoses linked to this encounter. I have discontinued Ms. Emme's Vitamin D (Ergocalciferol) and Vitamin D (Ergocalciferol). I am also having her maintain her aspirin, ibuprofen, rosuvastatin, ALPRAZolam, Ascorbic Acid (VITAMIN C PO), levothyroxine, and cholecalciferol.  Meds ordered this encounter  Medications  . cholecalciferol (VITAMIN D) 1000 units tablet    Sig: Take 1,000 Units by mouth daily.     Follow-up: No Follow-up on file.  Walker Kehr, MD

## 2016-08-17 NOTE — Assessment & Plan Note (Signed)
Labs Levothroid 

## 2016-08-17 NOTE — Assessment & Plan Note (Signed)
maxzide

## 2016-08-17 NOTE — Assessment & Plan Note (Signed)
Xanax prn  Potential benefits of a long term benzodiazepines  use as well as potential risks  and complications were explained to the patient and were aknowledged. 

## 2016-08-17 NOTE — Assessment & Plan Note (Signed)
On B12 

## 2016-08-19 ENCOUNTER — Telehealth: Payer: Self-pay | Admitting: Internal Medicine

## 2016-08-19 MED ORDER — ROSUVASTATIN CALCIUM 20 MG PO TABS: 20.0000 mg | ORAL_TABLET | Freq: Every day | ORAL | 11 refills | Status: DC

## 2016-08-19 NOTE — Telephone Encounter (Signed)
Would also like her labs put on My chart

## 2016-08-19 NOTE — Telephone Encounter (Signed)
Pt notified to restart Crestor, RX sent

## 2016-08-19 NOTE — Telephone Encounter (Signed)
Pt would like a call back regarding lab results, she stopped taking Crestor but states she will start taking them again.

## 2016-09-17 ENCOUNTER — Encounter: Payer: Self-pay | Admitting: Family Medicine

## 2016-09-17 ENCOUNTER — Ambulatory Visit (INDEPENDENT_AMBULATORY_CARE_PROVIDER_SITE_OTHER): Payer: BC Managed Care – PPO | Admitting: Family Medicine

## 2016-09-17 VITALS — BP 112/74 | HR 67 | Temp 98.2°F | Wt 171.5 lb

## 2016-09-17 DIAGNOSIS — W57XXXA Bitten or stung by nonvenomous insect and other nonvenomous arthropods, initial encounter: Secondary | ICD-10-CM | POA: Diagnosis not present

## 2016-09-17 DIAGNOSIS — S30860A Insect bite (nonvenomous) of lower back and pelvis, initial encounter: Secondary | ICD-10-CM | POA: Diagnosis not present

## 2016-09-17 DIAGNOSIS — J029 Acute pharyngitis, unspecified: Secondary | ICD-10-CM | POA: Diagnosis not present

## 2016-09-17 DIAGNOSIS — J020 Streptococcal pharyngitis: Secondary | ICD-10-CM | POA: Diagnosis not present

## 2016-09-17 LAB — POCT RAPID STREP A (OFFICE): Rapid Strep A Screen: POSITIVE — AB

## 2016-09-17 MED ORDER — PENICILLIN V POTASSIUM 500 MG PO TABS: 500.0000 mg | ORAL_TABLET | Freq: Two times a day (BID) | ORAL | 0 refills | Status: DC

## 2016-09-17 MED ORDER — DOXYCYCLINE HYCLATE 100 MG PO TABS: 100.0000 mg | ORAL_TABLET | Freq: Two times a day (BID) | ORAL | 0 refills | Status: DC

## 2016-09-17 MED ORDER — FLUCONAZOLE 150 MG PO TABS: 150.0000 mg | ORAL_TABLET | Freq: Once | ORAL | 0 refills | Status: AC

## 2016-09-17 NOTE — Assessment & Plan Note (Signed)
Patient with well healing tick bite on her back. No signs of localized infection. The symptoms the patient does report could potentially be related to a tick borne illness though could also potentially be related to strep throat. Given the tick bite and the possibility of symptoms being related to a tick borne illness such as [facility] spotted fever we will cover with doxycycline in addition to the penicillin. Discussed checking lab work today as outlined below though patient did not want to do this and stated she would get this done on Monday. Advised to follow-up with her PCP in about a week. She noted she would try to see him on Monday given that she has a B12 injection scheduled. She's given return precautions. Diflucan given per patient request given history of yeast infections with antibiotics.

## 2016-09-17 NOTE — Patient Instructions (Addendum)
Nice to meet you. The concern with a tick bite and your symtpoms is for tickborne illness. We will start you on doxycycline to cover for [facility] spotted fever.  You do appear to have a positive strep test. We will cover you for strep with penicillin.  If you begin to feel worse, developed fevers, nausea vomiting, or any new or changing symptoms please be reevaluated.

## 2016-09-17 NOTE — Progress Notes (Signed)
Tommi Rumps, MD Phone: [phone]  Vanessa [name] is a 56 y.o. female who presents today for same-day visit.  Patient reports 3 days of sore throat, fatigue, headaches, chills and sweats, and sinus congestion. She reports her left ear has been bothering her at times. She does report a week ago she was bitten by a tick in the middle of her back. Notes it has itched. She thinks it may have been on overnight. It was not engorged. She has been in contact with a sick 19-year-old who is her granddaughter who possibly had strep throat. She notes no numbness or weakness.  PMH: Smoker   ROS see history of present illness  Objective  Physical Exam Vitals:   09/17/16 1250  BP: 112/74  Pulse: 67  Temp: 98.2 F (36.8 C)    BP Readings from Last 3 Encounters:  09/17/16 112/74  08/17/16 128/86  03/25/16 130/80   Wt Readings from Last 3 Encounters:  09/17/16 171 lb 8 oz (77.8 kg)  08/17/16 173 lb (78.5 kg)  03/25/16 174 lb (78.9 kg)    Physical Exam  Constitutional: No distress.  HENT:  Head: Normocephalic and atraumatic.  Moderate oropharyngeal erythema, no exudate noted, no tonsillar swelling, normal TMs bilaterally  Eyes: Conjunctivae are normal. Pupils are equal, round, and reactive to light.  Neck: Neck supple.  Cardiovascular: Normal rate, regular rhythm and normal heart sounds.   Pulmonary/Chest: Effort normal and breath sounds normal.  Lymphadenopathy:    She has no cervical adenopathy.  Neurological: She is alert. Gait normal.  Skin: She is not diaphoretic.        Assessment/Plan: Please see individual problem list.  Strep throat Symptoms could be consistent with strep throat particularly in the setting of a positive rapid strep test. We'll treat with penicillin for this. She'll monitor her symptoms and if they worsen she'll be reevaluated.  Tick bite of back Patient with well healing tick bite on her back. No signs of localized infection. The symptoms the  patient does report could potentially be related to a tick borne illness though could also potentially be related to strep throat. Given the tick bite and the possibility of symptoms being related to a tick borne illness such as [facility] spotted fever we will cover with doxycycline in addition to the penicillin. Discussed checking lab work today as outlined below though patient did not want to do this and stated she would get this done on Monday. Advised to follow-up with her PCP in about a week. She noted she would try to see him on Monday given that she has a B12 injection scheduled. She's given return precautions. Diflucan given per patient request given history of yeast infections with antibiotics.   Orders Placed This Encounter  Procedures  . [facility] spotted fvr abs pnl(IgG+IgM)  . B. [place] Antibodies by WB  . POCT rapid strep A    Meds ordered this encounter  Medications  . doxycycline (VIBRA-TABS) 100 MG tablet    Sig: Take 1 tablet (100 mg total) by mouth 2 (two) times daily.    Dispense:  20 tablet    Refill:  0  . fluconazole (DIFLUCAN) 150 MG tablet    Sig: Take 1 tablet (150 mg total) by mouth once.    Dispense:  1 tablet    Refill:  0  . penicillin v potassium (VEETID) 500 MG tablet    Sig: Take 1 tablet (500 mg total) by mouth 2 (two) times daily.  Dispense:  20 tablet    Refill:  0   Tommi Rumps, MD [place]

## 2016-09-17 NOTE — Assessment & Plan Note (Signed)
Symptoms could be consistent with strep throat particularly in the setting of a positive rapid strep test. We'll treat with penicillin for this. She'll monitor her symptoms and if they worsen she'll be reevaluated.

## 2016-09-19 ENCOUNTER — Ambulatory Visit: Payer: BC Managed Care – PPO

## 2016-09-19 ENCOUNTER — Telehealth: Payer: Self-pay | Admitting: Internal Medicine

## 2016-09-19 NOTE — Telephone Encounter (Signed)
pls cont w/both abx Thx

## 2016-09-19 NOTE — Telephone Encounter (Signed)
Pt was seen at the Saturday clinic and was diagnosed with strep throat and was concerned with a tick bite. She was put on two different antibiotics. She feels like being on two antibiotics is a bit much. She wanted to know if it would be okay for her to stop taking one of them. Please advise.

## 2016-09-19 NOTE — Telephone Encounter (Signed)
Please advise 

## 2016-09-21 NOTE — Telephone Encounter (Signed)
LM notifying pt

## 2016-09-22 ENCOUNTER — Ambulatory Visit: Payer: BC Managed Care – PPO

## 2016-09-22 NOTE — Telephone Encounter (Signed)
-----   Message from Leone Haven, MD sent at 09/22/2016  8:24 AM EDT ----- Regarding: f/u Patient was seen in Saturday clinic. Please check with her to see if she is feeling better and if she ever went to get the lab work that was discussed. Thanks. Randall Hiss.   ----- Message ----- From: SYSTEM Sent: 09/22/2016  12:06 AM To: Leone Haven, MD

## 2016-09-22 NOTE — Telephone Encounter (Signed)
Patient states she is feeling much better and has not been able to have labs done. She has an appmt Monday for b12 injection and will have it done at that time

## 2016-09-26 ENCOUNTER — Ambulatory Visit (INDEPENDENT_AMBULATORY_CARE_PROVIDER_SITE_OTHER): Payer: BC Managed Care – PPO | Admitting: General Practice

## 2016-09-26 ENCOUNTER — Other Ambulatory Visit: Payer: BC Managed Care – PPO

## 2016-09-26 DIAGNOSIS — I519 Heart disease, unspecified: Principal | ICD-10-CM

## 2016-09-26 DIAGNOSIS — E538 Deficiency of other specified B group vitamins: Secondary | ICD-10-CM | POA: Diagnosis not present

## 2016-09-26 DIAGNOSIS — E039 Hypothyroidism, unspecified: Secondary | ICD-10-CM

## 2016-09-26 MED ORDER — CYANOCOBALAMIN 1000 MCG/ML IJ SOLN
1000.0000 ug | Freq: Once | INTRAMUSCULAR | Status: AC
  Administered 2016-09-26: 1000 ug via INTRAMUSCULAR

## 2016-09-27 LAB — ROCKY MTN SPOTTED FVR ABS PNL(IGG+IGM)
RMSF IGG: NOT DETECTED
RMSF IGM: NOT DETECTED

## 2016-10-03 LAB — LYME ABY, WSTRN BLT IGG & IGM W/BANDS
B BURGDORFERI IGM ABS (IB): NEGATIVE
B burgdorferi IgG Abs (IB): NEGATIVE
LYME DISEASE 28 KD IGG: NONREACTIVE
LYME DISEASE 30 KD IGG: NONREACTIVE
LYME DISEASE 41 KD IGG: NONREACTIVE
LYME DISEASE 45 KD IGG: NONREACTIVE
LYME DISEASE 66 KD IGG: NONREACTIVE
LYME DISEASE 93 KD IGG: NONREACTIVE
Lyme Disease 18 kD IgG: NONREACTIVE
Lyme Disease 23 kD IgG: NONREACTIVE
Lyme Disease 23 kD IgM: NONREACTIVE
Lyme Disease 39 kD IgG: NONREACTIVE
Lyme Disease 39 kD IgM: NONREACTIVE
Lyme Disease 41 kD IgM: REACTIVE — AB
Lyme Disease 58 kD IgG: NONREACTIVE

## 2016-10-19 NOTE — Progress Notes (Addendum)
I was available to supervise the injection. A. Angel Hobdy, MD  

## 2016-10-27 ENCOUNTER — Ambulatory Visit (INDEPENDENT_AMBULATORY_CARE_PROVIDER_SITE_OTHER): Payer: BC Managed Care – PPO

## 2016-10-27 DIAGNOSIS — E538 Deficiency of other specified B group vitamins: Secondary | ICD-10-CM

## 2016-10-27 MED ORDER — CYANOCOBALAMIN 1000 MCG/ML IJ SOLN
1000.0000 ug | Freq: Once | INTRAMUSCULAR | Status: AC
  Administered 2016-10-27: 1000 ug via INTRAMUSCULAR

## 2016-11-06 NOTE — Progress Notes (Signed)
I was available to supervise the injection. A. Maisen Schmit, MD  

## 2016-11-29 ENCOUNTER — Ambulatory Visit (INDEPENDENT_AMBULATORY_CARE_PROVIDER_SITE_OTHER): Payer: BC Managed Care – PPO | Admitting: General Practice

## 2016-11-29 DIAGNOSIS — E538 Deficiency of other specified B group vitamins: Secondary | ICD-10-CM | POA: Diagnosis not present

## 2016-11-29 MED ORDER — CYANOCOBALAMIN 1000 MCG/ML IJ SOLN
1000.0000 ug | Freq: Once | INTRAMUSCULAR | Status: AC
Start: 2016-11-29 — End: 2016-11-29
  Administered 2016-11-29: 1000 ug via INTRAMUSCULAR

## 2016-11-30 NOTE — Progress Notes (Signed)
Medical screening examination/treatment/procedure(s) were performed by non-physician practitioner and as supervising physician I was immediately available for consultation/collaboration. I agree with above. Kensleigh Gates, MD  

## 2016-12-21 ENCOUNTER — Ambulatory Visit: Payer: BC Managed Care – PPO | Admitting: Women's Health

## 2016-12-29 ENCOUNTER — Ambulatory Visit (INDEPENDENT_AMBULATORY_CARE_PROVIDER_SITE_OTHER): Payer: BC Managed Care – PPO

## 2016-12-29 DIAGNOSIS — E538 Deficiency of other specified B group vitamins: Secondary | ICD-10-CM | POA: Diagnosis not present

## 2016-12-29 MED ORDER — CYANOCOBALAMIN 1000 MCG/ML IJ SOLN
1000.0000 ug | Freq: Once | INTRAMUSCULAR | Status: AC
  Administered 2016-12-29: 1000 ug via INTRAMUSCULAR

## 2017-01-09 LAB — HM MAMMOGRAPHY

## 2017-01-25 ENCOUNTER — Encounter: Payer: Self-pay | Admitting: Internal Medicine

## 2017-01-30 ENCOUNTER — Ambulatory Visit (INDEPENDENT_AMBULATORY_CARE_PROVIDER_SITE_OTHER): Payer: BC Managed Care – PPO

## 2017-01-30 DIAGNOSIS — E538 Deficiency of other specified B group vitamins: Secondary | ICD-10-CM | POA: Diagnosis not present

## 2017-01-30 MED ORDER — CYANOCOBALAMIN 1000 MCG/ML IJ SOLN
1000.0000 ug | Freq: Once | INTRAMUSCULAR | Status: AC
  Administered 2017-01-30: 1000 ug via INTRAMUSCULAR

## 2017-02-14 ENCOUNTER — Telehealth: Payer: Self-pay | Admitting: Internal Medicine

## 2017-02-14 ENCOUNTER — Other Ambulatory Visit: Payer: Self-pay | Admitting: Internal Medicine

## 2017-02-14 MED ORDER — ALPRAZOLAM 0.25 MG PO TABS: 0.2500 mg | ORAL_TABLET | Freq: Two times a day (BID) | ORAL | 3 refills | Status: DC | PRN

## 2017-02-14 NOTE — Telephone Encounter (Addendum)
Vanessa [name]

## 2017-02-14 NOTE — Telephone Encounter (Signed)
Check [place] registry last filled 12/29/2016../lmb  

## 2017-02-14 NOTE — Telephone Encounter (Signed)
Pt called for a refill of her  ALPRAZolam (XANAX) 0.25 MG tablet Please advise

## 2017-02-14 NOTE — Addendum Note (Signed)
Addended by: Cassandria Anger on: 02/14/2017 01:12 PM   Modules accepted: Orders

## 2017-02-14 NOTE — Telephone Encounter (Signed)
Notified pt rx sent to [facility]../lmb 

## 2017-03-01 ENCOUNTER — Ambulatory Visit (INDEPENDENT_AMBULATORY_CARE_PROVIDER_SITE_OTHER): Payer: BC Managed Care – PPO

## 2017-03-01 DIAGNOSIS — E538 Deficiency of other specified B group vitamins: Secondary | ICD-10-CM

## 2017-03-01 MED ORDER — CYANOCOBALAMIN 1000 MCG/ML IJ SOLN
1000.0000 ug | Freq: Once | INTRAMUSCULAR | Status: AC
  Administered 2017-03-01: 1000 ug via INTRAMUSCULAR

## 2017-03-29 ENCOUNTER — Telehealth: Payer: Self-pay | Admitting: Internal Medicine

## 2017-03-29 MED ORDER — ALPRAZOLAM 0.25 MG PO TABS: 0.2500 mg | ORAL_TABLET | Freq: Two times a day (BID) | ORAL | 3 refills | Status: DC | PRN

## 2017-03-29 NOTE — Telephone Encounter (Signed)
Ok OV q 6 mo Thx

## 2017-03-29 NOTE — Telephone Encounter (Signed)
Copied from [place] [id number]. Topic: Quick Communication - Rx Refill/Question >> Mar 29, 2017  9:14 AM Lennox Solders wrote: Has the patient contacted their pharmacy?yes (Agent: If no, request that the patient contact the pharmacy for the refill.) pt needs a refill on alprazolam   Preferred Pharmacy (with phone number or street name):[facility] [address]  Agent: Please be advised that RX refills may take up to 3 business days. We ask that you follow-up with your pharmacy.

## 2017-03-29 NOTE — Telephone Encounter (Signed)
Check Vanessa [name] registry last filled 02/14/2017...Vanessa [name]

## 2017-04-03 ENCOUNTER — Ambulatory Visit (INDEPENDENT_AMBULATORY_CARE_PROVIDER_SITE_OTHER): Payer: BC Managed Care – PPO

## 2017-04-03 DIAGNOSIS — E538 Deficiency of other specified B group vitamins: Secondary | ICD-10-CM | POA: Diagnosis not present

## 2017-04-03 MED ORDER — CYANOCOBALAMIN 1000 MCG/ML IJ SOLN
1000.0000 ug | Freq: Once | INTRAMUSCULAR | Status: AC
  Administered 2017-04-03: 1000 ug via INTRAMUSCULAR

## 2017-04-12 ENCOUNTER — Ambulatory Visit: Payer: BC Managed Care – PPO | Admitting: Internal Medicine

## 2017-04-13 ENCOUNTER — Ambulatory Visit (INDEPENDENT_AMBULATORY_CARE_PROVIDER_SITE_OTHER): Payer: BC Managed Care – PPO | Admitting: Nurse Practitioner

## 2017-04-13 ENCOUNTER — Encounter: Payer: Self-pay | Admitting: Nurse Practitioner

## 2017-04-13 VITALS — BP 140/90 | HR 81 | Temp 98.1°F | Resp 16 | Ht 63.0 in | Wt 178.0 lb

## 2017-04-13 DIAGNOSIS — J209 Acute bronchitis, unspecified: Secondary | ICD-10-CM | POA: Diagnosis not present

## 2017-04-13 MED ORDER — AZITHROMYCIN 250 MG PO TABS: ORAL_TABLET | ORAL | 0 refills | Status: DC

## 2017-04-13 MED ORDER — HYDROCODONE-HOMATROPINE 5-1.5 MG/5ML PO SYRP: 5.0000 mL | ORAL_SOLUTION | Freq: Three times a day (TID) | ORAL | 0 refills | Status: DC | PRN

## 2017-04-13 NOTE — Patient Instructions (Addendum)
If you are not getting better by Saturday, begin the z-pak.  Stop smoking!!!!  For your cough, I have sent a prescription for hycodan. Please do not drink alcohol or operate machinery when you take this medication, it can cause drowsiness.  Please rest and stay hydrated.  It was nice to meet you. Thanks for letting me take care of you today :)  Upper Respiratory Infection, Adult Most upper respiratory infections (URIs) are caused by a virus. A URI affects the nose, throat, and upper air passages. The most common type of URI is often called "the common cold." Follow these instructions at home:  Take medicines only as told by your doctor.  Gargle warm saltwater or take cough drops to comfort your throat as told by your doctor.  Use a warm mist humidifier or inhale steam from a shower to increase air moisture. This may make it easier to breathe.  Drink enough fluid to keep your pee (urine) clear or pale yellow.  Eat soups and other clear broths.  Have a healthy diet.  Rest as needed.  Go back to work when your fever is gone or your doctor says it is okay. ? You may need to stay home longer to avoid giving your URI to others. ? You can also wear a face mask and wash your hands often to prevent spread of the virus.  Use your inhaler more if you have asthma.  Do not use any tobacco products, including cigarettes, chewing tobacco, or electronic cigarettes. If you need help quitting, ask your doctor. Contact a doctor if:  You are getting worse, not better.  Your symptoms are not helped by medicine.  You have chills.  You are getting more short of breath.  You have brown or red mucus.  You have yellow or brown discharge from your nose.  You have pain in your face, especially when you bend forward.  You have a fever.  You have puffy (swollen) neck glands.  You have pain while swallowing.  You have white areas in the back of your throat. Get help right away if:  You  have very bad or constant: ? Headache. ? Ear pain. ? Pain in your forehead, behind your eyes, and over your cheekbones (sinus pain). ? Chest pain.  You have long-lasting (chronic) lung disease and any of the following: ? Wheezing. ? Long-lasting cough. ? Coughing up blood. ? A change in your usual mucus.  You have a stiff neck.  You have changes in your: ? Vision. ? Hearing. ? Thinking. ? Mood. This information is not intended to replace advice given to you by your health care provider. Make sure you discuss any questions you have with your health care provider. Document Released: 08/31/2007 Document Revised: 11/15/2015 Document Reviewed: 06/19/2013 Elsevier Interactive Patient Education  2018 [employer].

## 2017-04-13 NOTE — Progress Notes (Signed)
Name: Vanessa [name]   MRN: [id number]    DOB: [birth year]   Date:04/13/2017       Progress Note  Subjective  Chief Complaint  Chief Complaint  Patient presents with  . Nasal Congestion    starting sunday had sore throat, sneezing, runny nose, moved to chest and has a cough, right ear pain     HPI Vanessa [name] presents today for an acute visit for cough and cold symptoms.  This is an acute problem. The symptoms began this past weekend-Sunday- with runny nose and watery eyes Then around Tuesday she began to have chest congestion, hoarseness, and "hacking" cough with yellow-brown sputum. She has continued to feel worse since onset She has tried vitamin C, zyrtec, muxcinex with minimal relief Her husband was treated with z-pak and inhaler for bronchitis last week and her granddaughter was ill with cold this week. She reports sweats, chills, malaise, wheezing, body aches. She denies shortess of breath. She is a smoker.  Patient Active Problem List   Diagnosis Date Noted  . Strep throat 09/17/2016  . Tick bite of back 09/17/2016  . Menopause 03/25/2016  . Essential hypertension 07/21/2015  . Vitamin D deficiency 07/21/2015  . Cough 06/28/2015  . Trigger thumb of left hand 11/20/2014  . Trigger thumb 04/23/2014  . Rash and nonspecific skin eruption 04/23/2014  . Abscess of skin of abdomen 04/09/2014  . Onychomycosis 04/09/2014  . Pain of right thumb 04/04/2014  . Smoker 06/26/2013  . Eye pain 11/09/2012  . Right-sided chest wall pain 09/11/2012  . Well adult exam 12/28/2010  . PSORIASIS 02/02/2010  . SINUSITIS, ACUTE 08/28/2009  . B12 deficiency 02/10/2009  . Anxiety state 08/11/2008  . DEPRESSION 08/11/2008  . Acute upper respiratory infection 06/03/2008  . INSOMNIA, PERSISTENT 02/14/2008  . PHARYNGITIS 02/14/2008  . FATIGUE 02/14/2008  . SWEATING 02/14/2008  . TOBACCO USE DISORDER/SMOKER-SMOKING CESSATION DISCUSSED 06/08/2007  . Hypothyroidism 03/07/2007  . Allergic  rhinitis 03/07/2007  . WEIGHT GAIN 03/07/2007  . ABNORMAL GLUCOSE NEC 03/07/2007  . Dyslipidemia 10/27/2006    Social History   Tobacco Use  . Smoking status: Current Every Day Smoker    Packs/day: 0.25    Types: Cigarettes  . Smokeless tobacco: Never Used  Substance Use Topics  . Alcohol use: No    Alcohol/week: 0.0 oz     Current Outpatient Medications:  .  ALPRAZolam (XANAX) 0.25 MG tablet, Take 1 tablet (0.25 mg total) by mouth 2 (two) times daily as needed. for anxiety, Disp: 60 tablet, Rfl: 3 .  Ascorbic Acid (VITAMIN C PO), Take by mouth., Disp: , Rfl:  .  aspirin 81 MG tablet, Take 81 mg by mouth daily.  , Disp: , Rfl:  .  cholecalciferol (VITAMIN D) 1000 units tablet, Take 1,000 Units by mouth daily., Disp: , Rfl:  .  doxycycline (VIBRA-TABS) 100 MG tablet, Take 1 tablet (100 mg total) by mouth 2 (two) times daily., Disp: 20 tablet, Rfl: 0 .  ibuprofen (ADVIL,MOTRIN) 600 MG tablet, Take 1 tablet (600 mg total) by mouth every 8 (eight) hours as needed for pain., Disp: 60 tablet, Rfl: 0 .  levothyroxine (SYNTHROID, LEVOTHROID) 88 MCG tablet, Take 1 tablet (88 mcg total) by mouth daily., Disp: 90 tablet, Rfl: 3 .  loratadine (CLARITIN) 10 MG tablet, Take 1 tablet (10 mg total) by mouth daily., Disp: 100 tablet, Rfl: 3 .  montelukast (SINGULAIR) 10 MG tablet, Take 1 tablet (10 mg total) by mouth daily., Disp: 90 tablet,  Rfl: 3 .  olopatadine (PATANOL) 0.1 % ophthalmic solution, Place 1 drop into both eyes 2 (two) times daily., Disp: 5 mL, Rfl: 3 .  penicillin v potassium (VEETID) 500 MG tablet, Take 1 tablet (500 mg total) by mouth 2 (two) times daily., Disp: 20 tablet, Rfl: 0 .  rosuvastatin (CRESTOR) 20 MG tablet, Take 1 tablet (20 mg total) by mouth daily., Disp: 30 tablet, Rfl: 11  Allergies  Allergen Reactions  . Atorvastatin     REACTION: aches  . Ezetimibe-Simvastatin     REACTION: hives    ROS See HPI  Objective  Vitals:   04/13/17 1013  BP: 140/90   Pulse: 81  Resp: 16  Temp: 98.1 F (36.7 C)  TempSrc: Oral  SpO2: 96%  Weight: 178 lb (80.7 kg)  Height: 5\' 3"  (1.6 m)    Body mass index is 31.53 kg/m.  Nursing Note and Vital Signs reviewed.  Physical Exam  Constitutional: Patient appears well-developed and well-nourished. No distress.  HEENT: head atraumatic, normocephalic, pupils equal and reactive to light, EOM's intact, TM's without erythema, with bulging bilaterally; neck supple without lymphadenopathy, oropharynx pink and moist without exudate Cardiovascular: Normal rate, regular rhythm, S1/S2 present.  No murmur or rub heard. No BLE edema. Pulmonary/Chest: Effort normal and breath sounds clear. No respiratory distress or retractions. Psychiatric: Patient has a normal mood and affect. behavior is normal. Judgment and thought content normal.   Assessment & Plan  There are no diagnoses linked to this encounter.  1. Acute bronchitis, unspecified organism She looks well today, afebrile; however this Is day 5 of her URI and she says that she is feeling progressively worse with symptoms now moving into her chest Z-pak sent with instructions to start on this Saturday if not improved, she agrees- we discussed that her symptoms are likely viral at this point and the need to avoid overusing antibiotics. We discussed home measures for URI as well as return precautions She was instructed to stop smoking. - HYDROcodone-homatropine (HYCODAN) 5-1.5 MG/5ML syrup; Take 5 mLs by mouth every 8 (eight) hours as needed for cough.  Dispense: 120 mL; Refill: 0 - azithromycin (ZITHROMAX) 250 MG tablet; Take 2 tablets on Day 1 then 1 tablet every day after.  Dispense: 6 tablet; Refill: 0

## 2017-05-04 ENCOUNTER — Ambulatory Visit (INDEPENDENT_AMBULATORY_CARE_PROVIDER_SITE_OTHER): Payer: BC Managed Care – PPO

## 2017-05-04 DIAGNOSIS — E538 Deficiency of other specified B group vitamins: Secondary | ICD-10-CM

## 2017-05-04 MED ORDER — CYANOCOBALAMIN 1000 MCG/ML IJ SOLN
1000.0000 ug | Freq: Once | INTRAMUSCULAR | Status: AC
  Administered 2017-05-04: 1000 ug via INTRAMUSCULAR

## 2017-05-10 ENCOUNTER — Other Ambulatory Visit: Payer: Self-pay | Admitting: Internal Medicine

## 2017-05-11 NOTE — Telephone Encounter (Signed)
Routing to dr plotnikov, please advise, thanks 

## 2017-06-01 ENCOUNTER — Ambulatory Visit: Payer: BC Managed Care – PPO

## 2017-06-05 ENCOUNTER — Ambulatory Visit: Payer: BC Managed Care – PPO

## 2017-06-06 ENCOUNTER — Ambulatory Visit (INDEPENDENT_AMBULATORY_CARE_PROVIDER_SITE_OTHER): Payer: BC Managed Care – PPO | Admitting: *Deleted

## 2017-06-06 DIAGNOSIS — E538 Deficiency of other specified B group vitamins: Secondary | ICD-10-CM | POA: Diagnosis not present

## 2017-06-06 MED ORDER — CYANOCOBALAMIN 1000 MCG/ML IJ SOLN
1000.0000 ug | Freq: Once | INTRAMUSCULAR | Status: AC
  Administered 2017-06-06: 1000 ug via INTRAMUSCULAR

## 2017-07-10 ENCOUNTER — Ambulatory Visit (INDEPENDENT_AMBULATORY_CARE_PROVIDER_SITE_OTHER): Payer: BC Managed Care – PPO

## 2017-07-10 DIAGNOSIS — E538 Deficiency of other specified B group vitamins: Secondary | ICD-10-CM

## 2017-07-10 MED ORDER — CYANOCOBALAMIN 1000 MCG/ML IJ SOLN
1000.0000 ug | Freq: Once | INTRAMUSCULAR | Status: AC
  Administered 2017-07-10: 1000 ug via INTRAMUSCULAR

## 2017-07-11 NOTE — Addendum Note (Signed)
Addended by: Ander Slade on: 07/11/2017 08:49 AM   Modules accepted: Level of Service

## 2017-08-09 ENCOUNTER — Ambulatory Visit: Payer: BC Managed Care – PPO | Admitting: Family

## 2017-08-09 ENCOUNTER — Encounter: Payer: Self-pay | Admitting: Family

## 2017-08-09 ENCOUNTER — Ambulatory Visit: Payer: BC Managed Care – PPO

## 2017-08-09 VITALS — BP 136/72 | HR 90 | Temp 98.1°F | Ht 63.0 in | Wt 176.0 lb

## 2017-08-09 DIAGNOSIS — E538 Deficiency of other specified B group vitamins: Secondary | ICD-10-CM | POA: Diagnosis not present

## 2017-08-09 DIAGNOSIS — H00014 Hordeolum externum left upper eyelid: Secondary | ICD-10-CM

## 2017-08-09 DIAGNOSIS — J209 Acute bronchitis, unspecified: Secondary | ICD-10-CM

## 2017-08-09 MED ORDER — TOBRAMYCIN 0.3 % OP SOLN: 1.0000 [drp] | Freq: Four times a day (QID) | OPHTHALMIC | 0 refills | Status: DC

## 2017-08-09 MED ORDER — FLUCONAZOLE 150 MG PO TABS: 150.0000 mg | ORAL_TABLET | Freq: Once | ORAL | 0 refills | Status: DC

## 2017-08-09 MED ORDER — FLUCONAZOLE 150 MG PO TABS: 150.0000 mg | ORAL_TABLET | Freq: Once | ORAL | 0 refills | Status: AC

## 2017-08-09 MED ORDER — AMOXICILLIN-POT CLAVULANATE 875-125 MG PO TABS: 1.0000 | ORAL_TABLET | Freq: Two times a day (BID) | ORAL | 0 refills | Status: DC

## 2017-08-09 MED ORDER — CYANOCOBALAMIN 1000 MCG/ML IJ SOLN
1000.0000 ug | Freq: Once | INTRAMUSCULAR | Status: AC
  Administered 2017-08-09: 1000 ug via INTRAMUSCULAR

## 2017-08-09 NOTE — Progress Notes (Signed)
Vanessa [name] is a 57 y.o. female with the following history as recorded in Epic[employer]:  Patient Active Problem List   Diagnosis Date Noted  . Strep throat 09/17/2016  . Tick bite of back 09/17/2016  . Menopause 03/25/2016  . Essential hypertension 07/21/2015  . Vitamin D deficiency 07/21/2015  . Cough 06/28/2015  . Trigger thumb of left hand 11/20/2014  . Trigger thumb 04/23/2014  . Rash and nonspecific skin eruption 04/23/2014  . Abscess of skin of abdomen 04/09/2014  . Onychomycosis 04/09/2014  . Pain of right thumb 04/04/2014  . Smoker 06/26/2013  . Eye pain 11/09/2012  . Right-sided chest wall pain 09/11/2012  . Well adult exam 12/28/2010  . PSORIASIS 02/02/2010  . SINUSITIS, ACUTE 08/28/2009  . B12 deficiency 02/10/2009  . Anxiety state 08/11/2008  . DEPRESSION 08/11/2008  . Acute upper respiratory infection 06/03/2008  . INSOMNIA, PERSISTENT 02/14/2008  . PHARYNGITIS 02/14/2008  . FATIGUE 02/14/2008  . SWEATING 02/14/2008  . TOBACCO USE DISORDER/SMOKER-SMOKING CESSATION DISCUSSED 06/08/2007  . Hypothyroidism 03/07/2007  . Allergic rhinitis 03/07/2007  . WEIGHT GAIN 03/07/2007  . ABNORMAL GLUCOSE NEC 03/07/2007  . Dyslipidemia 10/27/2006    Current Outpatient Medications  Medication Sig Dispense Refill  . ALPRAZolam (XANAX) 0.25 MG tablet TAKE 1 TABLET BY MOUTH TWICE DAILY AS NEEDED FOR ANXIETY 60 tablet 3  . Ascorbic Acid (VITAMIN C PO) Take by mouth.    Marland Kitchen aspirin 81 MG tablet Take 81 mg by mouth daily.      . cholecalciferol (VITAMIN D) 1000 units tablet Take 1,000 Units by mouth daily.    Marland Kitchen levothyroxine (SYNTHROID, LEVOTHROID) 88 MCG tablet Take 1 tablet (88 mcg total) by mouth daily. 90 tablet 3  . amoxicillin-clavulanate (AUGMENTIN) 875-125 MG tablet Take 1 tablet by mouth 2 (two) times daily. 20 tablet 0  . fluconazole (DIFLUCAN) 150 MG tablet Take 1 tablet (150 mg total) by mouth once for 1 dose. Repeat after 72 hours 2 tablet 0  . tobramycin (TOBREX) 0.3  % ophthalmic solution Place 1 drop into the left eye every 6 (six) hours. 5 mL 0   No current facility-administered medications for this visit.     Allergies: Atorvastatin; Crestor [rosuvastatin calcium]; and Ezetimibe-simvastatin  Past Medical History:  Diagnosis Date  . Allergy    rhinitis  . Anxiety   . B12 deficiency   . Cyst    gangeous- left hand  . Depression   . Hyperlipidemia   . Hypothyroidism   . Tobacco abuse   . Vitamin D deficiency     Past Surgical History:  Procedure Laterality Date  . CESAREAN SECTION  1992  . GANGLION CYST EXCISION  2009   right ankle    Family History  Problem Relation Age of Onset  . Thyroid disease Mother        Low  . Other Mother        B12 deficiency  . Heart disease Father 67       CAD  . Coronary artery disease Other   . Hyperlipidemia Other   . Colon cancer Neg Hx     Social History   Tobacco Use  . Smoking status: Current Every Day Smoker    Packs/day: 0.25    Types: Cigarettes  . Smokeless tobacco: Never Used  Substance Use Topics  . Alcohol use: No    Alcohol/week: 0.0 oz    Subjective:  Patient presents with concerns for possible bronchitis; started with allergy symptoms last week; felt  feverish yesterday; + fatigue; + smoker; + works with the public; notes that chest has felt tight in the past few days; Also concerned that she is developing a stye on her left eye- has noticed painful bump; (wearing make-up today); Needs B12 injection today;  Objective:  Vitals:   08/09/17 1525  BP: 136/72  Pulse: 90  Temp: 98.1 F (36.7 C)  TempSrc: Oral  SpO2: 95%  Weight: 176 lb 0.6 oz (79.9 kg)  Height: 5\' 3"  (1.6 m)    General: Well developed, well nourished, in no acute distress  Skin : Warm and dry.  Head: Normocephalic and atraumatic  Eyes: Sclera and conjunctiva clear; pupils round and reactive to light; extraocular movements intact  Ears: External normal; canals clear; tympanic membranes normal  Oropharynx:  Pink, supple. No suspicious lesions  Neck: Supple without thyromegaly, adenopathy  Lungs: Respirations unlabored; clear to auscultation bilaterally without wheeze, rales, rhonchi  CVS exam: normal rate and regular rhythm.  Abdomen: Soft; nontender; nondistended; normoactive bowel sounds; no masses or hepatosplenomegaly  Musculoskeletal: No deformities; no active joint inflammation  Extremities: No edema, cyanosis, clubbing  Vessels: Symmetric bilaterally  Neurologic: Alert and oriented; speech intact; face symmetrical; moves all extremities well; CNII-XII intact without focal deficit   Assessment:  1. Acute bronchitis, unspecified organism   2. Hordeolum externum of left upper eyelid   3. B12 deficiency     Plan:  1. Rx for Augmentin 875 mg bid x 10 days; sample of Dulera 200 2 puffs bid x 10 days; increase fluids, rest;  2. Apply warm compresses; trial of Tobramycin opht solution; follow-up with her eye doctor if symptoms persist; 3. B12 injection given;   No follow-ups on file.  No orders of the defined types were placed in this encounter.   Requested Prescriptions   Signed Prescriptions Disp Refills  . amoxicillin-clavulanate (AUGMENTIN) 875-125 MG tablet 20 tablet 0    Sig: Take 1 tablet by mouth 2 (two) times daily.  Marland Kitchen tobramycin (TOBREX) 0.3 % ophthalmic solution 5 mL 0    Sig: Place 1 drop into the left eye every 6 (six) hours.  . fluconazole (DIFLUCAN) 150 MG tablet 2 tablet 0    Sig: Take 1 tablet (150 mg total) by mouth once for 1 dose. Repeat after 72 hours

## 2017-08-24 ENCOUNTER — Telehealth: Payer: Self-pay | Admitting: Family

## 2017-08-24 MED ORDER — BENZONATATE 100 MG PO CAPS: 100.0000 mg | ORAL_CAPSULE | Freq: Three times a day (TID) | ORAL | 0 refills | Status: DC | PRN

## 2017-08-24 NOTE — Telephone Encounter (Signed)
Copied from [place] [id number]. Topic: Quick Communication - See Telephone Encounter >> Aug 24, 2017  9:04 AM Robina Ade, Helene Kelp D wrote: CRM for notification. See Telephone encounter for: 08/24/17. Patient called and said that she had a bad cough and would like something for it. Please call patient back if something is called in for her.

## 2017-08-24 NOTE — Telephone Encounter (Signed)
Pt states her symptoms are lingering, taking OTC Mucinex but it not helping

## 2017-08-24 NOTE — Telephone Encounter (Signed)
Will send in Tessalon perles; follow-up if no improvement by early next week.

## 2017-08-24 NOTE — Telephone Encounter (Signed)
Can she clarify what is going on with her cough? Is she better and just needs something for lingering symptoms? If no improvement at all, needs to be seen.

## 2017-08-24 NOTE — Telephone Encounter (Signed)
Please advise or does pt need to be seen?  Pt seen you on 08/09/17

## 2017-08-25 NOTE — Telephone Encounter (Signed)
Pt.notified

## 2017-08-26 ENCOUNTER — Encounter: Payer: Self-pay | Admitting: Family Medicine

## 2017-08-26 ENCOUNTER — Encounter: Payer: Self-pay | Admitting: *Deleted

## 2017-08-26 ENCOUNTER — Other Ambulatory Visit: Payer: Self-pay | Admitting: Internal Medicine

## 2017-08-26 ENCOUNTER — Ambulatory Visit (INDEPENDENT_AMBULATORY_CARE_PROVIDER_SITE_OTHER): Payer: BC Managed Care – PPO | Admitting: Family Medicine

## 2017-08-26 VITALS — BP 140/86 | HR 84 | Temp 98.3°F | Ht 63.0 in | Wt 180.8 lb

## 2017-08-26 DIAGNOSIS — J209 Acute bronchitis, unspecified: Secondary | ICD-10-CM | POA: Diagnosis not present

## 2017-08-26 MED ORDER — LEVOFLOXACIN 500 MG PO TABS: 500.0000 mg | ORAL_TABLET | Freq: Every day | ORAL | 0 refills | Status: AC

## 2017-08-26 MED ORDER — METHYLPREDNISOLONE 4 MG PO TBPK: ORAL_TABLET | ORAL | 0 refills | Status: DC

## 2017-08-26 MED ORDER — HYDROCODONE-HOMATROPINE 5-1.5 MG/5ML PO SYRP: 5.0000 mL | ORAL_SOLUTION | ORAL | 0 refills | Status: DC | PRN

## 2017-08-26 NOTE — Addendum Note (Signed)
Addended by: Alysia Penna A on: 08/26/2017 12:12 PM   Modules accepted: Orders

## 2017-08-26 NOTE — Progress Notes (Signed)
   Subjective:    Patient ID: Vanessa [name], female    DOB: 01-28-1961, 57 y.o.   MRN: [id number]  HPI Here for continued URI symptoms. She was seen on 15-15-19 for a sinusitis. She was given Augmentin and Dulera and Benzonate. The sinus symptoms improved but in the past few days she has developed chest tightness with a cough that produces yellow sputum. No fever.  Review of Systems  Constitutional: Negative.   HENT: Negative.   Eyes: Negative.   Respiratory: Positive for cough and chest tightness. Negative for shortness of breath and wheezing.   Cardiovascular: Negative.   Gastrointestinal: Positive for nausea and vomiting. Negative for abdominal distention, abdominal pain and diarrhea.       Objective:   Physical Exam  Constitutional: She appears well-developed and well-nourished.  HENT:  Right Ear: External ear normal.  Left Ear: External ear normal.  Nose: Nose normal.  Mouth/Throat: Oropharynx is clear and moist.  Eyes: Conjunctivae are normal.  Neck: No thyromegaly present.  Cardiovascular: Normal rate, regular rhythm, normal heart sounds and intact distal pulses.  Pulmonary/Chest: Effort normal. No respiratory distress. She has no wheezes. She has no rales.  Scattered rhonchi   Lymphadenopathy:    She has no cervical adenopathy.          Assessment & Plan:  Bronchitis, treat with Levaquin and a Medrol dose pack.  Alysia Penna, MD

## 2017-09-11 ENCOUNTER — Ambulatory Visit: Payer: BC Managed Care – PPO

## 2017-09-19 ENCOUNTER — Encounter: Payer: Self-pay | Admitting: Internal Medicine

## 2017-09-19 ENCOUNTER — Ambulatory Visit (INDEPENDENT_AMBULATORY_CARE_PROVIDER_SITE_OTHER): Payer: BC Managed Care – PPO | Admitting: Internal Medicine

## 2017-09-19 ENCOUNTER — Ambulatory Visit (INDEPENDENT_AMBULATORY_CARE_PROVIDER_SITE_OTHER)
Admission: RE | Admit: 2017-09-19 | Discharge: 2017-09-19 | Disposition: A | Discharge status: Home / Self Care | Payer: BC Managed Care – PPO | Source: Ambulatory Visit | Attending: Internal Medicine | Admitting: Internal Medicine

## 2017-09-19 VITALS — BP 132/86 | HR 83 | Temp 98.5°F | Ht 63.0 in | Wt 178.0 lb

## 2017-09-19 DIAGNOSIS — R05 Cough: Secondary | ICD-10-CM

## 2017-09-19 DIAGNOSIS — E559 Vitamin D deficiency, unspecified: Secondary | ICD-10-CM

## 2017-09-19 DIAGNOSIS — R059 Cough, unspecified: Secondary | ICD-10-CM

## 2017-09-19 DIAGNOSIS — E538 Deficiency of other specified B group vitamins: Secondary | ICD-10-CM | POA: Diagnosis not present

## 2017-09-19 DIAGNOSIS — Z0000 Encounter for general adult medical examination without abnormal findings: Secondary | ICD-10-CM | POA: Diagnosis not present

## 2017-09-19 MED ORDER — CYANOCOBALAMIN 1000 MCG/ML IJ SOLN
1000.0000 ug | Freq: Once | INTRAMUSCULAR | Status: AC
  Administered 2017-09-19: 1000 ug via INTRAMUSCULAR

## 2017-09-19 MED ORDER — ALPRAZOLAM 0.25 MG PO TABS: 0.2500 mg | ORAL_TABLET | Freq: Two times a day (BID) | ORAL | 3 refills | Status: DC | PRN

## 2017-09-19 MED ORDER — LEVOTHYROXINE SODIUM 88 MCG PO TABS
88.0000 ug | ORAL_TABLET | Freq: Every day | ORAL | 3 refills | Status: DC
Start: 2017-09-19 — End: 2018-09-26

## 2017-09-19 MED ORDER — FLUTICASONE FUROATE-VILANTEROL 100-25 MCG/INH IN AEPB: 1.0000 | INHALATION_SPRAY | Freq: Every day | RESPIRATORY_TRACT | 11 refills | Status: DC

## 2017-09-19 NOTE — Patient Instructions (Addendum)
Valerian root for insomnia   You can use over-the-counter  "cold" medicines  such as "Tylenol cold" , "Advil cold",  "Mucinex" or" Mucinex D"  for cough and congestion.   Avoid decongestants if you have high blood pressure and use "Afrin" nasal spray for nasal congestion as directed. Use " Delsym" or" Robitussin" cough syrup varietis for cough.  You can use plain "Tylenol" or "Advil" for fever, chills and achyness. Use Halls or Ricola cough drops.     Please, make an appointment if you are not better or if you're worse.

## 2017-09-19 NOTE — Progress Notes (Signed)
Subjective:  Patient ID: Vanessa [name], female    DOB: 02-04-1961  Age: 57 y.o. MRN: [id number]  CC: No chief complaint on file.   HPI AVERLEIGH SAVARY presents for URI x 1 mo - better. Smoking 4 cigs/d C/o raspy voice, ST, R earache Well exam  Outpatient Medications Prior to Visit  Medication Sig Dispense Refill  . ALPRAZolam (XANAX) 0.25 MG tablet TAKE 1 TABLET BY MOUTH TWICE DAILY AS NEEDED FOR ANXIETY 60 tablet 3  . Ascorbic Acid (VITAMIN C PO) Take by mouth.    Marland Kitchen aspirin 81 MG tablet Take 81 mg by mouth daily.      . cholecalciferol (VITAMIN D) 1000 units tablet Take 1,000 Units by mouth daily.    Marland Kitchen levothyroxine (SYNTHROID, LEVOTHROID) 88 MCG tablet TAKE 1 TABLET BY MOUTH ONCE DAILY 30 tablet 0  . benzonatate (TESSALON) 100 MG capsule Take 1 capsule (100 mg total) by mouth 3 (three) times daily as needed. 20 capsule 0  . HYDROcodone-homatropine (HYDROMET) 5-1.5 MG/5ML syrup Take 5 mLs by mouth every 4 (four) hours as needed. 240 mL 0  . methylPREDNISolone (MEDROL DOSEPAK) 4 MG TBPK tablet As directed 21 tablet 0  . tobramycin (TOBREX) 0.3 % ophthalmic solution Place 1 drop into the left eye every 6 (six) hours. 5 mL 0   No facility-administered medications prior to visit.     ROS: Review of Systems  Constitutional: Negative for activity change, appetite change, chills, fatigue and unexpected weight change.  HENT: Negative for congestion, mouth sores and sinus pressure.   Eyes: Negative for visual disturbance.  Respiratory: Positive for cough and wheezing. Negative for chest tightness.   Gastrointestinal: Negative for abdominal pain and nausea.  Genitourinary: Negative for difficulty urinating, frequency and vaginal pain.  Musculoskeletal: Negative for back pain and gait problem.  Skin: Negative for pallor and rash.  Neurological: Negative for dizziness, tremors, weakness, numbness and headaches.  Psychiatric/Behavioral: Negative for confusion and sleep disturbance.     Objective:  BP 132/86 (BP Location: Left Arm, Patient Position: Sitting, Cuff Size: Large)   Pulse 83   Temp 98.5 F (36.9 C) (Oral)   Ht 5\' 3"  (1.6 m)   Wt 178 lb (80.7 kg)   LMP 03/08/2006   SpO2 99%   BMI 31.53 kg/m   BP Readings from Last 3 Encounters:  09/19/17 132/86  08/26/17 140/86  08/09/17 136/72    Wt Readings from Last 3 Encounters:  09/19/17 178 lb (80.7 kg)  08/26/17 180 lb 12 oz (82 kg)  08/09/17 176 lb 0.6 oz (79.9 kg)    Physical Exam  Constitutional: She appears well-developed. No distress.  HENT:  Head: Normocephalic.  Right Ear: External ear normal.  Left Ear: External ear normal.  Nose: Nose normal.  Mouth/Throat: Oropharynx is clear and moist.  Eyes: Pupils are equal, round, and reactive to light. Conjunctivae are normal. Right eye exhibits no discharge. Left eye exhibits no discharge.  Neck: Normal range of motion. Neck supple. No JVD present. No tracheal deviation present. No thyromegaly present.  Cardiovascular: Normal rate, regular rhythm and normal heart sounds.  Pulmonary/Chest: No stridor. No respiratory distress. She has no wheezes.  Abdominal: Soft. Bowel sounds are normal. She exhibits no distension and no mass. There is no tenderness. There is no rebound and no guarding.  Musculoskeletal: She exhibits no edema or tenderness.  Lymphadenopathy:    She has no cervical adenopathy.  Neurological: She displays normal reflexes. No cranial nerve deficit. She exhibits  normal muscle tone. Coordination normal.  Skin: No rash noted. No erythema.  Psychiatric: She has a normal mood and affect. Her behavior is normal. Judgment and thought content normal.    Lab Results  Component Value Date   WBC 11.8 (H) 08/17/2016   HGB 14.0 08/17/2016   HCT 41.4 08/17/2016   PLT 347.0 08/17/2016   GLUCOSE 98 08/17/2016   CHOL 292 (H) 08/17/2016   TRIG 359.0 (H) 08/17/2016   HDL 32.60 (L) 08/17/2016   LDLDIRECT 209.0 08/17/2016   LDLCALC 248 (H)  07/21/2015   ALT 14 08/17/2016   AST 17 08/17/2016   NA 136 08/17/2016   K 4.1 08/17/2016   CL 103 08/17/2016   CREATININE 0.98 08/17/2016   BUN 21 08/17/2016   CO2 28 08/17/2016   TSH 1.09 08/17/2016   HGBA1C 5.7 07/21/2015    Dg Finger Thumb Right  Result Date: 04/04/2014 CLINICAL DATA:  Pain and swelling at the metacarpophalangeal joint. EXAM: RIGHT THUMB 2+V COMPARISON:  None. FINDINGS: There are subtle slight arthritic changes of the first metacarpal phalangeal joint as well as of the interphalangeal joint of the thumb. No acute osseous abnormality. No soft tissue calcifications. IMPRESSION: Slight arthritic changes as described. Electronically Signed   By: Rozetta Nunnery M.D.   On: 04/04/2014 10:19    Assessment & Plan:   There are no diagnoses linked to this encounter.   No orders of the defined types were placed in this encounter.    Follow-up: No follow-ups on file.  Walker Kehr, MD

## 2017-09-22 ENCOUNTER — Other Ambulatory Visit (INDEPENDENT_AMBULATORY_CARE_PROVIDER_SITE_OTHER): Payer: BC Managed Care – PPO

## 2017-09-22 DIAGNOSIS — Z0000 Encounter for general adult medical examination without abnormal findings: Secondary | ICD-10-CM | POA: Diagnosis not present

## 2017-09-22 DIAGNOSIS — E559 Vitamin D deficiency, unspecified: Secondary | ICD-10-CM

## 2017-09-22 LAB — VITAMIN D 25 HYDROXY (VIT D DEFICIENCY, FRACTURES): VITD: 34.07 ng/mL (ref 30.00–100.00)

## 2017-09-22 LAB — LIPID PANEL
CHOL/HDL RATIO: 7
Cholesterol: 284 mg/dL — ABNORMAL HIGH (ref 0–200)
HDL: 38.3 mg/dL — AB (ref >39.00)
LDL Cholesterol: 216 mg/dL — ABNORMAL HIGH (ref 0–99)
NONHDL: 245.79
TRIGLYCERIDES: 148 mg/dL (ref 0.0–149.0)
VLDL: 29.6 mg/dL (ref 0.0–40.0)

## 2017-09-22 LAB — HEPATIC FUNCTION PANEL
ALBUMIN: 4.3 g/dL (ref 3.5–5.2)
ALT: 17 U/L (ref 0–35)
AST: 20 U/L (ref 0–37)
Alkaline Phosphatase: 59 U/L (ref 39–117)
BILIRUBIN TOTAL: 0.5 mg/dL (ref 0.2–1.2)
Bilirubin, Direct: 0 mg/dL (ref 0.0–0.3)
Total Protein: 8 g/dL (ref 6.0–8.3)

## 2017-09-22 LAB — CBC WITH DIFFERENTIAL/PLATELET
Basophils Absolute: 0.1 10*3/uL (ref 0.0–0.1)
Basophils Relative: 1.3 % (ref 0.0–3.0)
EOS PCT: 6.3 % — AB (ref 0.0–5.0)
Eosinophils Absolute: 0.6 10*3/uL (ref 0.0–0.7)
HCT: 42.6 % (ref 36.0–46.0)
Hemoglobin: 14.7 g/dL (ref 12.0–15.0)
LYMPHS ABS: 2.4 10*3/uL (ref 0.7–4.0)
Lymphocytes Relative: 25.1 % (ref 12.0–46.0)
MCHC: 34.6 g/dL (ref 30.0–36.0)
MCV: 87.3 fl (ref 78.0–100.0)
MONOS PCT: 7.9 % (ref 3.0–12.0)
Monocytes Absolute: 0.8 10*3/uL (ref 0.1–1.0)
NEUTROS ABS: 5.7 10*3/uL (ref 1.4–7.7)
NEUTROS PCT: 59.4 % (ref 43.0–77.0)
PLATELETS: 343 10*3/uL (ref 150.0–400.0)
RBC: 4.87 Mil/uL (ref 3.87–5.11)
RDW: 14.9 % (ref 11.5–15.5)
WBC: 9.6 10*3/uL (ref 4.0–10.5)

## 2017-09-22 LAB — BASIC METABOLIC PANEL
BUN: 21 mg/dL (ref 6–23)
CALCIUM: 9.3 mg/dL (ref 8.4–10.5)
CO2: 26 meq/L (ref 19–32)
Chloride: 103 mEq/L (ref 96–112)
Creatinine, Ser: 0.93 mg/dL (ref 0.40–1.20)
GFR: 79.81 mL/min (ref >60.00)
GLUCOSE: 111 mg/dL — AB (ref 70–99)
POTASSIUM: 4.3 meq/L (ref 3.5–5.1)
SODIUM: 138 meq/L (ref 135–145)

## 2017-09-22 LAB — URINALYSIS, ROUTINE W REFLEX MICROSCOPIC
Bilirubin Urine: NEGATIVE
HGB URINE DIPSTICK: NEGATIVE
Ketones, ur: NEGATIVE
Leukocytes, UA: NEGATIVE
NITRITE: NEGATIVE
RBC / HPF: NONE SEEN (ref 0–?)
SPECIFIC GRAVITY, URINE: 1.02 (ref 1.000–1.030)
TOTAL PROTEIN, URINE-UPE24: 30 — AB
Urine Glucose: NEGATIVE
Urobilinogen, UA: 0.2 (ref 0.0–1.0)
pH: 6.5 (ref 5.0–8.0)

## 2017-09-22 LAB — TSH: TSH: 3.64 u[IU]/mL (ref 0.35–4.50)

## 2017-10-02 ENCOUNTER — Telehealth: Payer: Self-pay | Admitting: Internal Medicine

## 2017-10-02 NOTE — Telephone Encounter (Unsigned)
Copied from [place] [id number]. Topic: General - Other >> Oct 02, 2017 11:14 AM [name] wrote: Reason for CRM: Patient called and requested a eye drop be called in. She stated she was in on 09-19-17. The eye is just red and watery.   The pharmacy she requested [facility], [place] [phone] (Phone) [phone] (Fax)  Please advise

## 2017-10-03 NOTE — Telephone Encounter (Signed)
Please advise 

## 2017-10-04 MED ORDER — POLYMYXIN B-TRIMETHOPRIM 10000-0.1 UNIT/ML-% OP SOLN: OPHTHALMIC | 0 refills | Status: DC

## 2017-10-04 NOTE — Telephone Encounter (Signed)
Ok OV if not well Thx

## 2017-10-05 NOTE — Telephone Encounter (Signed)
LM notifying pt

## 2017-10-19 ENCOUNTER — Ambulatory Visit (INDEPENDENT_AMBULATORY_CARE_PROVIDER_SITE_OTHER): Payer: BC Managed Care – PPO

## 2017-10-19 DIAGNOSIS — E538 Deficiency of other specified B group vitamins: Secondary | ICD-10-CM

## 2017-10-19 MED ORDER — CYANOCOBALAMIN 1000 MCG/ML IJ SOLN
1000.0000 ug | Freq: Once | INTRAMUSCULAR | Status: AC
  Administered 2017-10-19: 1000 ug via INTRAMUSCULAR

## 2017-10-27 ENCOUNTER — Encounter: Payer: BC Managed Care – PPO | Admitting: Gynecology

## 2017-11-20 ENCOUNTER — Ambulatory Visit (INDEPENDENT_AMBULATORY_CARE_PROVIDER_SITE_OTHER): Payer: BC Managed Care – PPO

## 2017-11-20 DIAGNOSIS — E538 Deficiency of other specified B group vitamins: Secondary | ICD-10-CM

## 2017-11-20 MED ORDER — CYANOCOBALAMIN 1000 MCG/ML IJ SOLN
1000.0000 ug | Freq: Once | INTRAMUSCULAR | Status: AC
  Administered 2017-11-20: 1000 ug via INTRAMUSCULAR

## 2017-11-26 DIAGNOSIS — E559 Vitamin D deficiency, unspecified: Secondary | ICD-10-CM

## 2017-11-26 HISTORY — DX: Vitamin D deficiency, unspecified: E55.9

## 2017-12-06 NOTE — Progress Notes (Signed)
I personally reviewed the Note/Plan and I agree.  

## 2017-12-14 ENCOUNTER — Ambulatory Visit: Payer: BC Managed Care – PPO | Admitting: Family Medicine

## 2017-12-14 ENCOUNTER — Encounter: Payer: Self-pay | Admitting: Family Medicine

## 2017-12-14 VITALS — BP 132/74 | HR 66 | Ht 63.0 in | Wt 187.0 lb

## 2017-12-14 DIAGNOSIS — M25511 Pain in right shoulder: Secondary | ICD-10-CM | POA: Diagnosis not present

## 2017-12-14 NOTE — Patient Instructions (Signed)
Nice to meet you  Please try ice on the area  Please try the exercises  Please work on your posture  Please see me back in 3-4 weeks if your pain hasn't improved

## 2017-12-14 NOTE — Progress Notes (Signed)
Vanessa [name] - 57 y.o. female MRN [id number]  Date of birth: 1961/03/26  SUBJECTIVE:  Including CC & ROS.  Chief Complaint  Patient presents with  . Shoulder Pain    Vanessa [name] is a 57 y.o. female that is presenting with right shoulder pain. Pain increasing for five days. She woke up with pain. Localized at the anterior aspect of her right shoulder. Activity exacerbates the pain. Lifting her granddaughter triggers severe to mild pain. She has been applying ice and heat Denies tingling and numbones. Denies injury.     Review of Systems  Constitutional: Negative for fever.  HENT: Negative for congestion.   Respiratory: Negative for cough.   Cardiovascular: Negative for chest pain.  Gastrointestinal: Negative for abdominal pain.  Musculoskeletal: Negative for back pain.  Skin: Negative for color change.  Neurological: Negative for weakness.  Hematological: Negative for adenopathy.  Psychiatric/Behavioral: Negative for agitation.    HISTORY: Past Medical, Surgical, Social, and Family History Reviewed & Updated per EMR.   Pertinent Historical Findings include:  Past Medical History:  Diagnosis Date  . Allergy    rhinitis  . Anxiety   . B12 deficiency   . Cyst    gangeous- left hand  . Depression   . Hyperlipidemia   . Hypothyroidism   . Tobacco abuse   . Vitamin D deficiency     Past Surgical History:  Procedure Laterality Date  . CESAREAN SECTION  1992  . GANGLION CYST EXCISION  2009   right ankle    Allergies  Allergen Reactions  . Atorvastatin     REACTION: aches  . Crestor [Rosuvastatin Calcium]     achy  . Ezetimibe-Simvastatin     REACTION: hives    Family History  Problem Relation Age of Onset  . Thyroid disease Mother        Low  . Other Mother        B12 deficiency  . Heart disease Father 30       CAD  . Coronary artery disease Other   . Hyperlipidemia Other   . Colon cancer Neg Hx      Social History   Socioeconomic History  .  Marital status: Married    Spouse name: Not on file  . Number of children: Not on file  . Years of education: Not on file  . Highest education level: Not on file  Occupational History  . Not on file  Social Needs  . Financial resource strain: Not on file  . Food insecurity:    Worry: Not on file    Inability: Not on file  . Transportation needs:    Medical: Not on file    Non-medical: Not on file  Tobacco Use  . Smoking status: Current Every Day Smoker    Packs/day: 0.25    Types: Cigarettes  . Smokeless tobacco: Never Used  Substance and Sexual Activity  . Alcohol use: No    Alcohol/week: 0.0 standard drinks  . Drug use: No  . Sexual activity: Yes    Birth control/protection: Post-menopausal  Lifestyle  . Physical activity:    Days per week: Not on file    Minutes per session: Not on file  . Stress: Not on file  Relationships  . Social connections:    Talks on phone: Not on file    Gets together: Not on file    Attends religious service: Not on file    Active member of club or organization: Not  on file    Attends meetings of clubs or organizations: Not on file    Relationship status: Not on file  . Intimate partner violence:    Fear of current or ex partner: Not on file    Emotionally abused: Not on file    Physically abused: Not on file    Forced sexual activity: Not on file  Other Topics Concern  . Not on file  Social History Narrative  . Not on file     PHYSICAL EXAM:  VS: BP 132/74   Pulse 66   Ht 5\' 3"  (1.6 m)   Wt 187 lb (84.8 kg)   LMP 03/08/2006   SpO2 100%   BMI 33.13 kg/m  Physical Exam Gen: NAD, alert, cooperative with exam, well-appearing ENT: normal lips, normal nasal mucosa,  Eye: normal EOM, normal conjunctiva and lids CV:  no edema, +2 pedal pulses   Resp: no accessory muscle use, non-labored,  Skin: no rashes, no areas of induration  Neuro: normal tone, normal sensation to touch Psych:  normal insight, alert and oriented MSK:    Right shoulder: Inspection reveals no abnormalities, atrophy or asymmetry. Palpation is normal with no tenderness over AC joint or bicipital groove. ROM is full in all planes. Rotator cuff strength normal throughout. Pain with Hawkin's tests, empty can sign. Speeds  tests normal. Neurovascularly intact    Limited ultrasound: right shoulder:  Normal appearing BT  Normal subscap Findings suggestive of subacromial bursitis.   Summary: subacromial bursitis.   Ultrasound and interpretation by Vanessa Coots, MD      Aspiration/Injection Procedure Note Vanessa [name] Vanessa [name] [birth year]  Procedure: Injection Indications: right shoulder pain   Procedure Details Consent: Risks of procedure as well as the alternatives and risks of each were explained to the (patient/caregiver).  Consent for procedure obtained. Time Out: Verified patient identification, verified procedure, site/side was marked, verified correct patient position, special equipment/implants available, medications/allergies/relevent history reviewed, required imaging and test results available.  Performed.  The area was cleaned with iodine and alcohol swabs.    The right subacromial bursa was injected using 1 cc's of 40 mg Depomedrol and 4 cc's of 1% lidocaine with a 25 1 1/2" needle.  Ultrasound was used. Images were obtained in Long views showing the injection.    A sterile dressing was applied.  Patient did tolerate procedure well.       ASSESSMENT & PLAN:   Acute pain of right shoulder Appears to have a subacromial bursitis on ultrasound.  Likely related posture then repetitively picking up her granddaughter. -Injection today. -Counseled on home exercise therapy. -Counseled on supportive care. -If no improvement with referred to physical therapy.

## 2017-12-17 DIAGNOSIS — M25511 Pain in right shoulder: Secondary | ICD-10-CM | POA: Insufficient documentation

## 2017-12-17 NOTE — Assessment & Plan Note (Signed)
Appears to have a subacromial bursitis on ultrasound.  Likely related posture then repetitively picking up her granddaughter. -Injection today. -Counseled on home exercise therapy. -Counseled on supportive care. -If no improvement with referred to physical therapy.

## 2017-12-21 ENCOUNTER — Ambulatory Visit: Payer: BC Managed Care – PPO

## 2017-12-22 ENCOUNTER — Encounter: Payer: Self-pay | Admitting: Gynecology

## 2017-12-22 ENCOUNTER — Ambulatory Visit: Payer: BC Managed Care – PPO | Admitting: Gynecology

## 2017-12-22 VITALS — BP 124/80 | Ht 62.5 in | Wt 180.0 lb

## 2017-12-22 DIAGNOSIS — E559 Vitamin D deficiency, unspecified: Secondary | ICD-10-CM | POA: Diagnosis not present

## 2017-12-22 DIAGNOSIS — N951 Menopausal and female climacteric states: Secondary | ICD-10-CM | POA: Diagnosis not present

## 2017-12-22 DIAGNOSIS — Z01419 Encounter for gynecological examination (general) (routine) without abnormal findings: Secondary | ICD-10-CM | POA: Diagnosis not present

## 2017-12-22 DIAGNOSIS — N898 Other specified noninflammatory disorders of vagina: Secondary | ICD-10-CM | POA: Diagnosis not present

## 2017-12-22 LAB — WET PREP FOR TRICH, YEAST, CLUE

## 2017-12-22 MED ORDER — FLUCONAZOLE 150 MG PO TABS: 150.0000 mg | ORAL_TABLET | Freq: Once | ORAL | 0 refills | Status: AC

## 2017-12-22 NOTE — Progress Notes (Signed)
    Vanessa [name] IWLNLG Mar 23, 1961 [id number]        57 y.o.  G2P2 for annual gynecologic exam.  Former patient of Dr. Toney Rakes.  Having some hot flushes and sweats at night.  Past medical history,surgical history, problem list, medications, allergies, family history and social history were all reviewed and documented as reviewed in the EPIC chart.  ROS:  Performed with pertinent positives and negatives included in the history, assessment and plan.   Additional significant findings : None   Exam: Caryn Bee assistant Vitals:   12/22/17 0851  BP: 124/80  Weight: 180 lb (81.6 kg)  Height: 5' 2.5" (1.588 m)   Body mass index is 32.4 kg/m.  General appearance:  Normal affect, orientation and appearance. Skin: Grossly normal HEENT: Without gross lesions.  No cervical or supraclavicular adenopathy. Thyroid normal.  Lungs:  Clear without wheezing, rales or rhonchi Cardiac: RR, without RMG Abdominal:  Soft, nontender, without masses, guarding, rebound, organomegaly or hernia Breasts:  Examined lying and sitting without masses, retractions, discharge or axillary adenopathy. Pelvic:  Ext, BUS, Vagina: Normal  Cervix: Normal  Uterus: Anteverted, normal size, shape and contour, midline and mobile nontender   Adnexa: Without masses or tenderness    Anus and perineum: Normal   Rectovaginal: Normal sphincter tone without palpated masses or tenderness.    Assessment/Plan:  57 y.o. G2P2 female for annual gynecologic exam.   1. Postmenopausal.  Is having some hot flushes and sweats at night.  Options for management to include OTC products up to and including HRT discussed.  Patient wants to try OTC products.  We will follow-up if symptoms persist or worsen that she wants to discuss HRT.  No vaginal bleeding.  Call if any vaginal bleeding. 2. Pap smear 02/2016.  No Pap smear done today.  No history of abnormal Pap smears.  Plan repeat Pap smear next year at 3-year interval per current screening  guidelines. 3. Colonoscopy 2014.  Repeat at their recommended interval. 4. Mammography coming due in October and I reminded her to schedule this.  Breast exam normal today. 5. History of vitamin D deficiency.  Vitamin D level in the 20s last year.  Is on extra vitamin D daily.  Will check baseline vitamin D level today.  We will plan baseline DEXA further into the menopause at age 36. 62. Health maintenance.  No routine lab work drawn as patient does this elsewhere.  Follow-up 1 year, sooner as needed.   Anastasio Auerbach MD, 10:09 AM 12/22/2017

## 2017-12-22 NOTE — Patient Instructions (Signed)
Take the one Diflucan pill for the vaginal itching.  Call if symptoms persist.  Try Estrovin over-the-counter treatment for menopausal symptoms to see if this does not help.  Your vitamin D level will be available on MyChart  Follow-up in 1 year for annual exam, sooner if any issues.

## 2017-12-23 LAB — VITAMIN D 25 HYDROXY (VIT D DEFICIENCY, FRACTURES): Vit D, 25-Hydroxy: 21 ng/mL — ABNORMAL LOW (ref 30–100)

## 2017-12-25 ENCOUNTER — Other Ambulatory Visit: Payer: Self-pay | Admitting: Gynecology

## 2017-12-25 ENCOUNTER — Ambulatory Visit (INDEPENDENT_AMBULATORY_CARE_PROVIDER_SITE_OTHER): Payer: BC Managed Care – PPO | Admitting: Emergency Medicine

## 2017-12-25 ENCOUNTER — Encounter: Payer: Self-pay | Admitting: Gynecology

## 2017-12-25 DIAGNOSIS — E559 Vitamin D deficiency, unspecified: Secondary | ICD-10-CM

## 2017-12-25 DIAGNOSIS — E538 Deficiency of other specified B group vitamins: Secondary | ICD-10-CM

## 2017-12-25 MED ORDER — VITAMIN D (ERGOCALCIFEROL) 1.25 MG (50000 UNIT) PO CAPS: 50000.0000 [IU] | ORAL_CAPSULE | ORAL | 0 refills | Status: DC

## 2017-12-25 MED ORDER — CYANOCOBALAMIN 1000 MCG/ML IJ SOLN
1000.0000 ug | Freq: Once | INTRAMUSCULAR | Status: AC
  Administered 2017-12-25: 1000 ug via INTRAMUSCULAR

## 2018-01-15 LAB — HM MAMMOGRAPHY

## 2018-01-19 ENCOUNTER — Encounter: Payer: Self-pay | Admitting: Internal Medicine

## 2018-01-24 ENCOUNTER — Ambulatory Visit (INDEPENDENT_AMBULATORY_CARE_PROVIDER_SITE_OTHER): Payer: BC Managed Care – PPO | Admitting: *Deleted

## 2018-01-24 DIAGNOSIS — E538 Deficiency of other specified B group vitamins: Secondary | ICD-10-CM

## 2018-01-24 MED ORDER — CYANOCOBALAMIN 1000 MCG/ML IJ SOLN
1000.0000 ug | Freq: Once | INTRAMUSCULAR | Status: AC
  Administered 2018-01-24: 1000 ug via INTRAMUSCULAR

## 2018-02-02 ENCOUNTER — Encounter: Payer: Self-pay | Admitting: Internal Medicine

## 2018-02-02 ENCOUNTER — Ambulatory Visit: Payer: BC Managed Care – PPO | Admitting: Internal Medicine

## 2018-02-02 ENCOUNTER — Other Ambulatory Visit (INDEPENDENT_AMBULATORY_CARE_PROVIDER_SITE_OTHER): Payer: BC Managed Care – PPO

## 2018-02-02 DIAGNOSIS — I10 Essential (primary) hypertension: Secondary | ICD-10-CM

## 2018-02-02 DIAGNOSIS — R109 Unspecified abdominal pain: Secondary | ICD-10-CM | POA: Diagnosis not present

## 2018-02-02 LAB — URINALYSIS
Bilirubin Urine: NEGATIVE
KETONES UR: NEGATIVE
Leukocytes, UA: NEGATIVE
NITRITE: NEGATIVE
Specific Gravity, Urine: 1.025 (ref 1.000–1.030)
Urine Glucose: NEGATIVE
Urobilinogen, UA: 0.2 (ref 0.0–1.0)
pH: 5.5 (ref 5.0–8.0)

## 2018-02-02 MED ORDER — HYDROCODONE-ACETAMINOPHEN 7.5-325 MG PO TABS: 1.0000 | ORAL_TABLET | Freq: Four times a day (QID) | ORAL | 0 refills | Status: DC | PRN

## 2018-02-02 MED ORDER — IBUPROFEN 600 MG PO TABS: ORAL_TABLET | ORAL | 0 refills | Status: DC

## 2018-02-02 NOTE — Patient Instructions (Signed)
Call if worse Rice sock

## 2018-02-02 NOTE — Assessment & Plan Note (Signed)
?  MSK vs other UA Ibuprofen Norco prn w/caution

## 2018-02-02 NOTE — Progress Notes (Signed)
Subjective:  Patient ID: Vanessa [name], female    DOB: 1960-12-19  Age: 57 y.o. MRN: [id number]  CC: No chief complaint on file.   HPI Vanessa [name] presents for L back/flank, ribs since Sun 9/10 in intensity. Used ice. Today pain is -6/10. Took Advil It is worse w/ROM F/u HTN  Outpatient Medications Prior to Visit  Medication Sig Dispense Refill  . ALPRAZolam (XANAX) 0.25 MG tablet Take 1 tablet (0.25 mg total) by mouth 2 (two) times daily as needed. for anxiety 60 tablet 3  . Ascorbic Acid (VITAMIN C PO) Take by mouth.    Marland Kitchen aspirin 81 MG tablet Take 81 mg by mouth daily.      Marland Kitchen levothyroxine (SYNTHROID, LEVOTHROID) 88 MCG tablet Take 1 tablet (88 mcg total) by mouth daily. 90 tablet 3  . Vitamin D, Ergocalciferol, (DRISDOL) 50000 units CAPS capsule Take 1 capsule (50,000 Units total) by mouth every 7 (seven) days. 12 capsule 0   No facility-administered medications prior to visit.     ROS: Review of Systems  Constitutional: Negative for activity change, appetite change, chills, fatigue and unexpected weight change.  HENT: Negative for congestion, mouth sores and sinus pressure.   Eyes: Negative for visual disturbance.  Respiratory: Negative for cough and chest tightness.   Gastrointestinal: Negative for abdominal pain and nausea.  Genitourinary: Negative for difficulty urinating, frequency and vaginal pain.  Musculoskeletal: Positive for back pain. Negative for gait problem.  Skin: Negative for pallor and rash.  Neurological: Negative for dizziness, tremors, weakness, numbness and headaches.  Psychiatric/Behavioral: Negative for confusion and sleep disturbance.    Objective:  BP 134/84 (BP Location: Left Arm, Patient Position: Sitting, Cuff Size: Normal)   Pulse 71   Temp 98 F (36.7 C) (Oral)   Ht 5' 2.5" (1.588 m)   Wt 183 lb (83 kg)   LMP 03/08/2006   SpO2 97%   BMI 32.94 kg/m   BP Readings from Last 3 Encounters:  02/02/18 134/84  12/22/17 124/80    12/14/17 132/74    Wt Readings from Last 3 Encounters:  02/02/18 183 lb (83 kg)  12/22/17 180 lb (81.6 kg)  12/14/17 187 lb (84.8 kg)    Physical Exam  Constitutional: She appears well-developed. No distress.  HENT:  Head: Normocephalic.  Right Ear: External ear normal.  Left Ear: External ear normal.  Nose: Nose normal.  Mouth/Throat: Oropharynx is clear and moist.  Eyes: Pupils are equal, round, and reactive to light. Conjunctivae are normal. Right eye exhibits no discharge. Left eye exhibits no discharge.  Neck: Normal range of motion. Neck supple. No JVD present. No tracheal deviation present. No thyromegaly present.  Cardiovascular: Normal rate, regular rhythm and normal heart sounds.  Pulmonary/Chest: No stridor. No respiratory distress. She has no wheezes.  Abdominal: Soft. Bowel sounds are normal. She exhibits no distension and no mass. There is no tenderness. There is no rebound and no guarding.  Musculoskeletal: She exhibits tenderness. She exhibits no edema.  Lymphadenopathy:    She has no cervical adenopathy.  Neurological: She displays normal reflexes. No cranial nerve deficit. She exhibits normal muscle tone. Coordination normal.  Skin: No rash noted. No erythema.  Psychiatric: She has a normal mood and affect. Her behavior is normal. Judgment and thought content normal.  L flank is tender No rash  Lab Results  Component Value Date   WBC 9.6 09/22/2017   HGB 14.7 09/22/2017   HCT 42.6 09/22/2017   PLT 343.0 09/22/2017  GLUCOSE 111 (H) 09/22/2017   CHOL 284 (H) 09/22/2017   TRIG 148.0 09/22/2017   HDL 38.30 (L) 09/22/2017   LDLDIRECT 209.0 08/17/2016   LDLCALC 216 (H) 09/22/2017   ALT 17 09/22/2017   AST 20 09/22/2017   NA 138 09/22/2017   K 4.3 09/22/2017   CL 103 09/22/2017   CREATININE 0.93 09/22/2017   BUN 21 09/22/2017   CO2 26 09/22/2017   TSH 3.64 09/22/2017   HGBA1C 5.7 07/21/2015    Dg Chest 2 View  Result Date: 09/20/2017 CLINICAL  DATA:  One month of cough.  Current smoker. EXAM: CHEST - 2 VIEW COMPARISON:  None in PACs FINDINGS: The lungs are well-expanded. The interstitial markings are mildly prominent. There is no alveolar infiltrate or pleural effusion. The heart and pulmonary vascularity are normal. There is calcification in the wall of the aortic arch. The bony thorax exhibits no acute abnormality. IMPRESSION: Mild chronic bronchitic-smoking related changes. No alveolar pneumonia nor CHF. Thoracic aortic atherosclerosis. Electronically Signed   By: David  Martinique M.D.   On: 09/20/2017 09:45    Assessment & Plan:   There are no diagnoses linked to this encounter.   No orders of the defined types were placed in this encounter.    Follow-up: No follow-ups on file.  Walker Kehr, MD

## 2018-02-02 NOTE — Assessment & Plan Note (Signed)
[place]

## 2018-02-05 ENCOUNTER — Ambulatory Visit: Payer: BC Managed Care – PPO | Admitting: Internal Medicine

## 2018-02-09 ENCOUNTER — Ambulatory Visit: Payer: BC Managed Care – PPO | Admitting: Family Medicine

## 2018-02-26 ENCOUNTER — Ambulatory Visit (INDEPENDENT_AMBULATORY_CARE_PROVIDER_SITE_OTHER): Payer: BC Managed Care – PPO | Admitting: *Deleted

## 2018-02-26 DIAGNOSIS — E538 Deficiency of other specified B group vitamins: Secondary | ICD-10-CM

## 2018-02-26 MED ORDER — CYANOCOBALAMIN 1000 MCG/ML IJ SOLN
1000.0000 ug | Freq: Once | INTRAMUSCULAR | Status: AC
  Administered 2018-02-26: 1000 ug via INTRAMUSCULAR

## 2018-03-08 ENCOUNTER — Ambulatory Visit: Payer: BC Managed Care – PPO | Admitting: Family Medicine

## 2018-03-08 ENCOUNTER — Encounter: Payer: Self-pay | Admitting: Family Medicine

## 2018-03-08 ENCOUNTER — Encounter: Payer: Self-pay | Admitting: Emergency Medicine

## 2018-03-08 VITALS — BP 118/86 | HR 91 | Temp 98.2°F | Resp 16 | Wt 184.0 lb

## 2018-03-08 DIAGNOSIS — J0110 Acute frontal sinusitis, unspecified: Secondary | ICD-10-CM

## 2018-03-08 MED ORDER — AMOXICILLIN-POT CLAVULANATE 875-125 MG PO TABS: 1.0000 | ORAL_TABLET | Freq: Two times a day (BID) | ORAL | 0 refills | Status: DC

## 2018-03-08 NOTE — Progress Notes (Signed)
Patient ID: Vanessa [name], female   DOB: 26-Apr-1960, 57 y.o.   MRN: [id number]  PCP: Cassandria Anger, MD  Subjective:  Vanessa [name] is a 57 y.o. year old very pleasant female patient who presents with symptoms including nasal congestion, sinus pressure, tooth pain, and purulent nasal drainage, and post nasal drip. -started: 6  days ago, symptoms are worsening. Sinus pressure and congestion are the most bothersome per patient. -previous treatments: Zyrtec and flonase have provided limited benefit. -sick contacts/travel/risks: denies flu exposure. Recent exposure to sick contacts at work -Hx of: allergic rhinitis Influenza is not UTD No recent antibiotic use Current smoker stating she smokes at night approximately 3 cigarettes a day  ROS-denies fever, SOB, NVD, tooth pain  Pertinent Past Medical History- HTN  Medications- reviewed  Current Outpatient Medications  Medication Sig Dispense Refill  . ALPRAZolam (XANAX) 0.25 MG tablet Take 1 tablet (0.25 mg total) by mouth 2 (two) times daily as needed. for anxiety 60 tablet 3  . Ascorbic Acid (VITAMIN C PO) Take by mouth.    Marland Kitchen aspirin 81 MG tablet Take 81 mg by mouth daily.      Marland Kitchen HYDROcodone-acetaminophen (NORCO) 7.5-325 MG tablet Take 1 tablet by mouth every 6 (six) hours as needed for severe pain. 20 tablet 0  . ibuprofen (ADVIL,MOTRIN) 600 MG tablet Take twice a day x 2 weeks, then prn pain 60 tablet 0  . levothyroxine (SYNTHROID, LEVOTHROID) 88 MCG tablet Take 1 tablet (88 mcg total) by mouth daily. 90 tablet 3  . Vitamin D, Ergocalciferol, (DRISDOL) 50000 units CAPS capsule Take 1 capsule (50,000 Units total) by mouth every 7 (seven) days. 12 capsule 0   No current facility-administered medications for this visit.     Objective: BP 118/86   Pulse 91   Temp 98.2 F (36.8 C) (Oral)   Resp 16   Wt 184 lb (83.5 kg)   LMP 03/08/2006   SpO2 98%   BMI 33.12 kg/m  Gen: NAD, resting comfortably HEENT: Turbinates mildly  erythematous, TMs normal bilaterally , oropharynx mildly erythematous with no  exudate or edema, frontal sinus tenderness present CV: RRR no murmurs rubs or gallops Lungs: CTAB no crackles, wheeze, rhonchi Abdomen: soft/nontender/nondistended/normal bowel sounds. No rebound or guarding.  Ext: no edema Skin: warm, dry, no rash Neuro: grossly normal, moves all extremities  Assessment/Plan: 1. Acute non-recurrent frontal sinusitis Exam and history support empiric treatment for sinusitis. Offered prednisone to improve sinus pressure and patient politely declined today.  Advised patient on supportive measures:  Get rest, drink plenty of fluids, and use tylenol or ibuprofen as needed for pain. Also advised use of nasal saline rinses and she will continue flonase. Follow up if fever >101, if symptoms worsen or if symptoms are not improving in 3-4 days. Patient  verbalizes understanding and agrees with plan.  - amoxicillin-clavulanate (AUGMENTIN) 875-125 MG tablet; Take 1 tablet by mouth 2 (two) times daily.  Dispense: 20 tablet; Refill: 0   Finally, we reviewed reasons to return to care including if symptoms worsen or persist or new concerns arise- once again particularly shortness of breath or fever.   Laurita Quint, FNP

## 2018-03-08 NOTE — Patient Instructions (Signed)
It was a pleasure to meet you today.   Please take medication as directed with food.   You can also use nasal saline rinses for symptoms and flonase can be continued.  Your symptoms are most likely related to a viral illness. Please drink plenty of water so that your urine is pale yellow or clear. Also, get plenty of rest, use tylenol or ibuprofen as needed for discomfort and follow up if symptoms do not improve in 3 to 4 days, worsen, or you develop a fever >101.   Sinusitis, Adult Sinusitis is soreness and inflammation of your sinuses. Sinuses are hollow spaces in the bones around your face. They are located:  Around your eyes.  In the middle of your forehead.  Behind your nose.  In your cheekbones.  Your sinuses and nasal passages are lined with a stringy fluid (mucus). Mucus normally drains out of your sinuses. When your nasal tissues get inflamed or swollen, the mucus can get trapped or blocked so air cannot flow through your sinuses. This lets bacteria, viruses, and funguses grow, and that leads to infection. Follow these instructions at home: Medicines  Take, use, or apply over-the-counter and prescription medicines only as told by your doctor. These may include nasal sprays.  If you were prescribed an antibiotic medicine, take it as told by your doctor. Do not stop taking the antibiotic even if you start to feel better. Hydrate and Humidify  Drink enough water to keep your pee (urine) clear or pale yellow.  Use a cool mist humidifier to keep the humidity level in your home above 50%.  Breathe in steam for 10-15 minutes, 3-4 times a day or as told by your doctor. You can do this in the bathroom while a hot shower is running.  Try not to spend time in cool or dry air. Rest  Rest as much as possible.  Sleep with your head raised (elevated).  Make sure to get enough sleep each night. General instructions  Put a warm, moist washcloth on your face 3-4 times a day or as  told by your doctor. This will help with discomfort.  Wash your hands often with soap and water. If there is no soap and water, use hand sanitizer.  Do not smoke. Avoid being around people who are smoking (secondhand smoke).  Keep all follow-up visits as told by your doctor. This is important. Contact a doctor if:  You have a fever.  Your symptoms get worse.  Your symptoms do not get better within 10 days. Get help right away if:  You have a very bad headache.  You cannot stop throwing up (vomiting).  You have pain or swelling around your face or eyes.  You have trouble seeing.  You feel confused.  Your neck is stiff.  You have trouble breathing. This information is not intended to replace advice given to you by your health care provider. Make sure you discuss any questions you have with your health care provider. Document Released: 08/31/2007 Document Revised: 11/08/2015 Document Reviewed: 01/07/2015 Elsevier Interactive Patient Education  [employer].

## 2018-03-29 ENCOUNTER — Ambulatory Visit (INDEPENDENT_AMBULATORY_CARE_PROVIDER_SITE_OTHER): Payer: BC Managed Care – PPO | Admitting: *Deleted

## 2018-03-29 DIAGNOSIS — E538 Deficiency of other specified B group vitamins: Secondary | ICD-10-CM

## 2018-03-29 MED ORDER — CYANOCOBALAMIN 1000 MCG/ML IJ SOLN
1000.0000 ug | Freq: Once | INTRAMUSCULAR | Status: AC
  Administered 2018-03-29: 1000 ug via INTRAMUSCULAR

## 2018-04-05 ENCOUNTER — Other Ambulatory Visit: Payer: BC Managed Care – PPO

## 2018-04-13 ENCOUNTER — Other Ambulatory Visit: Payer: BC Managed Care – PPO

## 2018-04-16 ENCOUNTER — Other Ambulatory Visit: Payer: Self-pay | Admitting: Internal Medicine

## 2018-04-17 ENCOUNTER — Other Ambulatory Visit: Payer: BC Managed Care – PPO

## 2018-04-24 ENCOUNTER — Other Ambulatory Visit: Payer: Self-pay | Admitting: Internal Medicine

## 2018-04-24 ENCOUNTER — Encounter: Payer: Self-pay | Admitting: Internal Medicine

## 2018-04-24 ENCOUNTER — Ambulatory Visit: Payer: BC Managed Care – PPO | Admitting: Internal Medicine

## 2018-04-24 VITALS — BP 124/84 | HR 88 | Temp 98.3°F | Wt 182.0 lb

## 2018-04-24 DIAGNOSIS — R51 Headache: Secondary | ICD-10-CM | POA: Diagnosis not present

## 2018-04-24 DIAGNOSIS — J069 Acute upper respiratory infection, unspecified: Secondary | ICD-10-CM

## 2018-04-24 DIAGNOSIS — R52 Pain, unspecified: Secondary | ICD-10-CM | POA: Diagnosis not present

## 2018-04-24 DIAGNOSIS — J029 Acute pharyngitis, unspecified: Secondary | ICD-10-CM

## 2018-04-24 DIAGNOSIS — B9789 Other viral agents as the cause of diseases classified elsewhere: Secondary | ICD-10-CM

## 2018-04-24 DIAGNOSIS — R519 Headache, unspecified: Secondary | ICD-10-CM

## 2018-04-24 DIAGNOSIS — H938X3 Other specified disorders of ear, bilateral: Secondary | ICD-10-CM | POA: Diagnosis not present

## 2018-04-24 LAB — POC INFLUENZA A&B (BINAX/QUICKVUE)
Influenza A, POC: NEGATIVE
Influenza B, POC: NEGATIVE

## 2018-04-24 LAB — POCT RAPID STREP A (OFFICE): Rapid Strep A Screen: NEGATIVE

## 2018-04-24 MED ORDER — METHYLPREDNISOLONE ACETATE 80 MG/ML IJ SUSP
80.0000 mg | Freq: Once | INTRAMUSCULAR | Status: AC
  Administered 2018-04-24: 80 mg via INTRAMUSCULAR

## 2018-04-24 MED ORDER — HYDROCODONE-HOMATROPINE 5-1.5 MG/5ML PO SYRP: 5.0000 mL | ORAL_SOLUTION | Freq: Three times a day (TID) | ORAL | 0 refills | Status: DC | PRN

## 2018-04-24 NOTE — Patient Instructions (Signed)
Viral Respiratory Infection  A viral respiratory infection is an illness that affects parts of the body that are used for breathing. These include the lungs, nose, and throat. It is caused by a germ called a virus.  Some examples of this kind of infection are:  · A cold.  · The flu (influenza).  · A respiratory syncytial virus (RSV) infection.  A person who gets this illness may have the following symptoms:  · A stuffy or runny nose.  · Yellow or green fluid in the nose.  · A cough.  · Sneezing.  · Tiredness (fatigue).  · Achy muscles.  · A sore throat.  · Sweating or chills.  · A fever.  · A headache.  Follow these instructions at home:  Managing pain and congestion  · Take over-the-counter and prescription medicines only as told by your doctor.  · If you have a sore throat, gargle with salt water. Do this 3-4 times per day or as needed. To make a salt-water mixture, dissolve ½-1 tsp of salt in 1 cup of warm water. Make sure that all the salt dissolves.  · Use nose drops made from salt water. This helps with stuffiness (congestion). It also helps soften the skin around your nose.  · Drink enough fluid to keep your pee (urine) pale yellow.  General instructions    · Rest as much as possible.  · Do not drink alcohol.  · Do not use any products that have nicotine or tobacco, such as cigarettes and e-cigarettes. If you need help quitting, ask your doctor.  · Keep all follow-up visits as told by your doctor. This is important.  How is this prevented?    · Get a flu shot every year. Ask your doctor when you should get your flu shot.  · Do not let other people get your germs. If you are sick:  ? Stay home from work or school.  ? Wash your hands with soap and water often. Wash your hands after you cough or sneeze. If soap and water are not available, use hand sanitizer.  · Avoid contact with people who are sick during cold and flu season. This is in fall and winter.  Get help if:  · Your symptoms last for 10 days or  longer.  · Your symptoms get worse over time.  · You have a fever.  · You have very bad pain in your face or forehead.  · Parts of your jaw or neck become very swollen.  Get help right away if:  · You feel pain or pressure in your chest.  · You have shortness of breath.  · You faint or feel like you will faint.  · You keep throwing up (vomiting).  · You feel confused.  Summary  · A viral respiratory infection is an illness that affects parts of the body that are used for breathing.  · Examples of this illness include a cold, the flu, and respiratory syncytial virus (RSV) infection.  · The infection can cause a runny nose, cough, sneezing, sore throat, and fever.  · Follow what your doctor tells you about taking medicines, drinking lots of fluid, washing your hands, resting at home, and avoiding people who are sick.  This information is not intended to replace advice given to you by your health care provider. Make sure you discuss any questions you have with your health care provider.  Document Released: 02/25/2008 Document Revised: 04/24/2017 Document Reviewed: 04/24/2017  Elsevier   Interactive Patient Education © 2019 Elsevier Inc.

## 2018-04-24 NOTE — Addendum Note (Signed)
Addended by: Lurlean Nanny on: 04/24/2018 12:50 PM   Modules accepted: Orders

## 2018-04-24 NOTE — Progress Notes (Signed)
HPI  Pt presents to the clinic today with c/o headache, ear fullness, sore throat and cough. She reports this started yesterday. The headache is located in the forehead. She describes the pain as pressure. She denies ear pain or drainage. She denies difficulty swallowing but reports the sore throat is more of an itch or tickle. The cough is nonproductive. She reports she coughs so much, she vomits. She denies fever, chills but has had body aches. She has tried Zyrtec, Mucinex, Robitussin and Ibuprofen with minimal relief. She has had sick contacts dx with pneumonia. She has a history of allergies. She did not get her flu shot.  Review of Systems      Past Medical History:  Diagnosis Date  . Allergy    rhinitis  . Anxiety   . B12 deficiency   . Cyst    gangeous- left hand  . Depression   . Hyperlipidemia   . Hypothyroidism   . Tobacco abuse   . Vitamin D deficiency 11/2017   Value 21    Family History  Problem Relation Age of Onset  . Thyroid disease Mother        Low  . Other Mother        B12 deficiency  . Heart disease Father 84       CAD  . Coronary artery disease Other   . Hyperlipidemia Other   . Colon cancer Neg Hx     Social History   Socioeconomic History  . Marital status: Married    Spouse name: Not on file  . Number of children: Not on file  . Years of education: Not on file  . Highest education level: Not on file  Occupational History  . Not on file  Social Needs  . Financial resource strain: Not on file  . Food insecurity:    Worry: Not on file    Inability: Not on file  . Transportation needs:    Medical: Not on file    Non-medical: Not on file  Tobacco Use  . Smoking status: Current Every Day Smoker    Packs/day: 0.25    Types: Cigarettes  . Smokeless tobacco: Never Used  Substance and Sexual Activity  . Alcohol use: Yes    Alcohol/week: 0.0 standard drinks    Comment: Rare  . Drug use: No  . Sexual activity: Yes    Birth  control/protection: Post-menopausal    Comment: 1st intercourse 58 yo-Fewer than 5 partners  Lifestyle  . Physical activity:    Days per week: Not on file    Minutes per session: Not on file  . Stress: Not on file  Relationships  . Social connections:    Talks on phone: Not on file    Gets together: Not on file    Attends religious service: Not on file    Active member of club or organization: Not on file    Attends meetings of clubs or organizations: Not on file    Relationship status: Not on file  . Intimate partner violence:    Fear of current or ex partner: Not on file    Emotionally abused: Not on file    Physically abused: Not on file    Forced sexual activity: Not on file  Other Topics Concern  . Not on file  Social History Narrative  . Not on file    Allergies  Allergen Reactions  . Atorvastatin     REACTION: aches  . Crestor [Rosuvastatin Calcium]  achy  . Ezetimibe-Simvastatin     REACTION: hives     Constitutional: Positive headache, body aches. Denies fatigue, fever or abrupt weight changes.  HEENT:  Positive ear fullness, sore throat. Denies eye redness, eye pain, pressure behind the eyes, facial pain, nasal congestion, ear pain, ringing in the ears, wax buildup, runny nose or bloody nose. Respiratory: Positive cough. Denies difficulty breathing or shortness of breath.  Cardiovascular: Denies chest pain, chest tightness, palpitations or swelling in the hands or feet.  GI: Pt reports vomiting. Denies nausea, diarrhea, constipation or blood in her stool.  No other specific complaints in a complete review of systems (except as listed in HPI above).  Objective:   BP 124/84   Pulse 88   Temp 98.3 F (36.8 C) (Oral)   Wt 182 lb (82.6 kg)   LMP 03/08/2006   SpO2 98%   BMI 32.76 kg/m  Wt Readings from Last 3 Encounters:  04/24/18 182 lb (82.6 kg)  03/08/18 184 lb (83.5 kg)  02/02/18 183 lb (83 kg)     General: Appears her stated age, in  NAD. HEENT: Head: normal shape and size, mild maxillary sinus tenderness noted; Ears: Tm's gray and intact, normal light reflex; Nose: mucosa pink and moist, septum midline; Throat/Mouth: + PND. Teeth present, mucosa erythematous and moist, tonsils 2+ without exudate noted, no lesions or ulcerations noted.  Neck: No cervical lymphadenopathy.  Cardiovascular: Normal rate and rhythm. S1,S2 noted.  No murmur, rubs or gallops noted.  Pulmonary/Chest: Normal effort and positive vesicular breath sounds. No respiratory distress. No wheezes, rales or ronchi noted.       Assessment & Plan:   Headache, Ear Fullness, Sore Throat, Cough, Body Aches:  RST: negative Rapid Flu: negative Get some rest and drink plenty of water Do salt water gargles for the sore throat 80 mg Depo IM today Rx for Hycodan cough syrup  RTC as needed or if symptoms persist.   Webb Silversmith, NP

## 2018-04-25 ENCOUNTER — Ambulatory Visit: Payer: BC Managed Care – PPO | Admitting: Internal Medicine

## 2018-04-27 ENCOUNTER — Ambulatory Visit: Payer: BC Managed Care – PPO | Admitting: Nurse Practitioner

## 2018-04-27 ENCOUNTER — Encounter: Payer: Self-pay | Admitting: Nurse Practitioner

## 2018-04-27 VITALS — BP 120/85 | HR 95 | Temp 98.3°F | Ht 62.5 in | Wt 183.0 lb

## 2018-04-27 DIAGNOSIS — E538 Deficiency of other specified B group vitamins: Secondary | ICD-10-CM | POA: Diagnosis not present

## 2018-04-27 DIAGNOSIS — J40 Bronchitis, not specified as acute or chronic: Secondary | ICD-10-CM

## 2018-04-27 DIAGNOSIS — J069 Acute upper respiratory infection, unspecified: Secondary | ICD-10-CM

## 2018-04-27 MED ORDER — CYANOCOBALAMIN 1000 MCG/ML IJ SOLN
1000.0000 ug | Freq: Once | INTRAMUSCULAR | Status: AC
  Administered 2018-04-27: 1000 ug via INTRAMUSCULAR

## 2018-04-27 MED ORDER — BENZONATATE 100 MG PO CAPS: 100.0000 mg | ORAL_CAPSULE | Freq: Two times a day (BID) | ORAL | 0 refills | Status: DC | PRN

## 2018-04-27 MED ORDER — DOXYCYCLINE HYCLATE 100 MG PO TABS: 100.0000 mg | ORAL_TABLET | Freq: Two times a day (BID) | ORAL | 0 refills | Status: DC

## 2018-04-27 NOTE — Patient Instructions (Addendum)
Take doxycyline as directed  Tessalon as needed for cough  Please follow up for fevers over 101, if your symptoms get worse, or if your symptoms dont improve with the antibiotic.   Acute Bronchitis, Adult Acute bronchitis is when air tubes (bronchi) in the lungs suddenly get swollen. The condition can make it hard to breathe. It can also cause these symptoms:  A cough.  Coughing up clear, yellow, or green mucus.  Wheezing.  Chest congestion.  Shortness of breath.  A fever.  Body aches.  Chills.  A sore throat. Follow these instructions at home:  Medicines  Take over-the-counter and prescription medicines only as told by your doctor.  If you were prescribed an antibiotic medicine, take it as told by your doctor. Do not stop taking the antibiotic even if you start to feel better. General instructions  Rest.  Drink enough fluids to keep your pee (urine) pale yellow.  Avoid smoking and secondhand smoke. If you smoke and you need help quitting, ask your doctor. Quitting will help your lungs heal faster.  Use an inhaler, cool mist vaporizer, or humidifier as told by your doctor.  Keep all follow-up visits as told by your doctor. This is important. How is this prevented? To lower your risk of getting this condition again:  Wash your hands often with soap and water. If you cannot use soap and water, use hand sanitizer.  Avoid contact with people who have cold symptoms.  Try not to touch your hands to your mouth, nose, or eyes.  Make sure to get the flu shot every year. Contact a doctor if:  Your symptoms do not get better in 2 weeks. Get help right away if:  You cough up blood.  You have chest pain.  You have very bad shortness of breath.  You become dehydrated.  You faint (pass out) or keep feeling like you are going to pass out.  You keep throwing up (vomiting).  You have a very bad headache.  Your fever or chills gets worse. This information is  not intended to replace advice given to you by your health care provider. Make sure you discuss any questions you have with your health care provider. Document Released: 08/31/2007 Document Revised: 10/26/2016 Document Reviewed: 09/02/2015 Elsevier Interactive Patient Education  2019 [employer].

## 2018-04-27 NOTE — Progress Notes (Signed)
Vanessa [name] is a 58 y.o. female with the following history as recorded in Epic[facility]:  Patient Active Problem List   Diagnosis Date Noted  . Flank pain 02/02/2018  . Acute pain of right shoulder 12/17/2017  . Strep throat 09/17/2016  . Tick bite of back 09/17/2016  . Menopause 03/25/2016  . Essential hypertension 07/21/2015  . Vitamin D deficiency 07/21/2015  . Cough 06/28/2015  . Trigger thumb of left hand 11/20/2014  . Trigger thumb 04/23/2014  . Rash and nonspecific skin eruption 04/23/2014  . Abscess of skin of abdomen 04/09/2014  . Onychomycosis 04/09/2014  . Pain of right thumb 04/04/2014  . Smoker 06/26/2013  . Eye pain 11/09/2012  . Right-sided chest wall pain 09/11/2012  . Well adult exam 12/28/2010  . PSORIASIS 02/02/2010  . SINUSITIS, ACUTE 08/28/2009  . B12 deficiency 02/10/2009  . Anxiety state 08/11/2008  . DEPRESSION 08/11/2008  . Acute upper respiratory infection 06/03/2008  . INSOMNIA, PERSISTENT 02/14/2008  . PHARYNGITIS 02/14/2008  . FATIGUE 02/14/2008  . SWEATING 02/14/2008  . TOBACCO USE DISORDER/SMOKER-SMOKING CESSATION DISCUSSED 06/08/2007  . Hypothyroidism 03/07/2007  . Allergic rhinitis 03/07/2007  . WEIGHT GAIN 03/07/2007  . ABNORMAL GLUCOSE NEC 03/07/2007  . Dyslipidemia 10/27/2006    Current Outpatient Medications  Medication Sig Dispense Refill  . ALPRAZolam (XANAX) 0.25 MG tablet TAKE 1 TABLET BY MOUTH TWICE DAILY AS NEEDED FOR ANXIETY 60 tablet 2  . Ascorbic Acid (VITAMIN C PO) Take by mouth.    Marland Kitchen aspirin 81 MG tablet Take 81 mg by mouth daily.      Marland Kitchen levothyroxine (SYNTHROID, LEVOTHROID) 88 MCG tablet Take 1 tablet (88 mcg total) by mouth daily. 90 tablet 3  . benzonatate (TESSALON) 100 MG capsule Take 1 capsule (100 mg total) by mouth 2 (two) times daily as needed for cough. 20 capsule 0  . doxycycline (VIBRA-TABS) 100 MG tablet Take 1 tablet (100 mg total) by mouth 2 (two) times daily. 20 tablet 0   No current facility-administered  medications for this visit.     Allergies: Atorvastatin; Crestor [rosuvastatin calcium]; Ezetimibe-simvastatin; and Hydrocodone-homatropine  Past Medical History:  Diagnosis Date  . Allergy    rhinitis  . Anxiety   . B12 deficiency   . Cyst    gangeous- left hand  . Depression   . Hyperlipidemia   . Hypothyroidism   . Tobacco abuse   . Vitamin D deficiency 11/2017   Value 21    Past Surgical History:  Procedure Laterality Date  . CESAREAN SECTION  1992  . EYE SURGERY    . GANGLION CYST EXCISION  2009   right ankle    Family History  Problem Relation Age of Onset  . Thyroid disease Mother        Low  . Other Mother        B12 deficiency  . Heart disease Father 77       CAD  . Coronary artery disease Other   . Hyperlipidemia Other   . Colon cancer Neg Hx     Social History   Tobacco Use  . Smoking status: Current Every Day Smoker    Packs/day: 0.25    Types: Cigarettes  . Smokeless tobacco: Never Used  Substance Use Topics  . Alcohol use: Yes    Alcohol/week: 0.0 standard drinks    Comment: Rare     Subjective:  Vanessa [name] is here today requesting evaluation of acute complaint of cough/cold symptoms, which first began about 1  week ago/ this past weekend, seeming to get worse since onset, cough is now productive with yellow sputum. Reports: headaches, sinus pain and pressure, sore throat , dry cough, wheezing, body aches Denies: fevers, chills, syncope, confusion, abdominal pain, nausea, vomiting, diarrhea Smoker? Some day smoker  Tried at home: zyrtec, mucinex, robitussin, ibuprofen She was seen by another provider for same complaint on 04/24/17, with negative strep, flu test, given depo IM and hycodan prn but symptoms are worse and she is concerned due to husband being hospitalized currently with PNA and flu. She also noticed severe itching after the hycodan so she stopped taking it.  ROS- See HPI  Objective:  Vitals:   04/27/18 1411 04/27/18 1456  BP: (!)  160/108 120/85  Pulse: 95   Temp: 98.3 F (36.8 C)   TempSrc: Oral   SpO2: 97%   Weight: 183 lb (83 kg)   Height: 5' 2.5" (1.588 m)     General: Well developed, well nourished, in no acute distress  Skin : Warm and dry.  Head: Normocephalic and atraumatic  Eyes: Sclera and conjunctiva clear; pupils round and reactive to light; extraocular movements intact  Ears: External normal; canals clear; tympanic membranes bulging bilaterally without injection or effusion Oropharynx: Mild posterior oropharyngeal erythema without exudate or edema. No suspicious lesions  Neck: Supple without thyromegaly, adenopathy  Lungs: Respirations unlabored; clear to auscultation bilaterally  CVS exam: normal rate and regular rhythm, S1 and S2 normal.  Extremities: No edema, cyanosis, clubbing  Vessels: Symmetric bilaterally  Neurologic: Alert and oriented; speech intact; face symmetrical; moves all extremities well; CNII-XII intact without focal deficit  Psychiatric: Normal mood and affect.  Assessment:  1. Upper respiratory tract infection, unspecified type   2. Bronchitis   3. B12 deficiency     Plan:   Due to duration, worsening symptoms, will treat with antibiotic course, tessalon for cough- medication dosing, side effects discussed Home management, smoking cessation, red flags and return precautions including when to seek immediate care discussed and printed on AVS- she will f/u for new, worsening symptoms or if symptoms persist after antibiotic course  No follow-ups on file.  No orders of the defined types were placed in this encounter.   Requested Prescriptions   Signed Prescriptions Disp Refills  . doxycycline (VIBRA-TABS) 100 MG tablet 20 tablet 0    Sig: Take 1 tablet (100 mg total) by mouth 2 (two) times daily.  . benzonatate (TESSALON) 100 MG capsule 20 capsule 0    Sig: Take 1 capsule (100 mg total) by mouth 2 (two) times daily as needed for cough.

## 2018-04-30 ENCOUNTER — Ambulatory Visit: Payer: BC Managed Care – PPO

## 2018-05-25 ENCOUNTER — Other Ambulatory Visit: Payer: BC Managed Care – PPO

## 2018-05-25 DIAGNOSIS — E559 Vitamin D deficiency, unspecified: Secondary | ICD-10-CM

## 2018-05-26 LAB — VITAMIN D 25 HYDROXY (VIT D DEFICIENCY, FRACTURES): Vit D, 25-Hydroxy: 29 ng/mL — ABNORMAL LOW (ref 30–100)

## 2018-05-28 ENCOUNTER — Other Ambulatory Visit: Payer: Self-pay | Admitting: *Deleted

## 2018-05-28 ENCOUNTER — Ambulatory Visit (INDEPENDENT_AMBULATORY_CARE_PROVIDER_SITE_OTHER): Payer: BC Managed Care – PPO

## 2018-05-28 DIAGNOSIS — E538 Deficiency of other specified B group vitamins: Secondary | ICD-10-CM

## 2018-05-28 DIAGNOSIS — E559 Vitamin D deficiency, unspecified: Secondary | ICD-10-CM

## 2018-05-28 MED ORDER — VITAMIN D (ERGOCALCIFEROL) 1.25 MG (50000 UNIT) PO CAPS: 50000.0000 [IU] | ORAL_CAPSULE | ORAL | 0 refills | Status: DC

## 2018-05-28 MED ORDER — CYANOCOBALAMIN 1000 MCG/ML IJ SOLN
1000.0000 ug | Freq: Once | INTRAMUSCULAR | Status: AC
  Administered 2018-05-28: 1000 ug via INTRAMUSCULAR

## 2018-06-07 NOTE — Progress Notes (Signed)
Medical screening examination/treatment/procedure(s) were performed by non-physician practitioner and as supervising physician I was immediately available for consultation/collaboration. I agree with above. Aleksei Plotnikov, MD  

## 2018-06-19 ENCOUNTER — Telehealth: Payer: Self-pay | Admitting: *Deleted

## 2018-06-19 MED ORDER — FLUCONAZOLE 150 MG PO TABS: 150.0000 mg | ORAL_TABLET | Freq: Once | ORAL | 0 refills | Status: AC

## 2018-06-19 NOTE — Telephone Encounter (Signed)
Okay for Diflucan 150 mg x 1 dose 

## 2018-06-19 NOTE — Telephone Encounter (Signed)
Patient was treated for sinus infection x 2 by PCP (notes in epic) patient has tried OTC monistat and no relief. Only complaint in vaginal itching, no discharge or odor, asked if diflucan tablet can be sent to pharmacy?

## 2018-06-19 NOTE — Telephone Encounter (Signed)
Patient aware, Rx sent.  

## 2018-06-21 ENCOUNTER — Ambulatory Visit: Payer: BC Managed Care – PPO | Admitting: Gynecology

## 2018-06-28 ENCOUNTER — Ambulatory Visit: Payer: BC Managed Care – PPO

## 2018-06-29 ENCOUNTER — Encounter: Payer: Self-pay | Admitting: Obstetrics & Gynecology

## 2018-06-29 ENCOUNTER — Other Ambulatory Visit: Payer: Self-pay

## 2018-06-29 ENCOUNTER — Ambulatory Visit: Payer: BC Managed Care – PPO | Admitting: Obstetrics & Gynecology

## 2018-06-29 VITALS — BP 128/82

## 2018-06-29 DIAGNOSIS — N9489 Other specified conditions associated with female genital organs and menstrual cycle: Secondary | ICD-10-CM | POA: Diagnosis not present

## 2018-06-29 MED ORDER — CLOBETASOL PROPIONATE 0.05 % EX OINT: 1.0000 "application " | TOPICAL_OINTMENT | Freq: Two times a day (BID) | CUTANEOUS | 1 refills | Status: AC

## 2018-06-29 NOTE — Progress Notes (Signed)
    Vanessa [name] FKCLEX 12/29/1960 [id number]        58 y.o.  G2P2L2 Married  RP: Vaginal discharge with itching and burning  HPI: Menopause, no HRT, no PMB.  Had vaginal/vulvar itching treated with Fluconazole, but symptoms not completely resolved so tried Monistat and started burning even more after that.  Some lower abdominal discomfort associated with changes in nutrition.  BMs constipated.  Urine normal.  Abstinent x many months.  No fever.   OB History  Gravida Para Term Preterm AB Living  2 2       2   SAB TAB Ectopic Multiple Live Births               # Outcome Date GA Lbr Len/2nd Weight Sex Delivery Anes PTL Lv  2 Para           1 Para             Past medical history,surgical history, problem list, medications, allergies, family history and social history were all reviewed and documented in the EPIC chart.   Directed ROS with pertinent positives and negatives documented in the history of present illness/assessment and plan.  Exam:  Vitals:   06/29/18 1349  BP: 128/82   General appearance:  Normal  Abdomen: Soft, not distended.  Gynecologic exam: Vulva mild erythema, irritation.  Speculum: Cervix/vagina normal.  Normal secretions.  Wet prep negative  U/A:  Completely negative   Assessment/Plan:  58 y.o. G2P2   1. Vulvar burning Vulvar irritation and burning probably secondary to allergic reaction to Monistat.  Patient had a yeast infection treated with fluconazole.  She got better with that treatment but mild symptoms remained for which she used Monistat which produced this severe irritation and burning of the vulva.  Urine analysis and wet prep negative.  We will treat vulvar inflammation with clobetasol ointment a thin application on the affected vulva twice a day for 2 weeks, may decrease to once a day after a week if improving.  Usage reviewed and prescription sent to pharmacy. - WET PREP FOR TRICH, YEAST, CLUE - Urinalysis,Complete w/RFL Culture  Other  orders - clobetasol ointment (TEMOVATE) 0.05 %; Apply 1 application topically 2 (two) times daily for 14 days. After a week, if better, decrease to once a day x 1 week.  Counseling on above issues and coordination of care more than 50% for 15 minutes.  Princess Bruins MD, 2:01 PM 06/29/2018

## 2018-07-01 ENCOUNTER — Encounter: Payer: Self-pay | Admitting: Obstetrics & Gynecology

## 2018-07-01 NOTE — Patient Instructions (Signed)
1. Vulvar burning Vulvar irritation and burning probably secondary to allergic reaction to Monistat.  Patient had a yeast infection treated with fluconazole.  She got better with that treatment but mild symptoms remained for which she used Monistat which produced this severe irritation and burning of the vulva.  Urine analysis and wet prep negative.  We will treat vulvar inflammation with clobetasol ointment a thin application on the affected vulva twice a day for 2 weeks, may decrease to once a day after a week if improving.  Usage reviewed and prescription sent to pharmacy. - WET PREP FOR TRICH, YEAST, CLUE - Urinalysis,Complete w/RFL Culture  Other orders - clobetasol ointment (TEMOVATE) 0.05 %; Apply 1 application topically 2 (two) times daily for 14 days. After a week, if better, decrease to once a day x 1 week.  Cherl, it was a pleasure seeing you today!

## 2018-07-02 LAB — WET PREP FOR TRICH, YEAST, CLUE

## 2018-07-03 LAB — URINALYSIS, COMPLETE W/RFL CULTURE
Bacteria, UA: NONE SEEN /HPF
Bilirubin Urine: NEGATIVE
Glucose, UA: NEGATIVE
Hyaline Cast: NONE SEEN /LPF
Ketones, ur: NEGATIVE
Leukocyte Esterase: NEGATIVE
Nitrites, Initial: NEGATIVE
RBC / HPF: NONE SEEN /HPF (ref 0–2)
Specific Gravity, Urine: 1.02 (ref 1.001–1.03)
WBC, UA: NONE SEEN /HPF (ref 0–5)
pH: 5.5 (ref 5.0–8.0)

## 2018-07-03 LAB — NO CULTURE INDICATED

## 2018-08-09 ENCOUNTER — Telehealth: Payer: Self-pay

## 2018-08-09 NOTE — Telephone Encounter (Signed)
Copied from [place] [id number]. Topic: Appointment Scheduling - Scheduling Inquiry for Clinic >> Aug 09, 2018  8:04 AM Celene Kras A wrote: Reason for CRM: Pt would like to be scheduled for a b12 injection. Please advise. >> Aug 09, 2018  8:22 AM Para Skeans A wrote: Please advise if this can be scheduled   Patient has been scheduled in parking lot for 08/13/18

## 2018-08-13 ENCOUNTER — Ambulatory Visit (INDEPENDENT_AMBULATORY_CARE_PROVIDER_SITE_OTHER): Payer: BC Managed Care – PPO

## 2018-08-13 DIAGNOSIS — E538 Deficiency of other specified B group vitamins: Secondary | ICD-10-CM | POA: Diagnosis not present

## 2018-08-13 MED ORDER — CYANOCOBALAMIN 1000 MCG/ML IJ SOLN
1000.0000 ug | Freq: Once | INTRAMUSCULAR | Status: AC
  Administered 2018-08-13: 1000 ug via INTRAMUSCULAR

## 2018-08-22 NOTE — Progress Notes (Signed)
Medical screening examination/treatment/procedure(s) were performed by non-physician practitioner and as supervising physician I was immediately available for consultation/collaboration. I agree with above. Aleksei Plotnikov, MD  

## 2018-08-31 ENCOUNTER — Ambulatory Visit: Payer: BC Managed Care – PPO | Admitting: Family Medicine

## 2018-09-07 ENCOUNTER — Telehealth: Payer: Self-pay | Admitting: *Deleted

## 2018-09-07 ENCOUNTER — Telehealth: Payer: Self-pay | Admitting: Internal Medicine

## 2018-09-07 DIAGNOSIS — Z20822 Contact with and (suspected) exposure to covid-19: Secondary | ICD-10-CM

## 2018-09-07 NOTE — Telephone Encounter (Signed)
Pt called and scheduled for testing at [facility] site on 09/10/18 at 11:30 am. Pt advised to wear a mask and remain in car at appt time. Understanding verbalized.

## 2018-09-07 NOTE — Telephone Encounter (Signed)
Pt reports she is an employee of [employer] where there has been positive covid cases. [place] is closed through next week. She is asymptomatic at this time. Requesting testing if Dr. Alain Marion thinks necessary. [place] called and spoke with Tammy, routing encounter as instructed.  Please advise:[phone]

## 2018-09-07 NOTE — Telephone Encounter (Signed)
-----   Message from [place], [place] sent at 09/07/2018  3:59 PM EDT ----- Regarding: Covid testing Covid Testing per Dr. Alain Marion for exposure. Contact pt at [phone].

## 2018-09-07 NOTE — Telephone Encounter (Signed)
Called pt and sent message to set up for covid testing

## 2018-09-10 ENCOUNTER — Other Ambulatory Visit: Payer: BC Managed Care – PPO

## 2018-09-10 DIAGNOSIS — Z20822 Contact with and (suspected) exposure to covid-19: Secondary | ICD-10-CM

## 2018-09-12 LAB — NOVEL CORONAVIRUS, NAA: SARS-CoV-2, NAA: NOT DETECTED

## 2018-09-13 ENCOUNTER — Ambulatory Visit (INDEPENDENT_AMBULATORY_CARE_PROVIDER_SITE_OTHER): Payer: BC Managed Care – PPO

## 2018-09-13 ENCOUNTER — Ambulatory Visit: Payer: BC Managed Care – PPO

## 2018-09-13 DIAGNOSIS — E538 Deficiency of other specified B group vitamins: Secondary | ICD-10-CM | POA: Diagnosis not present

## 2018-09-13 MED ORDER — CYANOCOBALAMIN 1000 MCG/ML IJ SOLN
1000.0000 ug | Freq: Once | INTRAMUSCULAR | Status: AC
  Administered 2018-09-13: 1000 ug via INTRAMUSCULAR

## 2018-09-26 ENCOUNTER — Other Ambulatory Visit: Payer: Self-pay | Admitting: Emergency Medicine

## 2018-09-26 ENCOUNTER — Other Ambulatory Visit: Payer: Self-pay | Admitting: Internal Medicine

## 2018-09-27 ENCOUNTER — Other Ambulatory Visit: Payer: Self-pay

## 2018-09-27 ENCOUNTER — Other Ambulatory Visit: Payer: BC Managed Care – PPO

## 2018-09-27 DIAGNOSIS — E559 Vitamin D deficiency, unspecified: Secondary | ICD-10-CM

## 2018-09-28 LAB — VITAMIN D 25 HYDROXY (VIT D DEFICIENCY, FRACTURES): Vit D, 25-Hydroxy: 37 ng/mL (ref 30–100)

## 2018-10-08 ENCOUNTER — Other Ambulatory Visit: Payer: Self-pay | Admitting: Internal Medicine

## 2018-10-08 ENCOUNTER — Telehealth: Payer: Self-pay | Admitting: Internal Medicine

## 2018-10-08 NOTE — Telephone Encounter (Signed)
REFILL ALPRAZolam Duanne Moron) 0.25 MG tablet  [address] [place], [place] [phone] (Phone) [phone] (Fax)  Patient is out of medication, patient has an appt 7/20 and would like just a prescription for this week.  Call back # [phone]

## 2018-10-09 NOTE — Telephone Encounter (Signed)
RX sent

## 2018-10-15 ENCOUNTER — Ambulatory Visit (INDEPENDENT_AMBULATORY_CARE_PROVIDER_SITE_OTHER): Payer: BC Managed Care – PPO | Admitting: Internal Medicine

## 2018-10-15 ENCOUNTER — Other Ambulatory Visit (INDEPENDENT_AMBULATORY_CARE_PROVIDER_SITE_OTHER): Payer: BC Managed Care – PPO

## 2018-10-15 ENCOUNTER — Encounter: Payer: Self-pay | Admitting: Internal Medicine

## 2018-10-15 ENCOUNTER — Ambulatory Visit: Payer: BC Managed Care – PPO

## 2018-10-15 ENCOUNTER — Other Ambulatory Visit: Payer: Self-pay

## 2018-10-15 DIAGNOSIS — E034 Atrophy of thyroid (acquired): Secondary | ICD-10-CM

## 2018-10-15 DIAGNOSIS — E559 Vitamin D deficiency, unspecified: Secondary | ICD-10-CM | POA: Diagnosis not present

## 2018-10-15 DIAGNOSIS — I10 Essential (primary) hypertension: Secondary | ICD-10-CM | POA: Diagnosis not present

## 2018-10-15 DIAGNOSIS — E538 Deficiency of other specified B group vitamins: Secondary | ICD-10-CM | POA: Diagnosis not present

## 2018-10-15 LAB — CBC WITH DIFFERENTIAL/PLATELET
Basophils Absolute: 0.2 10*3/uL — ABNORMAL HIGH (ref 0.0–0.1)
Basophils Relative: 1.4 % (ref 0.0–3.0)
Eosinophils Absolute: 0.6 10*3/uL (ref 0.0–0.7)
Eosinophils Relative: 4.8 % (ref 0.0–5.0)
HCT: 40.7 % (ref 36.0–46.0)
Hemoglobin: 13.6 g/dL (ref 12.0–15.0)
Lymphocytes Relative: 28.3 % (ref 12.0–46.0)
Lymphs Abs: 3.4 10*3/uL (ref 0.7–4.0)
MCHC: 33.4 g/dL (ref 30.0–36.0)
MCV: 87.5 fl (ref 78.0–100.0)
Monocytes Absolute: 0.8 10*3/uL (ref 0.1–1.0)
Monocytes Relative: 6.3 % (ref 3.0–12.0)
Neutro Abs: 7.1 10*3/uL (ref 1.4–7.7)
Neutrophils Relative %: 59.2 % (ref 43.0–77.0)
Platelets: 363 10*3/uL (ref 150.0–400.0)
RBC: 4.65 Mil/uL (ref 3.87–5.11)
RDW: 14.9 % (ref 11.5–15.5)
WBC: 11.9 10*3/uL — ABNORMAL HIGH (ref 4.0–10.5)

## 2018-10-15 MED ORDER — LEVOTHYROXINE SODIUM 88 MCG PO TABS: 88.0000 ug | ORAL_TABLET | Freq: Every day | ORAL | 3 refills | Status: DC

## 2018-10-15 MED ORDER — CYANOCOBALAMIN 1000 MCG/ML IJ SOLN
1000.0000 ug | Freq: Once | INTRAMUSCULAR | Status: AC
  Administered 2018-10-15: 1000 ug via INTRAMUSCULAR

## 2018-10-15 MED ORDER — ALPRAZOLAM 0.25 MG PO TABS: 0.2500 mg | ORAL_TABLET | Freq: Two times a day (BID) | ORAL | 3 refills | Status: DC | PRN

## 2018-10-15 NOTE — Progress Notes (Signed)
Subjective:  Patient ID: Vanessa [name], female    DOB: [birth year]  Age: 58 y.o. MRN: [id number]  CC: Annual Exam   HPI Vanessa [name] presents for anxiety, hypothyroidism, dyslipidemia f/u  Outpatient Medications Prior to Visit  Medication Sig Dispense Refill  . ALPRAZolam (XANAX) 0.25 MG tablet TAKE 1 TABLET (0.25 MG TOTAL) BY MOUTH 2 (TWO) TIMES DAILY AS NEEDED. FOR ANXIETY 60 tablet 0  . aspirin 81 MG tablet Take 81 mg by mouth daily.      . benzonatate (TESSALON) 100 MG capsule Take 1 capsule (100 mg total) by mouth 2 (two) times daily as needed for cough. 20 capsule 0  . levothyroxine (SYNTHROID) 88 MCG tablet Take 1 tablet by mouth once daily 90 tablet 0  . Vitamin D, Ergocalciferol, (DRISDOL) 1.25 MG (50000 UT) CAPS capsule Take 1 capsule (50,000 Units total) by mouth every 7 (seven) days. 12 capsule 0   No facility-administered medications prior to visit.     ROS: Review of Systems  Constitutional: Negative for activity change, appetite change, chills, fatigue and unexpected weight change.  HENT: Negative for congestion, mouth sores and sinus pressure.   Eyes: Negative for visual disturbance.  Respiratory: Negative for cough and chest tightness.   Gastrointestinal: Negative for abdominal pain and nausea.  Genitourinary: Negative for difficulty urinating, frequency and vaginal pain.  Musculoskeletal: Negative for back pain and gait problem.  Skin: Negative for pallor and rash.  Neurological: Negative for dizziness, tremors, weakness, numbness and headaches.  Psychiatric/Behavioral: Negative for confusion, sleep disturbance and suicidal ideas.    Objective:  BP 132/86 (BP Location: Left Arm, Patient Position: Sitting, Cuff Size: Large)   Pulse 87   Temp 98.3 F (36.8 C) (Oral)   Ht 5' 2.5" (1.588 m)   Wt 186 lb (84.4 kg)   LMP 03/08/2006   SpO2 97%   BMI 33.48 kg/m   BP Readings from Last 3 Encounters:  10/15/18 132/86  06/29/18 128/82  04/27/18 120/85     Wt Readings from Last 3 Encounters:  10/15/18 186 lb (84.4 kg)  04/27/18 183 lb (83 kg)  04/24/18 182 lb (82.6 kg)    Physical Exam Constitutional:      General: She is not in acute distress.    Appearance: She is well-developed.  HENT:     Head: Normocephalic.     Right Ear: External ear normal.     Left Ear: External ear normal.     Nose: Nose normal.  Eyes:     General:        Right eye: No discharge.        Left eye: No discharge.     Conjunctiva/sclera: Conjunctivae normal.     Pupils: Pupils are equal, round, and reactive to light.  Neck:     Musculoskeletal: Normal range of motion and neck supple.     Thyroid: No thyromegaly.     Vascular: No JVD.     Trachea: No tracheal deviation.  Cardiovascular:     Rate and Rhythm: Normal rate and regular rhythm.     Heart sounds: Normal heart sounds.  Pulmonary:     Effort: No respiratory distress.     Breath sounds: No stridor. No wheezing.  Abdominal:     General: Bowel sounds are normal. There is no distension.     Palpations: Abdomen is soft. There is no mass.     Tenderness: There is no abdominal tenderness. There is no guarding or rebound.  Musculoskeletal:        General: No tenderness.  Lymphadenopathy:     Cervical: No cervical adenopathy.  Skin:    Findings: No erythema or rash.  Neurological:     Cranial Nerves: No cranial nerve deficit.     Motor: No abnormal muscle tone.     Coordination: Coordination normal.     Deep Tendon Reflexes: Reflexes normal.  Psychiatric:        Behavior: Behavior normal.        Thought Content: Thought content normal.        Judgment: Judgment normal.     Lab Results  Component Value Date   WBC 9.6 09/22/2017   HGB 14.7 09/22/2017   HCT 42.6 09/22/2017   PLT 343.0 09/22/2017   GLUCOSE 111 (H) 09/22/2017   CHOL 284 (H) 09/22/2017   TRIG 148.0 09/22/2017   HDL 38.30 (L) 09/22/2017   LDLDIRECT 209.0 08/17/2016   LDLCALC 216 (H) 09/22/2017   ALT 17 09/22/2017   AST  20 09/22/2017   NA 138 09/22/2017   K 4.3 09/22/2017   CL 103 09/22/2017   CREATININE 0.93 09/22/2017   BUN 21 09/22/2017   CO2 26 09/22/2017   TSH 3.64 09/22/2017   HGBA1C 5.7 07/21/2015    Dg Chest 2 View  Result Date: 09/20/2017 CLINICAL DATA:  One month of cough.  Current smoker. EXAM: CHEST - 2 VIEW COMPARISON:  None in PACs FINDINGS: The lungs are well-expanded. The interstitial markings are mildly prominent. There is no alveolar infiltrate or pleural effusion. The heart and pulmonary vascularity are normal. There is calcification in the wall of the aortic arch. The bony thorax exhibits no acute abnormality. IMPRESSION: Mild chronic bronchitic-smoking related changes. No alveolar pneumonia nor CHF. Thoracic aortic atherosclerosis. Electronically Signed   By: David  Martinique M.D.   On: 09/20/2017 09:45    Assessment & Plan:   There are no diagnoses linked to this encounter.   No orders of the defined types were placed in this encounter.    Follow-up: No follow-ups on file.  Walker Kehr, MD

## 2018-10-15 NOTE — Assessment & Plan Note (Signed)
[place]

## 2018-10-15 NOTE — Assessment & Plan Note (Signed)
B 12

## 2018-10-15 NOTE — Assessment & Plan Note (Signed)
Labs

## 2018-10-15 NOTE — Patient Instructions (Signed)
Cardiac CT calcium scoring test $150   Computed tomography, more commonly known as a CT or CAT scan, is a diagnostic medical imaging test. Like traditional x-rays, it produces multiple images or pictures of the inside of the body. The cross-sectional images generated during a CT scan can be reformatted in multiple planes. They can even generate three-dimensional images. These images can be viewed on a computer monitor, printed on film or by a 3D printer, or transferred to a CD or DVD. CT images of internal organs, bones, soft tissue and blood vessels provide greater detail than traditional x-rays, particularly of soft tissues and blood vessels. A cardiac CT scan for coronary calcium is a non-invasive way of obtaining information about the presence, location and extent of calcified plaque in the coronary arteries-the vessels that supply oxygen-containing blood to the heart muscle. Calcified plaque results when there is a build-up of fat and other substances under the inner layer of the artery. This material can calcify which signals the presence of atherosclerosis, a disease of the vessel wall, also called coronary artery disease (CAD). People with this disease have an increased risk for heart attacks. In addition, over time, progression of plaque build up (CAD) can narrow the arteries or even close off blood flow to the heart. The result may be chest pain, sometimes called "angina," or a heart attack. Because calcium is a marker of CAD, the amount of calcium detected on a cardiac CT scan is a helpful prognostic tool. The findings on cardiac CT are expressed as a calcium score. Another name for this test is coronary artery calcium scoring.  What are some common uses of the procedure? The goal of cardiac CT scan for calcium scoring is to determine if CAD is present and to what extent, even if there are no symptoms. It is a screening study that may be recommended by a physician for patients with risk factors  for CAD but no clinical symptoms. The major risk factors for CAD are: . high blood cholesterol levels  . family history of heart attacks  . diabetes  . high blood pressure  . cigarette smoking  . overweight or obese  . physical inactivity   A negative cardiac CT scan for calcium scoring shows no calcification within the coronary arteries. This suggests that CAD is absent or so minimal it cannot be seen by this technique. The chance of having a heart attack over the next two to five years is very low under these circumstances. A positive test means that CAD is present, regardless of whether or not the patient is experiencing any symptoms. The amount of calcification-expressed as the calcium score-may help to predict the likelihood of a myocardial infarction (heart attack) in the coming years and helps your medical doctor or cardiologist decide whether the patient may need to take preventive medicine or undertake other measures such as diet and exercise to lower the risk for heart attack. The extent of CAD is graded according to your calcium score:  Calcium Score  Presence of CAD (coronary artery disease)  0 No evidence of CAD   1-10 Minimal evidence of CAD  11-100 Mild evidence of CAD  101-400 Moderate evidence of CAD  Over 400 Extensive evidence of CAD     These suggestions will probably help you to improve your metabolism if you are not overweight and to lose weight if you are overweight: 1.  Reduce your consumption of sugars and starches.  Eliminate high fructose corn syrup from your  diet.  Reduce your consumption of processed foods.  For desserts try to have seasonal fruits, berries with with green, nuts, cheeses or dark chocolate with more than 70% cacao. 2.  Do not snack 3.  You do not have to eat breakfast.  If you choose to have breakfast-eat plain greek yogurt, eggs, oatmeal (without sugar) 4.  Drink water, freshly brewed unsweetened tea (green, black or herbal) or coffee.  Do not  drink sodas including diet sodas , juices, beverages sweetened with artificial sweeteners. 5.  Reduce your consumption of refined grains. 6.  Avoid protein drinks such as Optifast, Slim fast etc. Eat chicken, fish, meat, dairy and beans for your sources of protein 7.  Natural unprocessed fats like cold pressed virgin olive oil, butter, coconut oil are good for you.  Eat avocados 8.  Increase your consumption of fiber.  Fruits, berries, vegetables, whole grains, flaxseeds, Chia seeds, beans, popcorn, nuts, oatmeal are good sources of fiber 9.  Use vinegar in your diet, i.e. apple cider vinegar, red wine or balsamic vinegar 10.  You can try fasting.  For example you can skip breakfast and lunch every other day (24-hour fast) 11.  Stress reduction, good night sleep, relaxation, meditation, yoga and other physical activity is likely to help you to maintain low weight too. 12.  If you drink alcohol, limit your alcohol intake to no more than 2 drinks a day.   Mediterranean diet is good for you.  The Mediterranean diet is a way of eating based on the traditional cuisine of countries bordering the [employer]. While there is no single definition of the Mediterranean diet, it is typically high in vegetables, fruits, whole grains, beans, nut and seeds, and olive oil. The main components of Mediterranean diet include: Marland Kitchen Daily consumption of vegetables, fruits, whole grains and healthy fats  . Weekly intake of fish, poultry, beans and eggs  . Moderate portions of dairy products  . Limited intake of red meat Other important elements of the Mediterranean diet are sharing meals with family and friends, enjoying a glass of red wine and being physically active. Health benefits of a Mediterranean diet: A traditional Mediterranean diet consisting of large quantities of fresh fruits and vegetables, nuts, fish and olive oil-coupled with physical activity-can reduce your risk of serious mental and physical  health problems by: Preventing heart disease and strokes. Following a Mediterranean diet limits your intake of refined breads, processed foods, and red meat, and encourages drinking red wine instead of hard liquor-all factors that can help prevent heart disease and stroke. Keeping you agile. If you're an older adult, the nutrients gained with a Mediterranean diet may reduce your risk of developing muscle weakness and other signs of frailty by about 70 percent. Reducing the risk of Alzheimer's. Research suggests that the [place] diet may improve cholesterol, blood sugar levels, and overall blood vessel health, which in turn may reduce your risk of Alzheimer's disease or dementia. Halving the risk of Parkinson's disease. The high levels of antioxidants in the Mediterranean diet can prevent cells from undergoing a damaging process called oxidative stress, thereby cutting the risk of Parkinson's disease in half. Increasing longevity. By reducing your risk of developing heart disease or cancer with the Mediterranean diet, you're reducing your risk of death at any age by 20%. Protecting against type 2 diabetes. A Mediterranean diet is rich in fiber which digests slowly, prevents huge swings in blood sugar, and can help you maintain a healthy weight.  Cabbage soup recipe that will not make you gain weight: Take 1 small head of CABG, 1 average pack of celery, 4 green peppers, 4 onions, 2 cans diced tomatoes (they are not available without salt), salt and spices to taste.  Chop cabbage, celery, peppers and onions.  And tomatoes and 2-2.5 liters (2.5 quarts) of water so that it would just cover the vegetables.  Bring to boil.  Add spices and salt.  Turn heat to low/medium and simmer for 20-25 minutes.  Naturally, you can make a smaller batch and change some of the ingredients.

## 2018-10-15 NOTE — Assessment & Plan Note (Signed)
Labs On Vit d

## 2018-10-16 LAB — VITAMIN B12: Vitamin B-12: >1500 pg/mL — ABNORMAL HIGH (ref 211–911)

## 2018-10-16 LAB — BASIC METABOLIC PANEL
BUN: 18 mg/dL (ref 6–23)
CO2: 25 mEq/L (ref 19–32)
Calcium: 9.2 mg/dL (ref 8.4–10.5)
Chloride: 104 mEq/L (ref 96–112)
Creatinine, Ser: 1.15 mg/dL (ref 0.40–1.20)
GFR: 58.55 mL/min — ABNORMAL LOW (ref >60.00)
Glucose, Bld: 86 mg/dL (ref 70–99)
Potassium: 4 mEq/L (ref 3.5–5.1)
Sodium: 138 mEq/L (ref 135–145)

## 2018-10-16 LAB — HEPATIC FUNCTION PANEL
ALT: 17 U/L (ref 0–35)
AST: 21 U/L (ref 0–37)
Albumin: 4.2 g/dL (ref 3.5–5.2)
Alkaline Phosphatase: 58 U/L (ref 39–117)
Bilirubin, Direct: 0 mg/dL (ref 0.0–0.3)
Total Bilirubin: 0.3 mg/dL (ref 0.2–1.2)
Total Protein: 7.7 g/dL (ref 6.0–8.3)

## 2018-10-16 LAB — URINALYSIS
Bilirubin Urine: NEGATIVE
Hgb urine dipstick: NEGATIVE
Ketones, ur: NEGATIVE
Leukocytes,Ua: NEGATIVE
Nitrite: NEGATIVE
Specific Gravity, Urine: 1.02 (ref 1.000–1.030)
Urine Glucose: NEGATIVE
Urobilinogen, UA: 0.2 (ref 0.0–1.0)
pH: 6 (ref 5.0–8.0)

## 2018-10-16 LAB — LIPID PANEL
Cholesterol: 250 mg/dL — ABNORMAL HIGH (ref 0–200)
HDL: 32.7 mg/dL — ABNORMAL LOW (ref >39.00)
NonHDL: 217.73
Total CHOL/HDL Ratio: 8
Triglycerides: 243 mg/dL — ABNORMAL HIGH (ref 0.0–149.0)
VLDL: 48.6 mg/dL — ABNORMAL HIGH (ref 0.0–40.0)

## 2018-10-16 LAB — TSH: TSH: 0.77 u[IU]/mL (ref 0.35–4.50)

## 2018-10-16 LAB — LDL CHOLESTEROL, DIRECT: Direct LDL: 199 mg/dL

## 2018-10-16 LAB — VITAMIN D 25 HYDROXY (VIT D DEFICIENCY, FRACTURES): VITD: 35.68 ng/mL (ref 30.00–100.00)

## 2018-11-03 NOTE — Progress Notes (Signed)
Medical screening examination/treatment/procedure(s) were performed by non-physician practitioner and as supervising physician I was immediately available for consultation/collaboration. I agree with above. Shlomo Seres, MD  

## 2018-11-15 ENCOUNTER — Ambulatory Visit: Payer: BC Managed Care – PPO

## 2018-11-19 ENCOUNTER — Ambulatory Visit: Payer: BC Managed Care – PPO

## 2018-11-22 ENCOUNTER — Ambulatory Visit (INDEPENDENT_AMBULATORY_CARE_PROVIDER_SITE_OTHER): Payer: BC Managed Care – PPO

## 2018-11-22 DIAGNOSIS — E538 Deficiency of other specified B group vitamins: Secondary | ICD-10-CM | POA: Diagnosis not present

## 2018-11-22 MED ORDER — CYANOCOBALAMIN 1000 MCG/ML IJ SOLN
1000.0000 ug | Freq: Once | INTRAMUSCULAR | Status: AC
  Administered 2018-11-22: 14:00:00 1000 ug via INTRAMUSCULAR

## 2018-11-30 NOTE — Progress Notes (Signed)
Medical screening examination/treatment/procedure(s) were performed by non-physician practitioner and as supervising physician I was immediately available for consultation/collaboration. I agree with above. Irvin Lizama, MD  

## 2018-12-06 NOTE — Progress Notes (Signed)
[name] [place], [place] [address] Phone: [phone] Subjective:   Fontaine No, am serving as a scribe for Dr. Hulan Saas.  I'm seeing this patient by the request  of:  Plotnikov, Vanessa Lacks, MD  CC: Foot pain  [id number]  Vanessa [name] is a 58 y.o. female coming in with complaint of bilateral heel pain for 2 weeks, R>L. Pain in the mornings when she gets up. Pain throughout the day improves throughout the day and near the end of day her pain is worse. Is on her feet a lot at work. Has been using Advil for pain. Has not noticed a difference with supportive shoes.       Past Medical History:  Diagnosis Date  . Allergy    rhinitis  . Anxiety   . B12 deficiency   . Cyst    gangeous- left hand  . Depression   . Hyperlipidemia   . Hypothyroidism   . Tobacco abuse   . Vitamin D deficiency 11/2017   Value 21   Past Surgical History:  Procedure Laterality Date  . CESAREAN SECTION  1992  . EYE SURGERY    . GANGLION CYST EXCISION  2009   right ankle   Social History   Socioeconomic History  . Marital status: Married    Spouse name: Not on file  . Number of children: Not on file  . Years of education: Not on file  . Highest education level: Not on file  Occupational History  . Not on file  Social Needs  . Financial resource strain: Not on file  . Food insecurity    Worry: Not on file    Inability: Not on file  . Transportation needs    Medical: Not on file    Non-medical: Not on file  Tobacco Use  . Smoking status: Current Every Day Smoker    Packs/day: 0.25    Types: Cigarettes  . Smokeless tobacco: Never Used  Substance and Sexual Activity  . Alcohol use: Yes    Alcohol/week: 0.0 standard drinks    Comment: Rare  . Drug use: No  . Sexual activity: Yes    Birth control/protection: Post-menopausal    Comment: 1st intercourse 58 yo-Fewer than 5 partners  Lifestyle  . Physical activity    Days per  week: Not on file    Minutes per session: Not on file  . Stress: Not on file  Relationships  . Social Herbalist on phone: Not on file    Gets together: Not on file    Attends religious service: Not on file    Active member of club or organization: Not on file    Attends meetings of clubs or organizations: Not on file    Relationship status: Not on file  Other Topics Concern  . Not on file  Social History Narrative  . Not on file   Allergies  Allergen Reactions  . Atorvastatin     REACTION: aches  . Crestor [Rosuvastatin Calcium]     achy  . Ezetimibe-Simvastatin     REACTION: hives  . Hydrocodone-Homatropine Itching   Family History  Problem Relation Age of Onset  . Thyroid disease Mother        Low  . Other Mother        B12 deficiency  . Heart disease Father 105       CAD  . Coronary artery disease Other   .  Hyperlipidemia Other   . Colon cancer Neg Hx     Current Outpatient Medications (Endocrine & Metabolic):  .  levothyroxine (SYNTHROID) 88 MCG tablet, Take 1 tablet (88 mcg total) by mouth daily.  Current Outpatient Medications (Cardiovascular):  .  nitroGLYCERIN (NITRO-DUR) 0.2 mg/hr patch, Apply 1/4 of a patch to skin once daily.  Current Outpatient Medications (Respiratory):  .  benzonatate (TESSALON) 100 MG capsule, Take 1 capsule (100 mg total) by mouth 2 (two) times daily as needed for cough.  Current Outpatient Medications (Analgesics):  .  aspirin 81 MG tablet, Take 81 mg by mouth daily.     Current Outpatient Medications (Other):  Marland Kitchen  ALPRAZolam (XANAX) 0.25 MG tablet, Take 1 tablet (0.25 mg total) by mouth 2 (two) times daily as needed for anxiety. .  Vitamin D, Ergocalciferol, (DRISDOL) 1.25 MG (50000 UT) CAPS capsule, Take 1 capsule (50,000 Units total) by mouth every 7 (seven) days.    Past medical history, social, surgical and family history all reviewed in electronic medical record.  No pertanent information unless stated  regarding to the chief complaint.   Review of Systems:  No headache, visual changes, nausea, vomiting, diarrhea, constipation, dizziness, abdominal pain, skin rash, fevers, chills, night sweats, weight loss, swollen lymph nodes, body aches, joint swelling, muscle aches, chest pain, shortness of breath, mood changes.   Objective  Blood pressure (!) 124/92, pulse 89, height 5' 2.5" (1.588 m), weight 185 lb (83.9 kg), last menstrual period 03/08/2006, SpO2 98 %.    General: No apparent distress alert and oriented x3 mood and affect normal, dressed appropriately.  HEENT: Pupils equal, extraocular movements intact  Respiratory: Patient's speak in full sentences and does not appear short of breath  Cardiovascular: No lower extremity edema, non tender, no erythema  Skin: Warm dry intact with no signs of infection or rash on extremities or on axial skeleton.  Abdomen: Soft nontender  Neuro: Cranial nerves II through XII are intact, neurovascularly intact in all extremities with 2+ DTRs and 2+ pulses.  Lymph: No lymphadenopathy of posterior or anterior cervical chain or axillae bilaterally.  Gait mild antalgic MSK:  Non tender with full range of motion and good stability and symmetric strength and tone of shoulders, elbows, wrist, hip, knee bilaterally.  Right foot exam shows the patient does have tender to palpation over the medial calcaneal region.  Mild swelling over the calcaneal region itself.  Achilles intact with very mild tightness of the posterior cord  Limited musculoskeletal ultrasound was performed and interpreted by Lyndal Pulley  Limited ultrasound of patient's plantar fashion shows significant enlargement with hypoechoic changes in the area.  Possibly even some mild small tearing noted at the origin.  Impression: Severe plantar fasciitis  Procedure: Real-time Ultrasound Guided Injection of right plantar fascia Device: GE Logiq Q7 Ultrasound guided injection is preferred based  studies that show increased duration, increased effect, greater accuracy, decreased procedural pain, increased response rate, and decreased cost with ultrasound guided versus blind injection.  Verbal informed consent obtained.  Time-out conducted.  Noted no overlying erythema, induration, or other signs of local infection.  Skin prepped in a sterile fashion.  Local anesthesia: Topical Ethyl chloride.  With sterile technique and under real time ultrasound guidance: With a 25-gauge half inch needle injected with 0.5 cc of 0.5% Marcaine and 0.5 cc of Kenalog 40 mg/mL Completed without difficulty  Pain immediately resolved suggesting accurate placement of the medication.  Advised to call if fevers/chills, erythema, induration, drainage, or  persistent bleeding.  Images permanently stored and available for review in the ultrasound unit.  Impression: Technically successful ultrasound guided injection.   Impression and Recommendations:     This case required medical decision making of moderate complexity. The above documentation has been reviewed and is accurate and complete Lyndal Pulley, DO       Note: This dictation was prepared with Dragon dictation along with smaller phrase technology. Any transcriptional errors that result from this process are unintentional.

## 2018-12-07 ENCOUNTER — Other Ambulatory Visit: Payer: Self-pay

## 2018-12-07 ENCOUNTER — Encounter: Payer: Self-pay | Admitting: Family Medicine

## 2018-12-07 ENCOUNTER — Ambulatory Visit: Payer: BC Managed Care – PPO | Admitting: Family Medicine

## 2018-12-07 ENCOUNTER — Ambulatory Visit: Payer: Self-pay

## 2018-12-07 VITALS — BP 124/92 | HR 89 | Ht 62.5 in | Wt 185.0 lb

## 2018-12-07 DIAGNOSIS — M79671 Pain in right foot: Secondary | ICD-10-CM | POA: Diagnosis not present

## 2018-12-07 DIAGNOSIS — M79672 Pain in left foot: Secondary | ICD-10-CM

## 2018-12-07 DIAGNOSIS — M722 Plantar fascial fibromatosis: Secondary | ICD-10-CM | POA: Insufficient documentation

## 2018-12-07 MED ORDER — NITROGLYCERIN 0.2 MG/HR TD PT24: MEDICATED_PATCH | TRANSDERMAL | 0 refills | Status: DC

## 2018-12-07 NOTE — Patient Instructions (Addendum)
Good to see you.  Ice 20 minutes 2 times daily. Usually after activity and before bed. Exercises 3 times a week.  [name], Merrill shoes or New Balance greater than 700 Spenco Total Support Orthotics or Spenco Total Support Slim Nitroglycerin Protocol   Apply 1/4 nitroglycerin patch to affected area daily.  Change position of patch within the affected area every 24 hours.  You may experience a headache during the first 1-2 weeks of using the patch, these should subside.  If you experience headaches after beginning nitroglycerin patch treatment, you may take your preferred over the counter pain reliever.  Another side effect of the nitroglycerin patch is skin irritation or rash related to patch adhesive.  Please notify our office if you develop more severe headaches or rash, and stop the patch.  Tendon healing with nitroglycerin patch may require 12 to 24 weeks depending on the extent of injury.  Men should not use if taking Viagra, Cialis, or Levitra.   Do not use if you have migraines or rosacea.  Vitamin D 2000 IU daily  See me again in 5-6 weeks

## 2018-12-07 NOTE — Assessment & Plan Note (Signed)
Injection given today.  Tolerated the procedure well.  Discussed icing regimen and home exercises, discussed proper shoes and over-the-counter orthotics, nitroglycerin patch given and warned of potential side effects.  Follow-up again in 4 to 6 weeks

## 2018-12-25 ENCOUNTER — Encounter: Payer: Self-pay | Admitting: Gynecology

## 2018-12-26 ENCOUNTER — Ambulatory Visit (INDEPENDENT_AMBULATORY_CARE_PROVIDER_SITE_OTHER): Payer: BC Managed Care – PPO

## 2018-12-26 DIAGNOSIS — E538 Deficiency of other specified B group vitamins: Secondary | ICD-10-CM

## 2018-12-26 MED ORDER — CYANOCOBALAMIN 1000 MCG/ML IJ SOLN
1000.0000 ug | Freq: Once | INTRAMUSCULAR | Status: AC
  Administered 2018-12-26: 16:00:00 1000 ug via INTRAMUSCULAR

## 2019-01-06 NOTE — Progress Notes (Signed)
Medical screening examination/treatment/procedure(s) were performed by non-physician practitioner and as supervising physician I was immediately available for consultation/collaboration. I agree with above. Aleksei Plotnikov, MD  

## 2019-01-10 ENCOUNTER — Ambulatory Visit: Payer: BC Managed Care – PPO | Admitting: Family Medicine

## 2019-01-24 ENCOUNTER — Other Ambulatory Visit: Payer: Self-pay

## 2019-01-24 ENCOUNTER — Ambulatory Visit (INDEPENDENT_AMBULATORY_CARE_PROVIDER_SITE_OTHER): Payer: BC Managed Care – PPO

## 2019-01-24 DIAGNOSIS — Z23 Encounter for immunization: Secondary | ICD-10-CM | POA: Diagnosis not present

## 2019-01-24 DIAGNOSIS — E538 Deficiency of other specified B group vitamins: Secondary | ICD-10-CM | POA: Diagnosis not present

## 2019-01-24 MED ORDER — CYANOCOBALAMIN 1000 MCG/ML IJ SOLN
1000.0000 ug | Freq: Once | INTRAMUSCULAR | Status: AC
  Administered 2019-01-24: 16:00:00 1000 ug via INTRAMUSCULAR

## 2019-01-30 ENCOUNTER — Encounter: Payer: Self-pay | Admitting: Gynecology

## 2019-02-03 NOTE — Progress Notes (Signed)
Medical screening examination/treatment/procedure(s) were performed by non-physician practitioner and as supervising physician I was immediately available for consultation/collaboration. I agree with above. Aleksei Plotnikov, MD  

## 2019-02-25 ENCOUNTER — Ambulatory Visit: Payer: BC Managed Care – PPO

## 2019-02-27 ENCOUNTER — Other Ambulatory Visit: Payer: Self-pay

## 2019-02-27 ENCOUNTER — Ambulatory Visit (INDEPENDENT_AMBULATORY_CARE_PROVIDER_SITE_OTHER): Payer: BC Managed Care – PPO | Admitting: *Deleted

## 2019-02-27 DIAGNOSIS — E538 Deficiency of other specified B group vitamins: Secondary | ICD-10-CM

## 2019-02-27 MED ORDER — CYANOCOBALAMIN 1000 MCG/ML IJ SOLN
1000.0000 ug | Freq: Once | INTRAMUSCULAR | Status: AC
  Administered 2019-02-27: 1000 ug via INTRAMUSCULAR

## 2019-02-28 ENCOUNTER — Ambulatory Visit: Payer: BC Managed Care – PPO

## 2019-03-12 NOTE — Progress Notes (Signed)
Medical screening examination/treatment/procedure(s) were performed by non-physician practitioner and as supervising physician I was immediately available for consultation/collaboration. I agree with above. Ilaisaane Marts, MD  

## 2019-04-01 ENCOUNTER — Ambulatory Visit (INDEPENDENT_AMBULATORY_CARE_PROVIDER_SITE_OTHER): Payer: BC Managed Care – PPO

## 2019-04-01 ENCOUNTER — Other Ambulatory Visit: Payer: Self-pay

## 2019-04-01 DIAGNOSIS — E538 Deficiency of other specified B group vitamins: Secondary | ICD-10-CM

## 2019-04-01 MED ORDER — CYANOCOBALAMIN 1000 MCG/ML IJ SOLN
1000.0000 ug | Freq: Once | INTRAMUSCULAR | Status: AC
  Administered 2019-04-01: 15:00:00 1000 ug via INTRAMUSCULAR

## 2019-04-11 ENCOUNTER — Ambulatory Visit: Payer: BC Managed Care – PPO | Attending: Internal Medicine

## 2019-04-11 DIAGNOSIS — Z20822 Contact with and (suspected) exposure to covid-19: Secondary | ICD-10-CM

## 2019-04-12 LAB — NOVEL CORONAVIRUS, NAA: SARS-CoV-2, NAA: NOT DETECTED

## 2019-04-26 ENCOUNTER — Other Ambulatory Visit: Payer: Self-pay | Admitting: Internal Medicine

## 2019-04-26 NOTE — Telephone Encounter (Signed)
Check [facility] registry last filled 03/01/2019. Per officer policy [id number] turnaround time for refills. Will hold until MD return on Monday.Marland KitchenJohny Chess

## 2019-04-26 NOTE — Telephone Encounter (Signed)
    Patient calling to request refill ALPRAZolam (XANAX) 0.25 MG tablet

## 2019-04-28 NOTE — Progress Notes (Signed)
Medical screening examination/treatment/procedure(s) were performed by non-physician practitioner and as supervising physician I was immediately available for consultation/collaboration. I agree with above. Gwenette Wellons, MD  

## 2019-05-02 ENCOUNTER — Ambulatory Visit: Payer: BC Managed Care – PPO

## 2019-05-06 ENCOUNTER — Ambulatory Visit (INDEPENDENT_AMBULATORY_CARE_PROVIDER_SITE_OTHER): Payer: BC Managed Care – PPO | Admitting: *Deleted

## 2019-05-06 ENCOUNTER — Other Ambulatory Visit: Payer: Self-pay

## 2019-05-06 DIAGNOSIS — E538 Deficiency of other specified B group vitamins: Secondary | ICD-10-CM

## 2019-05-06 MED ORDER — CYANOCOBALAMIN 1000 MCG/ML IJ SOLN
1000.0000 ug | Freq: Once | INTRAMUSCULAR | Status: AC
  Administered 2019-05-06: 1000 ug via INTRAMUSCULAR

## 2019-05-16 ENCOUNTER — Ambulatory Visit: Payer: BC Managed Care – PPO

## 2019-05-20 ENCOUNTER — Ambulatory Visit: Payer: BC Managed Care – PPO | Attending: Family

## 2019-05-20 DIAGNOSIS — Z23 Encounter for immunization: Secondary | ICD-10-CM | POA: Insufficient documentation

## 2019-05-20 NOTE — Progress Notes (Signed)
   Covid-19 Vaccination Clinic  Name:  Vanessa [name]    MRN: [id number] DOB: 1960-12-15  05/20/2019  Ms. Park was observed post Covid-19 immunization for 15 minutes without incidence. She was provided with Vaccine Information Sheet and instruction to access the V-Safe system.   [name] was instructed to call 911 with any severe reactions post vaccine: Marland Kitchen Difficulty breathing  . Swelling of your face and throat  . A fast heartbeat  . A bad rash all over your body  . Dizziness and weakness    Immunizations Administered    Name Date Dose VIS Date Route   Moderna COVID-19 Vaccine 05/20/2019  3:31 PM 0.5 mL 02/26/2019 Intramuscular   Manufacturer: Moderna   Lot: [id number]   [id number]

## 2019-05-28 ENCOUNTER — Encounter: Payer: Self-pay | Admitting: *Deleted

## 2019-05-28 NOTE — Progress Notes (Signed)
Medical screening examination/treatment/procedure(s) were performed by non-physician practitioner and as supervising physician I was immediately available for consultation/collaboration. I agree with above. Tarahji Ramthun, MD  

## 2019-06-05 ENCOUNTER — Other Ambulatory Visit: Payer: Self-pay

## 2019-06-05 ENCOUNTER — Ambulatory Visit (INDEPENDENT_AMBULATORY_CARE_PROVIDER_SITE_OTHER): Payer: BC Managed Care – PPO | Admitting: *Deleted

## 2019-06-05 DIAGNOSIS — E538 Deficiency of other specified B group vitamins: Secondary | ICD-10-CM

## 2019-06-05 MED ORDER — CYANOCOBALAMIN 1000 MCG/ML IJ SOLN
1000.0000 ug | Freq: Once | INTRAMUSCULAR | Status: AC
  Administered 2019-06-05: 15:00:00 1000 ug via INTRAMUSCULAR

## 2019-06-05 NOTE — Progress Notes (Signed)
Pls cosign for B12 ing../lmb

## 2019-07-02 ENCOUNTER — Ambulatory Visit: Payer: BC Managed Care – PPO | Attending: Family

## 2019-07-02 DIAGNOSIS — Z23 Encounter for immunization: Secondary | ICD-10-CM

## 2019-07-02 NOTE — Progress Notes (Signed)
   Covid-19 Vaccination Clinic  Name:  Vanessa [name]    MRN: [id number] DOB: [birth year]  07/02/2019  [name] was observed post Covid-19 immunization for 15 minutes without incident. She was provided with Vaccine Information Sheet and instruction to access the V-Safe system.   [name] was instructed to call 911 with any severe reactions post vaccine: Marland Kitchen Difficulty breathing  . Swelling of face and throat  . A fast heartbeat  . A bad rash all over body  . Dizziness and weakness   Immunizations Administered    Name Date Dose VIS Date Route   Moderna COVID-19 Vaccine 07/02/2019  2:50 PM 0.5 mL 02/26/2019 Intramuscular   Manufacturer: Moderna   Lot: [id number]   [id number]

## 2019-07-08 ENCOUNTER — Other Ambulatory Visit: Payer: Self-pay

## 2019-07-08 ENCOUNTER — Ambulatory Visit (INDEPENDENT_AMBULATORY_CARE_PROVIDER_SITE_OTHER): Payer: BC Managed Care – PPO | Admitting: *Deleted

## 2019-07-08 DIAGNOSIS — E538 Deficiency of other specified B group vitamins: Secondary | ICD-10-CM | POA: Diagnosis not present

## 2019-07-08 MED ORDER — CYANOCOBALAMIN 1000 MCG/ML IJ SOLN
1000.0000 ug | Freq: Once | INTRAMUSCULAR | Status: AC
  Administered 2019-07-08: 1000 ug via INTRAMUSCULAR

## 2019-07-08 NOTE — Progress Notes (Signed)
Pls cosign for B12 inj../lmb  

## 2019-08-05 ENCOUNTER — Other Ambulatory Visit: Payer: Self-pay | Admitting: Internal Medicine

## 2019-08-07 ENCOUNTER — Other Ambulatory Visit: Payer: Self-pay

## 2019-08-07 ENCOUNTER — Ambulatory Visit (INDEPENDENT_AMBULATORY_CARE_PROVIDER_SITE_OTHER): Payer: BC Managed Care – PPO | Admitting: *Deleted

## 2019-08-07 DIAGNOSIS — E538 Deficiency of other specified B group vitamins: Secondary | ICD-10-CM | POA: Diagnosis not present

## 2019-08-07 MED ORDER — CYANOCOBALAMIN 1000 MCG/ML IJ SOLN
1000.0000 ug | Freq: Once | INTRAMUSCULAR | Status: AC
  Administered 2019-08-07: 1000 ug via INTRAMUSCULAR

## 2019-08-07 NOTE — Progress Notes (Signed)
Pls cosign for B12 inj../lmb  

## 2019-08-14 ENCOUNTER — Ambulatory Visit: Payer: BC Managed Care – PPO | Admitting: Internal Medicine

## 2019-09-09 ENCOUNTER — Ambulatory Visit (INDEPENDENT_AMBULATORY_CARE_PROVIDER_SITE_OTHER): Payer: BC Managed Care – PPO

## 2019-09-09 ENCOUNTER — Other Ambulatory Visit: Payer: Self-pay

## 2019-09-09 DIAGNOSIS — E538 Deficiency of other specified B group vitamins: Secondary | ICD-10-CM | POA: Diagnosis not present

## 2019-09-09 MED ORDER — CYANOCOBALAMIN 1000 MCG/ML IJ SOLN
1000.0000 ug | Freq: Once | INTRAMUSCULAR | Status: AC
  Administered 2019-09-09: 1000 ug via INTRAMUSCULAR

## 2019-09-09 NOTE — Progress Notes (Addendum)
Pt here for scheduled cyanocobalamin 1028mcg injection given in right deltoid.  Pt tolerated well. Pt has annual scheduled for next month & plans to receive monthly injection at that time.  Medical screening examination/treatment/procedure(s) were performed by non-physician practitioner and as supervising physician I was immediately available for consultation/collaboration.  I agree with above. Lew Dawes, MD

## 2019-09-18 ENCOUNTER — Other Ambulatory Visit: Payer: Self-pay

## 2019-09-18 ENCOUNTER — Ambulatory Visit (HOSPITAL_COMMUNITY)
Admission: EM | Admit: 2019-09-18 | Discharge: 2019-09-18 | Disposition: A | Discharge status: Home / Self Care | Payer: BC Managed Care – PPO | Attending: Internal Medicine | Admitting: Internal Medicine

## 2019-09-18 ENCOUNTER — Telehealth: Payer: BC Managed Care – PPO | Admitting: Family

## 2019-09-18 ENCOUNTER — Ambulatory Visit (HOSPITAL_COMMUNITY): Payer: Self-pay

## 2019-09-18 ENCOUNTER — Encounter (HOSPITAL_COMMUNITY): Payer: Self-pay

## 2019-09-18 DIAGNOSIS — Z7982 Long term (current) use of aspirin: Secondary | ICD-10-CM | POA: Insufficient documentation

## 2019-09-18 DIAGNOSIS — I10 Essential (primary) hypertension: Secondary | ICD-10-CM | POA: Diagnosis not present

## 2019-09-18 DIAGNOSIS — J301 Allergic rhinitis due to pollen: Secondary | ICD-10-CM | POA: Insufficient documentation

## 2019-09-18 DIAGNOSIS — E559 Vitamin D deficiency, unspecified: Secondary | ICD-10-CM | POA: Insufficient documentation

## 2019-09-18 DIAGNOSIS — E039 Hypothyroidism, unspecified: Secondary | ICD-10-CM | POA: Diagnosis not present

## 2019-09-18 DIAGNOSIS — Z79890 Hormone replacement therapy (postmenopausal): Secondary | ICD-10-CM | POA: Diagnosis not present

## 2019-09-18 DIAGNOSIS — R062 Wheezing: Secondary | ICD-10-CM | POA: Diagnosis not present

## 2019-09-18 DIAGNOSIS — Z20822 Contact with and (suspected) exposure to covid-19: Secondary | ICD-10-CM | POA: Diagnosis not present

## 2019-09-18 DIAGNOSIS — Z79899 Other long term (current) drug therapy: Secondary | ICD-10-CM | POA: Insufficient documentation

## 2019-09-18 DIAGNOSIS — H1010 Acute atopic conjunctivitis, unspecified eye: Secondary | ICD-10-CM | POA: Insufficient documentation

## 2019-09-18 DIAGNOSIS — E785 Hyperlipidemia, unspecified: Secondary | ICD-10-CM | POA: Insufficient documentation

## 2019-09-18 DIAGNOSIS — F17210 Nicotine dependence, cigarettes, uncomplicated: Secondary | ICD-10-CM | POA: Diagnosis not present

## 2019-09-18 DIAGNOSIS — F419 Anxiety disorder, unspecified: Secondary | ICD-10-CM | POA: Insufficient documentation

## 2019-09-18 DIAGNOSIS — R05 Cough: Secondary | ICD-10-CM | POA: Diagnosis present

## 2019-09-18 DIAGNOSIS — Z885 Allergy status to narcotic agent status: Secondary | ICD-10-CM | POA: Insufficient documentation

## 2019-09-18 DIAGNOSIS — J069 Acute upper respiratory infection, unspecified: Secondary | ICD-10-CM | POA: Diagnosis not present

## 2019-09-18 MED ORDER — ALBUTEROL SULFATE HFA 108 (90 BASE) MCG/ACT IN AERS: 1.0000 | INHALATION_SPRAY | Freq: Four times a day (QID) | RESPIRATORY_TRACT | 0 refills | Status: DC | PRN

## 2019-09-18 MED ORDER — PREDNISONE 20 MG PO TABS
20.0000 mg | ORAL_TABLET | Freq: Every day | ORAL | 0 refills | Status: AC
Start: 2019-09-18 — End: 2019-09-23

## 2019-09-18 MED ORDER — OLOPATADINE HCL 0.1 % OP SOLN: 1.0000 [drp] | Freq: Two times a day (BID) | OPHTHALMIC | 12 refills | Status: DC

## 2019-09-18 NOTE — ED Triage Notes (Signed)
Patient complains of URI sx. Reports it started 1 wk ago with a runny nose, red and itchy eyes, and her eyes watering. Started taking zyrtec and mucinex, but reports she now has a scratchy throat. Patient agreeable to COVID testing.

## 2019-09-18 NOTE — ED Provider Notes (Signed)
[facility]    CSN: [id number] Arrival date & time: 09/18/19  0846      History   Chief Complaint Chief Complaint  Patient presents with   URI    HPI Vanessa [name] is a 59 y.o. female comes to the urgent care with complaints of runny nose, nasal congestion, cough with wheezing as well as itchy eyes.  Patient says symptoms started a week ago and has been persistent.  She says that the symptoms started with itching and watering of the eye and then progressed to include some runny nose and facial pressure as well as ear fullness.  No loss of hearing.  Patient subsequently started having scratchy throat.  She has a cough which is productive in the mornings.  No fever no chills no sick contacts.  She is fully vaccinated against COVID-19 virus.  No nausea or vomiting.  No diarrhea.  Past Medical History:  Diagnosis Date   Allergy    rhinitis   Anxiety    B12 deficiency    Cyst    gangeous- left hand   Depression    Hyperlipidemia    Hypothyroidism    Tobacco abuse    Vitamin D deficiency 11/2017   Value 21    Patient Active Problem List   Diagnosis Date Noted   Plantar fasciitis, right 12/07/2018   Flank pain 02/02/2018   Acute pain of right shoulder 12/17/2017   Strep throat 09/17/2016   Tick bite of back 09/17/2016   Menopause 03/25/2016   Essential hypertension 07/21/2015   Vitamin D deficiency 07/21/2015   Cough 06/28/2015   Trigger thumb of left hand 11/20/2014   Trigger thumb 04/23/2014   Rash and nonspecific skin eruption 04/23/2014   Abscess of skin of abdomen 04/09/2014   Onychomycosis 04/09/2014   Pain of right thumb 04/04/2014   Smoker 06/26/2013   Eye pain 11/09/2012   Right-sided chest wall pain 09/11/2012   Well adult exam 12/28/2010   PSORIASIS 02/02/2010   SINUSITIS, ACUTE 08/28/2009   B12 deficiency 02/10/2009   Anxiety state 08/11/2008   DEPRESSION 08/11/2008   Acute upper respiratory infection  06/03/2008   INSOMNIA, PERSISTENT 02/14/2008   PHARYNGITIS 02/14/2008   FATIGUE 02/14/2008   SWEATING 02/14/2008   TOBACCO USE DISORDER/SMOKER-SMOKING CESSATION DISCUSSED 06/08/2007   Hypothyroidism 03/07/2007   Allergic rhinitis 03/07/2007   WEIGHT GAIN 03/07/2007   ABNORMAL GLUCOSE NEC 03/07/2007   Dyslipidemia 10/27/2006    Past Surgical History:  Procedure Laterality Date   [place] CYST EXCISION  2009   right ankle    OB History    Gravida  2   Para  2   Term      Preterm      AB      Living  2     SAB      TAB      Ectopic      Multiple      Live Births               Home Medications    Prior to Admission medications   Medication Sig Start Date End Date Taking? Authorizing Provider  albuterol (VENTOLIN HFA) 108 (90 Base) MCG/ACT inhaler Inhale 1-2 puffs into the lungs every 6 (six) hours as needed for wheezing or shortness of breath. 09/18/19   Millianna Szymborski, Myrene Galas, MD  ALPRAZolam Duanne Moron) 0.25 MG tablet Take 1 tablet by mouth twice  daily as needed for anxiety 08/07/19   Plotnikov, Evie Lacks, MD  aspirin 81 MG tablet Take 81 mg by mouth daily.      [provider]  benzonatate (TESSALON) 100 MG capsule Take 1 capsule (100 mg total) by mouth 2 (two) times daily as needed for cough. 04/27/18   Lance Sell, NP  levothyroxine (SYNTHROID) 88 MCG tablet Take 1 tablet (88 mcg total) by mouth daily. 10/15/18   Plotnikov, Evie Lacks, MD  nitroGLYCERIN (NITRO-DUR) 0.2 mg/hr patch Apply 1/4 of a patch to skin once daily. 12/07/18   Lyndal Pulley, DO  olopatadine (PATANOL) 0.1 % ophthalmic solution Place 1 drop into both eyes 2 (two) times daily. 09/18/19   Shaquanta Harkless, Myrene Galas, MD  predniSONE (DELTASONE) 20 MG tablet Take 1 tablet (20 mg total) by mouth daily for 5 days. 09/18/19 09/23/19  LampteyMyrene Galas, MD  Vitamin D, Ergocalciferol, (DRISDOL) 1.25 MG (50000 UT) CAPS capsule Take 1 capsule  (50,000 Units total) by mouth every 7 (seven) days. 05/28/18   Fontaine, Belinda Block, MD    Family History Family History  Problem Relation Age of Onset   Thyroid disease Mother        Low   Other Mother        B12 deficiency   Heart disease Father 59       CAD   Coronary artery disease Other    Hyperlipidemia Other    Colon cancer Neg Hx     Social History Social History   Tobacco Use   Smoking status: Current Every Day Smoker    Packs/day: 0.25    Types: Cigarettes   Smokeless tobacco: Never Used  Vaping Use   Vaping Use: Never used  Substance Use Topics   Alcohol use: Yes    Alcohol/week: 0.0 standard drinks    Comment: Rare   Drug use: No     Allergies   Atorvastatin, Crestor [rosuvastatin calcium], Ezetimibe-simvastatin, and Hydrocodone-homatropine   Review of Systems Review of Systems  Constitutional: Negative for activity change, chills, fatigue and fever.  HENT: Positive for congestion, ear pain, postnasal drip, rhinorrhea and sore throat. Negative for ear discharge, facial swelling and voice change.   Eyes: Positive for discharge, redness and itching. Negative for photophobia and visual disturbance.  Respiratory: Positive for chest tightness and wheezing. Negative for shortness of breath.   Gastrointestinal: Negative for diarrhea, nausea and vomiting.  Musculoskeletal: Negative for arthralgias, gait problem and myalgias.  Neurological: Negative for dizziness, light-headedness and headaches.     Physical Exam Triage Vital Signs ED Triage Vitals [09/18/19 0910]  Enc Vitals Group     BP (!) 164/85     Pulse Rate 70     Resp 14     Temp 99 F (37.2 C)     Temp src      SpO2 96 %     Weight      Height      Head Circumference      Peak Flow      Pain Score 0     Pain Loc      Pain Edu?      Excl. in [place]?    No data found.  Updated Vital Signs BP (!) 164/85    Pulse 70    Temp 99 F (37.2 C)    Resp 14    LMP 03/08/2006    SpO2 96%     Visual Acuity Right Eye Distance:  Left Eye Distance:   Bilateral Distance:    Right Eye Near:   Left Eye Near:    Bilateral Near:     Physical Exam Vitals and nursing note reviewed.  Constitutional:      General: She is not in acute distress.    Appearance: She is not ill-appearing.  HENT:     Right Ear: Tympanic membrane normal.     Left Ear: Tympanic membrane normal.  Eyes:     Conjunctiva/sclera: Conjunctivae normal.  Cardiovascular:     Rate and Rhythm: Normal rate and regular rhythm.     Pulses: Normal pulses.     Heart sounds: Normal heart sounds.  Pulmonary:     Effort: Pulmonary effort is normal.     Breath sounds: Wheezing present. No rhonchi or rales.  Abdominal:     General: Bowel sounds are normal. There is no distension.     Palpations: Abdomen is soft.     Tenderness: There is no abdominal tenderness.     Hernia: No hernia is present.  Musculoskeletal:        General: No swelling or signs of injury. Normal range of motion.  Skin:    General: Skin is warm.  Neurological:     Mental Status: She is alert.      UC Treatments / Results  Labs (all labs ordered are listed, but only abnormal results are displayed) Labs Reviewed  SARS CORONAVIRUS 2 (TAT 6-24 HRS)    EKG   Radiology No results found.  Procedures Procedures (including critical care time)  Medications Ordered in UC Medications - No data to display  Initial Impression / Assessment and Plan / UC Course  I have reviewed the triage vital signs and the nursing notes.  Pertinent labs & imaging results that were available during my care of the patient were reviewed by me and considered in my medical decision making (see chart for details).     1.  Viral URI with cough: COVID-19 PCR test sent Short course of prednisone given that the patient has some wheezing Albuterol inhaler Return precautions given If patient symptoms worsens she is advised to return to the urgent care  2.   Allergic conjunctivitis: Patanol eyedrops. Final Clinical Impressions(s) / UC Diagnoses   Final diagnoses:  Viral URI with cough   Discharge Instructions   None    ED Prescriptions    Medication Sig Dispense Auth. Provider   albuterol (VENTOLIN HFA) 108 (90 Base) MCG/ACT inhaler Inhale 1-2 puffs into the lungs every 6 (six) hours as needed for wheezing or shortness of breath. 18 g Marcell Chavarin, Myrene Galas, MD   olopatadine (PATANOL) 0.1 % ophthalmic solution Place 1 drop into both eyes 2 (two) times daily. 5 mL Miana Politte, Myrene Galas, MD   predniSONE (DELTASONE) 20 MG tablet Take 1 tablet (20 mg total) by mouth daily for 5 days. 5 tablet Laquanta Hummel, Myrene Galas, MD     PDMP not reviewed this encounter.   Chase Picket, MD 09/18/19 [phone]

## 2019-09-19 LAB — SARS CORONAVIRUS 2 (TAT 6-24 HRS): SARS Coronavirus 2: NEGATIVE

## 2019-10-09 ENCOUNTER — Other Ambulatory Visit: Payer: Self-pay

## 2019-10-09 ENCOUNTER — Ambulatory Visit (INDEPENDENT_AMBULATORY_CARE_PROVIDER_SITE_OTHER): Payer: BC Managed Care – PPO | Admitting: *Deleted

## 2019-10-09 DIAGNOSIS — E538 Deficiency of other specified B group vitamins: Secondary | ICD-10-CM | POA: Diagnosis not present

## 2019-10-09 MED ORDER — CYANOCOBALAMIN 1000 MCG/ML IJ SOLN
1000.0000 ug | Freq: Once | INTRAMUSCULAR | Status: AC
Start: 2019-10-09 — End: 2019-10-09
  Administered 2019-10-09: 1000 ug via INTRAMUSCULAR

## 2019-10-09 NOTE — Progress Notes (Addendum)
Pls cosign for B12 inj../lmb   Medical screening examination/treatment/procedure(s) were performed by non-physician practitioner and as supervising physician I was immediately available for consultation/collaboration.  I agree with above. Aleksei Plotnikov, MD  

## 2019-10-21 ENCOUNTER — Encounter: Payer: Self-pay | Admitting: Internal Medicine

## 2019-10-21 ENCOUNTER — Ambulatory Visit (INDEPENDENT_AMBULATORY_CARE_PROVIDER_SITE_OTHER): Payer: BC Managed Care – PPO | Admitting: Internal Medicine

## 2019-10-21 ENCOUNTER — Ambulatory Visit: Payer: BC Managed Care – PPO

## 2019-10-21 ENCOUNTER — Other Ambulatory Visit: Payer: Self-pay

## 2019-10-21 VITALS — BP 126/80 | HR 85 | Temp 98.4°F | Ht 62.5 in | Wt 180.0 lb

## 2019-10-21 DIAGNOSIS — E538 Deficiency of other specified B group vitamins: Secondary | ICD-10-CM

## 2019-10-21 DIAGNOSIS — E785 Hyperlipidemia, unspecified: Secondary | ICD-10-CM

## 2019-10-21 DIAGNOSIS — R062 Wheezing: Secondary | ICD-10-CM

## 2019-10-21 DIAGNOSIS — I10 Essential (primary) hypertension: Secondary | ICD-10-CM | POA: Diagnosis not present

## 2019-10-21 DIAGNOSIS — F17200 Nicotine dependence, unspecified, uncomplicated: Secondary | ICD-10-CM

## 2019-10-21 DIAGNOSIS — E559 Vitamin D deficiency, unspecified: Secondary | ICD-10-CM

## 2019-10-21 DIAGNOSIS — E034 Atrophy of thyroid (acquired): Secondary | ICD-10-CM

## 2019-10-21 DIAGNOSIS — F411 Generalized anxiety disorder: Secondary | ICD-10-CM

## 2019-10-21 MED ORDER — ALPRAZOLAM 0.25 MG PO TABS: 0.2500 mg | ORAL_TABLET | Freq: Two times a day (BID) | ORAL | 3 refills | Status: DC | PRN

## 2019-10-21 NOTE — Assessment & Plan Note (Signed)
Labs

## 2019-10-21 NOTE — Assessment & Plan Note (Signed)
Labs On B12 

## 2019-10-21 NOTE — Assessment & Plan Note (Addendum)
A cardiac CT scan for coronary calcium offfered Not taking Crestor

## 2019-10-21 NOTE — Assessment & Plan Note (Signed)
Xanax prn  Potential benefits of a long term benzodiazepines  use as well as potential risks  and complications were explained to the patient and were aknowledged. 

## 2019-10-21 NOTE — Patient Instructions (Signed)

## 2019-10-21 NOTE — Progress Notes (Signed)
Subjective:  Patient ID: Vanessa [name], female    DOB: 08/17/1960  Age: 59 y.o. MRN: [id number]  CC: No chief complaint on file.   HPI YANILEN ADAMIK presents for fatigue, vit B12, Vit D def F/u COPD - recent URI  Outpatient Medications Prior to Visit  Medication Sig Dispense Refill  . ALPRAZolam (XANAX) 0.25 MG tablet Take 1 tablet by mouth twice daily as needed for anxiety 60 tablet 0  . aspirin 81 MG tablet Take 81 mg by mouth daily.      Marland Kitchen levothyroxine (SYNTHROID) 88 MCG tablet Take 1 tablet (88 mcg total) by mouth daily. 90 tablet 3  . Vitamin D, Ergocalciferol, (DRISDOL) 1.25 MG (50000 UT) CAPS capsule Take 1 capsule (50,000 Units total) by mouth every 7 (seven) days. 12 capsule 0  . albuterol (VENTOLIN HFA) 108 (90 Base) MCG/ACT inhaler Inhale 1-2 puffs into the lungs every 6 (six) hours as needed for wheezing or shortness of breath. (Patient not taking: Reported on 10/21/2019) 18 g 0  . benzonatate (TESSALON) 100 MG capsule Take 1 capsule (100 mg total) by mouth 2 (two) times daily as needed for cough. (Patient not taking: Reported on 10/21/2019) 20 capsule 0  . nitroGLYCERIN (NITRO-DUR) 0.2 mg/hr patch Apply 1/4 of a patch to skin once daily. (Patient not taking: Reported on 10/21/2019) 30 patch 0  . olopatadine (PATANOL) 0.1 % ophthalmic solution Place 1 drop into both eyes 2 (two) times daily. (Patient not taking: Reported on 10/21/2019) 5 mL 12   No facility-administered medications prior to visit.    ROS: Review of Systems  Constitutional: Positive for fatigue. Negative for activity change, appetite change, chills and unexpected weight change.  HENT: Negative for congestion, mouth sores and sinus pressure.   Eyes: Negative for visual disturbance.  Respiratory: Negative for cough and chest tightness.   Gastrointestinal: Negative for abdominal pain and nausea.  Genitourinary: Negative for difficulty urinating, frequency and vaginal pain.  Musculoskeletal: Negative for  back pain and gait problem.  Skin: Negative for pallor and rash.  Neurological: Negative for dizziness, tremors, weakness, numbness and headaches.  Psychiatric/Behavioral: Negative for confusion, sleep disturbance and suicidal ideas.    Objective:  BP 126/80 (BP Location: Left Arm, Patient Position: Sitting, Cuff Size: Large)   Pulse 85   Temp 98.4 F (36.9 C) (Oral)   Ht 5' 2.5" (1.588 m)   Wt 180 lb (81.6 kg)   LMP 03/08/2006   SpO2 98%   BMI 32.40 kg/m   BP Readings from Last 3 Encounters:  10/21/19 126/80  09/18/19 (!) 164/85  12/07/18 (!) 124/92    Wt Readings from Last 3 Encounters:  10/21/19 180 lb (81.6 kg)  12/07/18 185 lb (83.9 kg)  10/15/18 186 lb (84.4 kg)    Physical Exam Constitutional:      General: She is not in acute distress.    Appearance: She is well-developed. She is obese.  HENT:     Head: Normocephalic.     Right Ear: External ear normal.     Left Ear: External ear normal.     Nose: Nose normal.  Eyes:     General:        Right eye: No discharge.        Left eye: No discharge.     Conjunctiva/sclera: Conjunctivae normal.     Pupils: Pupils are equal, round, and reactive to light.  Neck:     Thyroid: No thyromegaly.     Vascular: No  JVD.     Trachea: No tracheal deviation.  Cardiovascular:     Rate and Rhythm: Normal rate and regular rhythm.     Heart sounds: Normal heart sounds.  Pulmonary:     Effort: No respiratory distress.     Breath sounds: No stridor. No wheezing.  Abdominal:     General: Bowel sounds are normal. There is no distension.     Palpations: Abdomen is soft. There is no mass.     Tenderness: There is no abdominal tenderness. There is no guarding or rebound.  Musculoskeletal:        General: No tenderness.     Cervical back: Normal range of motion and neck supple.  Lymphadenopathy:     Cervical: No cervical adenopathy.  Skin:    Findings: No erythema or rash.  Neurological:     Mental Status: She is oriented to  person, place, and time.     Cranial Nerves: No cranial nerve deficit.     Motor: No abnormal muscle tone.     Coordination: Coordination normal.     Deep Tendon Reflexes: Reflexes normal.  Psychiatric:        Behavior: Behavior normal.        Thought Content: Thought content normal.        Judgment: Judgment normal.     Lab Results  Component Value Date   WBC 11.9 (H) 10/15/2018   HGB 13.6 10/15/2018   HCT 40.7 10/15/2018   PLT 363.0 10/15/2018   GLUCOSE 86 10/15/2018   CHOL 250 (H) 10/15/2018   TRIG 243.0 (H) 10/15/2018   HDL 32.70 (L) 10/15/2018   LDLDIRECT 199.0 10/15/2018   LDLCALC 216 (H) 09/22/2017   ALT 17 10/15/2018   AST 21 10/15/2018   NA 138 10/15/2018   K 4.0 10/15/2018   CL 104 10/15/2018   CREATININE 1.15 10/15/2018   BUN 18 10/15/2018   CO2 25 10/15/2018   TSH 0.77 10/15/2018   HGBA1C 5.7 07/21/2015    No results found.  Assessment & Plan:     Walker Kehr, MD

## 2019-10-21 NOTE — Assessment & Plan Note (Signed)
Discussed.

## 2019-10-22 LAB — CBC WITH DIFFERENTIAL/PLATELET
Absolute Monocytes: 857 cells/uL (ref 200–950)
Basophils Absolute: 122 cells/uL (ref 0–200)
Basophils Relative: 0.9 %
Eosinophils Absolute: 476 cells/uL (ref 15–500)
Eosinophils Relative: 3.5 %
HCT: 40.8 % (ref 35.0–45.0)
Hemoglobin: 13.7 g/dL (ref 11.7–15.5)
Lymphs Abs: 3686 cells/uL (ref 850–3900)
MCH: 29 pg (ref 27.0–33.0)
MCHC: 33.6 g/dL (ref 32.0–36.0)
MCV: 86.4 fL (ref 80.0–100.0)
MPV: 10.1 fL (ref 7.5–12.5)
Monocytes Relative: 6.3 %
Neutro Abs: 8459 cells/uL — ABNORMAL HIGH (ref 1500–7800)
Neutrophils Relative %: 62.2 %
Platelets: 390 10*3/uL (ref 140–400)
RBC: 4.72 10*6/uL (ref 3.80–5.10)
RDW: 14.3 % (ref 11.0–15.0)
Total Lymphocyte: 27.1 %
WBC: 13.6 10*3/uL — ABNORMAL HIGH (ref 3.8–10.8)

## 2019-10-22 LAB — COMPLETE METABOLIC PANEL WITH GFR
AG Ratio: 1.3 (calc) (ref 1.0–2.5)
ALT: 15 U/L (ref 6–29)
AST: 18 U/L (ref 10–35)
Albumin: 4.2 g/dL (ref 3.6–5.1)
Alkaline phosphatase (APISO): 62 U/L (ref 37–153)
BUN/Creatinine Ratio: 18 (calc) (ref 6–22)
BUN: 20 mg/dL (ref 7–25)
CO2: 24 mmol/L (ref 20–32)
Calcium: 9.3 mg/dL (ref 8.6–10.4)
Chloride: 103 mmol/L (ref 98–110)
Creat: 1.13 mg/dL — ABNORMAL HIGH (ref 0.50–1.05)
GFR, Est African American: 62 mL/min/{1.73_m2} (ref >=60)
GFR, Est Non African American: 53 mL/min/{1.73_m2} — ABNORMAL LOW (ref >=60)
Globulin: 3.3 g/dL (calc) (ref 1.9–3.7)
Glucose, Bld: 89 mg/dL (ref 65–99)
Potassium: 4.1 mmol/L (ref 3.5–5.3)
Sodium: 139 mmol/L (ref 135–146)
Total Bilirubin: 0.3 mg/dL (ref 0.2–1.2)
Total Protein: 7.5 g/dL (ref 6.1–8.1)

## 2019-10-22 LAB — LIPID PANEL
Cholesterol: 291 mg/dL — ABNORMAL HIGH (ref <200)
HDL: 38 mg/dL — ABNORMAL LOW (ref >=50)
LDL Cholesterol (Calc): 212 mg/dL (calc) — ABNORMAL HIGH
Non-HDL Cholesterol (Calc): 253 mg/dL (calc) — ABNORMAL HIGH (ref <130)
Total CHOL/HDL Ratio: 7.7 (calc) — ABNORMAL HIGH (ref <5.0)
Triglycerides: 217 mg/dL — ABNORMAL HIGH (ref <150)

## 2019-10-22 LAB — URINALYSIS
Bilirubin Urine: NEGATIVE
Glucose, UA: NEGATIVE
Hgb urine dipstick: NEGATIVE
Ketones, ur: NEGATIVE
Leukocytes,Ua: NEGATIVE
Nitrite: NEGATIVE
Specific Gravity, Urine: 1.012 (ref 1.001–1.03)
pH: 5.5 (ref 5.0–8.0)

## 2019-10-22 LAB — VITAMIN D 25 HYDROXY (VIT D DEFICIENCY, FRACTURES): Vit D, 25-Hydroxy: 46 ng/mL (ref 30–100)

## 2019-10-22 LAB — T4, FREE: Free T4: 1 ng/dL (ref 0.8–1.8)

## 2019-10-22 LAB — SPECIMEN COMPROMISED

## 2019-10-22 LAB — TSH: TSH: 6.04 mIU/L — ABNORMAL HIGH (ref 0.40–4.50)

## 2019-10-22 LAB — VITAMIN B12: Vitamin B-12: >2000 pg/mL — ABNORMAL HIGH (ref 200–1100)

## 2019-11-11 ENCOUNTER — Ambulatory Visit (INDEPENDENT_AMBULATORY_CARE_PROVIDER_SITE_OTHER): Payer: BC Managed Care – PPO | Admitting: *Deleted

## 2019-11-11 ENCOUNTER — Other Ambulatory Visit: Payer: Self-pay

## 2019-11-11 DIAGNOSIS — E538 Deficiency of other specified B group vitamins: Secondary | ICD-10-CM

## 2019-11-11 MED ORDER — CYANOCOBALAMIN 1000 MCG/ML IJ SOLN
1000.0000 ug | Freq: Once | INTRAMUSCULAR | Status: AC
  Administered 2019-11-11: 1000 ug via INTRAMUSCULAR

## 2019-11-11 NOTE — Progress Notes (Addendum)
Pls cosign for B12 inj../lmb   Medical screening examination/treatment/procedure(s) were performed by non-physician practitioner and as supervising physician I was immediately available for consultation/collaboration.  I agree with above. Aleksei Plotnikov, MD  

## 2019-12-06 ENCOUNTER — Telehealth: Payer: Self-pay | Admitting: Internal Medicine

## 2019-12-06 ENCOUNTER — Other Ambulatory Visit: Payer: Self-pay | Admitting: Internal Medicine

## 2019-12-06 NOTE — Telephone Encounter (Signed)
Patient is calling about a refill on medication levothyroxine (SYNTHROID) 88 MCG tablet  She said that the pharmacy told her there were no more refills.  Can be sent to [place], [place]

## 2019-12-09 NOTE — Telephone Encounter (Signed)
See 12/09/19 med refill

## 2019-12-12 ENCOUNTER — Other Ambulatory Visit: Payer: Self-pay

## 2019-12-12 ENCOUNTER — Ambulatory Visit (INDEPENDENT_AMBULATORY_CARE_PROVIDER_SITE_OTHER): Payer: BC Managed Care – PPO

## 2019-12-12 DIAGNOSIS — E538 Deficiency of other specified B group vitamins: Secondary | ICD-10-CM

## 2019-12-12 MED ORDER — CYANOCOBALAMIN 1000 MCG/ML IJ SOLN
1000.0000 ug | Freq: Once | INTRAMUSCULAR | Status: AC
Start: 2019-12-12 — End: 2019-12-12
  Administered 2019-12-12: 1000 ug via INTRAMUSCULAR

## 2019-12-12 NOTE — Progress Notes (Addendum)
Pt here for monthly B12 injection per Dr Alain Marion.  B12 1047mcg given IM right deltoid and pt tolerated injection well.  Pt to schedule next b12 monthly injection upon check out.  Medical screening examination/treatment/procedure(s) were performed by non-physician practitioner and as supervising physician I was immediately available for consultation/collaboration. I agree with above. Lew Dawes, MD

## 2020-01-13 ENCOUNTER — Ambulatory Visit (INDEPENDENT_AMBULATORY_CARE_PROVIDER_SITE_OTHER): Payer: BC Managed Care – PPO | Admitting: *Deleted

## 2020-01-13 ENCOUNTER — Other Ambulatory Visit: Payer: Self-pay

## 2020-01-13 DIAGNOSIS — E538 Deficiency of other specified B group vitamins: Secondary | ICD-10-CM | POA: Diagnosis not present

## 2020-01-13 MED ORDER — CYANOCOBALAMIN 1000 MCG/ML IJ SOLN
1000.0000 ug | Freq: Once | INTRAMUSCULAR | Status: AC
  Administered 2020-01-13: 1000 ug via INTRAMUSCULAR

## 2020-01-13 NOTE — Progress Notes (Signed)
Pls cosign for B12 inj in absence of PCP../lmb   

## 2020-01-30 ENCOUNTER — Ambulatory Visit: Payer: BC Managed Care – PPO

## 2020-01-31 ENCOUNTER — Encounter: Payer: Self-pay | Admitting: Obstetrics & Gynecology

## 2020-01-31 ENCOUNTER — Ambulatory Visit: Payer: BC Managed Care – PPO

## 2020-02-08 ENCOUNTER — Ambulatory Visit: Payer: BC Managed Care – PPO

## 2020-02-13 ENCOUNTER — Ambulatory Visit: Payer: BC Managed Care – PPO

## 2020-02-17 ENCOUNTER — Ambulatory Visit (INDEPENDENT_AMBULATORY_CARE_PROVIDER_SITE_OTHER): Payer: BC Managed Care – PPO | Admitting: Internal Medicine

## 2020-02-17 ENCOUNTER — Telehealth: Payer: Self-pay | Admitting: Internal Medicine

## 2020-02-17 ENCOUNTER — Other Ambulatory Visit: Payer: Self-pay

## 2020-02-17 ENCOUNTER — Encounter: Payer: Self-pay | Admitting: Internal Medicine

## 2020-02-17 DIAGNOSIS — L309 Dermatitis, unspecified: Secondary | ICD-10-CM | POA: Insufficient documentation

## 2020-02-17 DIAGNOSIS — B351 Tinea unguium: Secondary | ICD-10-CM | POA: Diagnosis not present

## 2020-02-17 DIAGNOSIS — E538 Deficiency of other specified B group vitamins: Secondary | ICD-10-CM | POA: Diagnosis not present

## 2020-02-17 DIAGNOSIS — E559 Vitamin D deficiency, unspecified: Secondary | ICD-10-CM

## 2020-02-17 MED ORDER — TRIAMCINOLONE ACETONIDE 0.5 % EX OINT
1.0000 | TOPICAL_OINTMENT | Freq: Four times a day (QID) | CUTANEOUS | 1 refills | Status: DC
Start: 2020-02-17 — End: 2020-03-18

## 2020-02-17 MED ORDER — CYANOCOBALAMIN 1000 MCG/ML IJ SOLN
1000.0000 ug | Freq: Once | INTRAMUSCULAR | Status: AC
  Administered 2020-02-17: 1000 ug via INTRAMUSCULAR

## 2020-02-17 MED ORDER — CICLOPIROX 8 % EX SOLN: Freq: Every day | CUTANEOUS | 0 refills | Status: DC

## 2020-02-17 NOTE — Patient Instructions (Signed)
Read about nickel in food   Gluten free trial for 4-6 weeks. OK to use gluten-free bread and gluten-free pasta.    Gluten-Free Diet for Celiac Disease, Adult The gluten-free diet includes all foods that do not contain gluten. Gluten is a protein that is found in wheat, rye, barley, and some other grains. Following the gluten-free diet is the only treatment for people with celiac disease. It helps to prevent damage to the intestines and improves or eliminates the symptoms of celiac disease. Following the gluten-free diet requires some planning. It can be challenging at first, but it gets easier with time and practice. There are more gluten-free options available today than ever before. If you need help finding gluten-free foods or if you have questions, talk with your diet and nutrition specialist (registered dietitian) or your health care provider. What do I need to know about a gluten-free diet?  All fruits, vegetables, and meats are safe to eat and do not contain gluten.  When grocery shopping, start by shopping in the produce, meat, and dairy sections. These sections are more likely to contain gluten-free foods. Then move to the aisles that contain packaged foods if you need to.  Read all food labels. Gluten is often added to foods. Always check the ingredient list and look for warnings, such as "may contain gluten."  Talk with your dietitian or health care provider before taking a gluten-free multivitamin or mineral supplement.  Be aware of gluten-free foods having contact with foods that contain gluten (cross-contamination). This can happen at home and with any processed foods. ? Talk with your health care provider or dietitian about how to reduce the risk of cross-contamination in your home. ? If you have questions about how a food is processed, ask the manufacturer. What key words help to identify gluten? Foods that list any of these key words on the label usually contain  gluten:  Wheat, flour, enriched flour, bromated flour, white flour, durum flour, graham flour, phosphated flour, self-rising flour, semolina, farina, barley (malt), rye, and oats.  Starch, dextrin, modified food starch, or cereal.  Thickening, fillers, or emulsifiers.  Malt flavoring, malt extract, or malt syrup.  Hydrolyzed vegetable protein.  In the [place] packaged foods that are gluten-free are required to be labeled "GF." These foods should be easy to identify and are safe to eat. In the [place] food companies are also required to list common food allergens, including wheat, on their labels. Recommended foods Grains  Amaranth, bean flours, 100% buckwheat flour, corn, millet, nut flours or nut meals, GF oats, quinoa, rice, sorghum, teff, rice wafers, pure cornmeal tortillas, popcorn, and hot cereals made from cornmeal. Hominy, rice, wild rice. Some Asian rice noodles or bean noodles. Arrowroot starch, corn bran, corn flour, corn germ, cornmeal, corn starch, potato flour, potato starch flour, and rice bran. Plain, brown, and sweet rice flours. Rice polish, soy flour, and tapioca starch. Vegetables  All plain fresh, frozen, and canned vegetables. Fruits  All plain fresh, frozen, canned, and dried fruits, and 100% fruit juices. Meats and other protein foods  All fresh beef, pork, poultry, fish, seafood, and eggs. Fish canned in water, oil, brine, or vegetable broth. Plain nuts and seeds, peanut butter. Some lunch meat and some frankfurters. Dried beans, dried peas, and lentils. Dairy  Fresh plain, dry, evaporated, or condensed milk. Cream, butter, sour cream, whipping cream, and most yogurts. Unprocessed cheese, most processed cheeses, some cottage cheese, some cream cheeses. Beverages  Coffee, tea, most herbal teas. Carbonated  beverages and some root beers. Wine, sake, and distilled spirits, such as gin, vodka, and whiskey. Most hard ciders. Fats and oils  Butter, margarine, vegetable  oil, hydrogenated butter, olive oil, shortening, lard, cream, and some mayonnaise. Some commercial salad dressings. Olives. Sweets and desserts  Sugar, honey, some syrups, molasses, jelly, and jam. Plain hard candy, marshmallows, and gumdrops. Pure cocoa powder. Plain chocolate. Custard and some pudding mixes. Gelatin desserts, sorbets, frozen ice pops, and sherbet. Cake, cookies, and other desserts prepared with allowed flours. Some commercial ice creams. Cornstarch, tapioca, and rice puddings. Seasoning and other foods  Some canned or frozen soups. Monosodium glutamate (MSG). Cider, rice, and wine vinegar. Baking soda and baking powder. Cream of tartar. Baking and nutritional yeast. Certain soy sauces made without wheat (ask your dietitian about specific brands that are allowed). Nuts, coconut, and chocolate. Salt, pepper, herbs, spices, flavoring extracts, imitation or artificial flavorings, natural flavorings, and food colorings. Some medicines and supplements. Some lip glosses and other cosmetics. Rice syrups. The items listed may not be a complete list. Talk with your dietitian about what dietary choices are best for you. Foods to avoid Grains  Barley, bran, bulgur, couscous, cracked wheat, Allen, farro, graham, malt, matzo, semolina, wheat germ, and all wheat and rye cereals including spelt and kamut. Cereals containing malt as a flavoring, such as rice cereal. Noodles, spaghetti, macaroni, most packaged rice mixes, and all mixes containing wheat, rye, barley, or triticale. Vegetables  Most creamed vegetables and most vegetables canned in sauces. Some commercially prepared vegetables and salads. Fruits  Thickened or prepared fruits and some pie fillings. Some fruit snacks and fruit roll-ups. Meats and other protein foods  Any meat or meat alternative containing wheat, rye, barley, or gluten stabilizers. These are often marinated or packaged meats and lunch meats. Bread-containing  products, such as Swiss steak, croquettes, meatballs, and meatloaf. Most tuna canned in vegetable broth and [place] with hydrolyzed vegetable protein (HVP) injected as part of the basting. Seitan. Imitation fish. Eggs in sauces made from ingredients to avoid. Dairy  Commercial chocolate milk drinks and malted milk. Some non-dairy creamers. Any cheese product containing ingredients to avoid. Beverages  Certain cereal beverages. Beer, ale, malted milk, and some root beers. Some hard ciders. Some instant flavored coffees. Some herbal teas made with barley or with barley malt added. Fats and oils  Some commercial salad dressings. Sour cream containing modified food starch. Sweets and desserts  Some toffees. Chocolate-coated nuts (may be rolled in wheat flour) and some commercial candies and candy bars. Most cakes, cookies, donuts, pastries, and other baked goods. Some commercial ice cream. Ice cream cones. Commercially prepared mixes for cakes, cookies, and other desserts. Bread pudding and other puddings thickened with flour. Products containing brown rice syrup made with barley malt enzyme. Desserts and sweets made with malt flavoring. Seasoning and other foods  Some curry powders, some dry seasoning mixes, some gravy extracts, some meat sauces, some ketchups, some prepared mustards, and horseradish. Certain soy sauces. Malt vinegar. Bouillon and bouillon cubes that contain HVP. Some chip dips, and some chewing gum. Yeast extract. Brewer's yeast. Caramel color. Some medicines and supplements. Some lip glosses and other cosmetics. The items listed may not be a complete list. Talk with your dietitian about what dietary choices are best for you. Summary  Gluten is a protein that is found in wheat, rye, barley, and some other grains. The gluten-free diet includes all foods that do not contain gluten.  If you need help  finding gluten-free foods or if you have questions, talk with your diet and nutrition  specialist (registered dietitian) or your health care provider.  Read all food labels. Gluten is often added to foods. Always check the ingredient list and look for warnings, such as "may contain gluten." This information is not intended to replace advice given to you by your health care provider. Make sure you discuss any questions you have with your health care provider. Document Released: 03/14/2005 Document Revised: 12/28/2015 Document Reviewed: 12/28/2015 Elsevier Interactive Patient Education  2018 [employer].

## 2020-02-17 NOTE — Assessment & Plan Note (Signed)
Triamc oint Trial of gluten free diet ?nickel

## 2020-02-17 NOTE — Assessment & Plan Note (Signed)
B12 inj 

## 2020-02-17 NOTE — Telephone Encounter (Signed)
ciclopirox (PENLAC) 8 % solution  [facility] [place], [place] (Ph: [phone])  Insurance will not pay unless they receive some type of explanation as to why she needs this medication.  Please follow up with [place] and the patient

## 2020-02-17 NOTE — Progress Notes (Signed)
Subjective:  Patient ID: Vanessa [name], female    DOB: 09/21/1960  Age: 59 y.o. MRN: [id number]  CC: Rash (On her (L) hand, but she states it spreading to her right hand. Pt states it itches and burn )   HPI REAGANN DOLCE presents for rash - B palms C/o toenail inj F/u B12 def  Outpatient Medications Prior to Visit  Medication Sig Dispense Refill  . ALPRAZolam (XANAX) 0.25 MG tablet Take 1 tablet (0.25 mg total) by mouth 2 (two) times daily as needed. for anxiety 60 tablet 3  . aspirin 81 MG tablet Take 81 mg by mouth every other day.     . cholecalciferol (VITAMIN D3) 25 MCG (1000 UNIT) tablet Take 1,000 Units by mouth daily.    Marland Kitchen levothyroxine (SYNTHROID) 88 MCG tablet Take 1 tablet by mouth once daily 90 tablet 3  . albuterol (VENTOLIN HFA) 108 (90 Base) MCG/ACT inhaler Inhale 1-2 puffs into the lungs every 6 (six) hours as needed for wheezing or shortness of breath. (Patient not taking: Reported on 10/21/2019) 18 g 0  . benzonatate (TESSALON) 100 MG capsule Take 1 capsule (100 mg total) by mouth 2 (two) times daily as needed for cough. (Patient not taking: Reported on 10/21/2019) 20 capsule 0  . Vitamin D, Ergocalciferol, (DRISDOL) 1.25 MG (50000 UT) CAPS capsule Take 1 capsule (50,000 Units total) by mouth every 7 (seven) days. (Patient not taking: Reported on 02/17/2020) 12 capsule 0   No facility-administered medications prior to visit.    ROS: Review of Systems  Constitutional: Negative for activity change, appetite change, chills, fatigue and unexpected weight change.  HENT: Negative for congestion, mouth sores and sinus pressure.   Eyes: Negative for visual disturbance.  Respiratory: Negative for cough and chest tightness.   Gastrointestinal: Negative for abdominal pain and nausea.  Genitourinary: Negative for difficulty urinating, frequency and vaginal pain.  Musculoskeletal: Negative for back pain and gait problem.  Skin: Positive for rash. Negative for pallor.    Neurological: Negative for dizziness, tremors, weakness, numbness and headaches.  Psychiatric/Behavioral: Negative for confusion and sleep disturbance.    Objective:  BP 132/84 (BP Location: Left Arm)   Pulse 76   Temp 98.4 F (36.9 C) (Oral)   Wt 181 lb (82.1 kg)   LMP 03/08/2006   SpO2 95%   BMI 32.58 kg/m   BP Readings from Last 3 Encounters:  02/17/20 132/84  10/21/19 126/80  09/18/19 (!) 164/85    Wt Readings from Last 3 Encounters:  02/17/20 181 lb (82.1 kg)  10/21/19 180 lb (81.6 kg)  12/07/18 185 lb (83.9 kg)    Physical Exam Constitutional:      General: She is not in acute distress.    Appearance: She is well-developed.  HENT:     Head: Normocephalic.     Right Ear: External ear normal.     Left Ear: External ear normal.     Nose: Nose normal.  Eyes:     General:        Right eye: No discharge.        Left eye: No discharge.     Conjunctiva/sclera: Conjunctivae normal.     Pupils: Pupils are equal, round, and reactive to light.  Neck:     Thyroid: No thyromegaly.     Vascular: No JVD.     Trachea: No tracheal deviation.  Cardiovascular:     Rate and Rhythm: Normal rate and regular rhythm.     Heart  sounds: Normal heart sounds.  Pulmonary:     Effort: No respiratory distress.     Breath sounds: No stridor. No wheezing.  Abdominal:     General: Bowel sounds are normal. There is no distension.     Palpations: Abdomen is soft. There is no mass.     Tenderness: There is no abdominal tenderness. There is no guarding or rebound.  Musculoskeletal:        General: No tenderness.     Cervical back: Normal range of motion and neck supple.  Lymphadenopathy:     Cervical: No cervical adenopathy.  Skin:    Findings: Rash present. No erythema.  Neurological:     Mental Status: She is oriented to person, place, and time.     Cranial Nerves: No cranial nerve deficit.     Motor: No abnormal muscle tone.     Coordination: Coordination normal.     Deep  Tendon Reflexes: Reflexes normal.  Psychiatric:        Behavior: Behavior normal.        Thought Content: Thought content normal.        Judgment: Judgment normal.    Patches of eczema B palms Onycho L big toenail  Lab Results  Component Value Date   WBC 13.6 (H) 10/21/2019   HGB 13.7 10/21/2019   HCT 40.8 10/21/2019   PLT 390 10/21/2019   GLUCOSE 89 10/21/2019   CHOL 291 (H) 10/21/2019   TRIG 217 (H) 10/21/2019   HDL 38 (L) 10/21/2019   LDLDIRECT 199.0 10/15/2018   LDLCALC 212 (H) 10/21/2019   ALT 15 10/21/2019   AST 18 10/21/2019   NA 139 10/21/2019   K 4.1 10/21/2019   CL 103 10/21/2019   CREATININE 1.13 (H) 10/21/2019   BUN 20 10/21/2019   CO2 24 10/21/2019   TSH 6.04 (H) 10/21/2019   HGBA1C 5.7 07/21/2015    No results found.  Assessment & Plan:    Walker Kehr, MD

## 2020-02-17 NOTE — Assessment & Plan Note (Signed)
Vit D 

## 2020-02-18 NOTE — Telephone Encounter (Signed)
Diagnosis-onychomycosis of the toenails.  Thanks

## 2020-02-19 NOTE — Telephone Encounter (Addendum)
Tried to complete PA on cover-my-meds w/ Key #  Key: [id number]. Received msg Please advise the dispensing pharmacy to contact the [place] at [phone] for assistance. Called [facility] spoke w/pharmacist gave her msg receive from PA she states the office need to call [phone].Hulen Skains # spoke w/rep Luiz Ochoa he proceeded with doing PA w/key [id number].../lmb

## 2020-02-25 NOTE — Telephone Encounter (Signed)
Follow up message  Patient calling for status of PA

## 2020-03-05 NOTE — Telephone Encounter (Signed)
Tried to complete PA vis cover-my-meds w/Key: [id number]. Rec'd msg stating please advise the dispensing pharmacy to contact the [facility] at [phone] for assistance. Tried calling but was transferred and the phone cut off.Marland KitchenJohny Chess

## 2020-03-10 ENCOUNTER — Ambulatory Visit: Payer: BC Managed Care – PPO | Admitting: Internal Medicine

## 2020-03-18 ENCOUNTER — Encounter: Payer: Self-pay | Admitting: Internal Medicine

## 2020-03-18 ENCOUNTER — Ambulatory Visit (INDEPENDENT_AMBULATORY_CARE_PROVIDER_SITE_OTHER): Payer: BC Managed Care – PPO | Admitting: Internal Medicine

## 2020-03-18 ENCOUNTER — Other Ambulatory Visit: Payer: Self-pay

## 2020-03-18 ENCOUNTER — Ambulatory Visit: Payer: BC Managed Care – PPO

## 2020-03-18 VITALS — BP 128/82 | HR 80 | Temp 98.2°F | Wt 180.8 lb

## 2020-03-18 DIAGNOSIS — M2559 Pain in other specified joint: Secondary | ICD-10-CM

## 2020-03-18 DIAGNOSIS — R194 Change in bowel habit: Secondary | ICD-10-CM | POA: Diagnosis not present

## 2020-03-18 DIAGNOSIS — E538 Deficiency of other specified B group vitamins: Secondary | ICD-10-CM | POA: Diagnosis not present

## 2020-03-18 DIAGNOSIS — L309 Dermatitis, unspecified: Secondary | ICD-10-CM | POA: Diagnosis not present

## 2020-03-18 LAB — T4, FREE: Free T4: 0.81 ng/dL (ref 0.60–1.60)

## 2020-03-18 LAB — CBC WITH DIFFERENTIAL/PLATELET
Basophils Absolute: 0.1 10*3/uL (ref 0.0–0.1)
Basophils Relative: 1 % (ref 0.0–3.0)
Eosinophils Absolute: 0.6 10*3/uL (ref 0.0–0.7)
Eosinophils Relative: 5.3 % — ABNORMAL HIGH (ref 0.0–5.0)
HCT: 42.6 % (ref 36.0–46.0)
Hemoglobin: 14.3 g/dL (ref 12.0–15.0)
Lymphocytes Relative: 26 % (ref 12.0–46.0)
Lymphs Abs: 3.1 10*3/uL (ref 0.7–4.0)
MCHC: 33.6 g/dL (ref 30.0–36.0)
MCV: 86.1 fl (ref 78.0–100.0)
Monocytes Absolute: 0.7 10*3/uL (ref 0.1–1.0)
Monocytes Relative: 6.2 % (ref 3.0–12.0)
Neutro Abs: 7.3 10*3/uL (ref 1.4–7.7)
Neutrophils Relative %: 61.5 % (ref 43.0–77.0)
Platelets: 343 10*3/uL (ref 150.0–400.0)
RBC: 4.95 Mil/uL (ref 3.87–5.11)
RDW: 14.8 % (ref 11.5–15.5)
WBC: 11.8 10*3/uL — ABNORMAL HIGH (ref 4.0–10.5)

## 2020-03-18 LAB — COMPREHENSIVE METABOLIC PANEL
ALT: 15 U/L (ref 0–35)
AST: 17 U/L (ref 0–37)
Albumin: 4.4 g/dL (ref 3.5–5.2)
Alkaline Phosphatase: 57 U/L (ref 39–117)
BUN: 21 mg/dL (ref 6–23)
CO2: 26 mEq/L (ref 19–32)
Calcium: 9.4 mg/dL (ref 8.4–10.5)
Chloride: 102 mEq/L (ref 96–112)
Creatinine, Ser: 1.09 mg/dL (ref 0.40–1.20)
GFR: 55.47 mL/min — ABNORMAL LOW (ref >60.00)
Glucose, Bld: 78 mg/dL (ref 70–99)
Potassium: 3.8 mEq/L (ref 3.5–5.1)
Sodium: 137 mEq/L (ref 135–145)
Total Bilirubin: 0.4 mg/dL (ref 0.2–1.2)
Total Protein: 8.3 g/dL (ref 6.0–8.3)

## 2020-03-18 LAB — TSH: TSH: 3.59 u[IU]/mL (ref 0.35–4.50)

## 2020-03-18 LAB — SEDIMENTATION RATE: Sed Rate: 66 mm/hr — ABNORMAL HIGH (ref 0–30)

## 2020-03-18 MED ORDER — CYANOCOBALAMIN 1000 MCG/ML IJ SOLN
1000.0000 ug | Freq: Once | INTRAMUSCULAR | Status: AC
  Administered 2020-03-18: 1000 ug via INTRAMUSCULAR

## 2020-03-18 MED ORDER — CLOTRIMAZOLE-BETAMETHASONE 1-0.05 % EX CREA: 1.0000 "application " | TOPICAL_CREAM | Freq: Two times a day (BID) | CUTANEOUS | 3 refills | Status: DC

## 2020-03-18 MED ORDER — METHYLPREDNISOLONE 4 MG PO TBPK: ORAL_TABLET | ORAL | 0 refills | Status: DC

## 2020-03-18 NOTE — Progress Notes (Signed)
Subjective:  Patient ID: Vanessa [name], female    DOB: 05-01-1960  Age: 59 y.o. MRN: [id number]  CC: Follow-up   HPI Vanessa [name] presents for hand eczema - not better w/gluten free diet and Triamcinolone cream. C/o L hand stiffness - new C/o constipation and diarrhea alternating x 2-3 wks. Last colon in 2014  Outpatient Medications Prior to Visit  Medication Sig Dispense Refill  . ALPRAZolam (XANAX) 0.25 MG tablet Take 1 tablet (0.25 mg total) by mouth 2 (two) times daily as needed. for anxiety 60 tablet 3  . aspirin 81 MG tablet Take 81 mg by mouth every other day.    . cholecalciferol (VITAMIN D3) 25 MCG (1000 UNIT) tablet Take 1,000 Units by mouth daily.    Marland Kitchen levothyroxine (SYNTHROID) 88 MCG tablet Take 1 tablet by mouth once daily 90 tablet 3  . triamcinolone ointment (KENALOG) 0.5 % Apply 1 application topically 4 (four) times daily. 90 g 1  . ciclopirox (PENLAC) 8 % solution Apply topically at bedtime. Apply over nail and surrounding skin. Apply daily over previous coat. After seven (7) days, may remove with alcohol and continue cycle. (Patient not taking: Reported on 03/18/2020) 6.6 mL 0   No facility-administered medications prior to visit.    ROS: Review of Systems  Constitutional: Negative for activity change, appetite change, chills, fatigue and unexpected weight change.  HENT: Negative for congestion, mouth sores and sinus pressure.   Eyes: Negative for visual disturbance.  Respiratory: Negative for cough and chest tightness.   Gastrointestinal: Positive for constipation and diarrhea. Negative for abdominal pain and nausea.  Genitourinary: Negative for difficulty urinating, frequency and vaginal pain.  Musculoskeletal: Positive for arthralgias. Negative for back pain and gait problem.  Skin: Positive for rash. Negative for pallor.  Neurological: Negative for dizziness, tremors, weakness, numbness and headaches.  Psychiatric/Behavioral: Negative for confusion and  sleep disturbance.    Objective:  BP 128/82 (BP Location: Left Arm)   Pulse 80   Temp 98.2 F (36.8 C) (Oral)   Wt 180 lb 12.8 oz (82 kg)   LMP 03/08/2006   SpO2 97%   BMI 32.54 kg/m   BP Readings from Last 3 Encounters:  03/18/20 128/82  02/17/20 132/84  10/21/19 126/80    Wt Readings from Last 3 Encounters:  03/18/20 180 lb 12.8 oz (82 kg)  02/17/20 181 lb (82.1 kg)  10/21/19 180 lb (81.6 kg)    Physical Exam Constitutional:      General: She is not in acute distress.    Appearance: She is well-developed.  HENT:     Head: Normocephalic.     Right Ear: External ear normal.     Left Ear: External ear normal.     Nose: Nose normal.     Mouth/Throat:     Mouth: Oropharynx is clear and moist.  Eyes:     General:        Right eye: No discharge.        Left eye: No discharge.     Conjunctiva/sclera: Conjunctivae normal.     Pupils: Pupils are equal, round, and reactive to light.  Neck:     Thyroid: No thyromegaly.     Vascular: No JVD.     Trachea: No tracheal deviation.  Cardiovascular:     Rate and Rhythm: Normal rate and regular rhythm.     Heart sounds: Normal heart sounds.  Pulmonary:     Effort: No respiratory distress.     Breath  sounds: No stridor. No wheezing.  Abdominal:     General: Bowel sounds are normal. There is no distension.     Palpations: Abdomen is soft. There is no mass.     Tenderness: There is no abdominal tenderness. There is no guarding or rebound.  Musculoskeletal:        General: Tenderness present. No edema.     Cervical back: Normal range of motion and neck supple.  Lymphadenopathy:     Cervical: No cervical adenopathy.  Skin:    Findings: Rash present. No erythema.  Neurological:     Cranial Nerves: No cranial nerve deficit.     Motor: No abnormal muscle tone.     Coordination: Coordination normal.     Deep Tendon Reflexes: Reflexes normal.  Psychiatric:        Mood and Affect: Mood and affect normal.        Behavior:  Behavior normal.        Thought Content: Thought content normal.        Judgment: Judgment normal.      Hand eczema, painful L hand  Lab Results  Component Value Date   WBC 13.6 (H) 10/21/2019   HGB 13.7 10/21/2019   HCT 40.8 10/21/2019   PLT 390 10/21/2019   GLUCOSE 89 10/21/2019   CHOL 291 (H) 10/21/2019   TRIG 217 (H) 10/21/2019   HDL 38 (L) 10/21/2019   LDLDIRECT 199.0 10/15/2018   LDLCALC 212 (H) 10/21/2019   ALT 15 10/21/2019   AST 18 10/21/2019   NA 139 10/21/2019   K 4.1 10/21/2019   CL 103 10/21/2019   CREATININE 1.13 (H) 10/21/2019   BUN 20 10/21/2019   CO2 24 10/21/2019   TSH 6.04 (H) 10/21/2019   HGBA1C 5.7 07/21/2015    No results found.  Assessment & Plan:   There are no diagnoses linked to this encounter.   No orders of the defined types were placed in this encounter.    Follow-up: No follow-ups on file.  Sonda Primes, MD

## 2020-03-18 NOTE — Addendum Note (Signed)
Addended by: Tessie Fass D on: 03/18/2020 03:12 PM   Modules accepted: Orders

## 2020-03-18 NOTE — Addendum Note (Signed)
Addended by: Earnstine Regal on: 03/18/2020 03:22 PM   Modules accepted: Orders

## 2020-03-18 NOTE — Assessment & Plan Note (Addendum)
Eczema vs psoriasis vs other: not better Medrol pack Lotrisone cream Triamc oint d/c Trial of gluten free - did not help in 4 weeks Derm ref

## 2020-03-18 NOTE — Assessment & Plan Note (Addendum)
New diarrhea/constipation GYN appt in Jan 2022 Gi ref  Trial of gluten free - did not help in 4 weeks

## 2020-03-18 NOTE — Assessment & Plan Note (Addendum)
On B12 - inject today

## 2020-03-19 LAB — URINALYSIS, ROUTINE W REFLEX MICROSCOPIC
Bilirubin Urine: NEGATIVE
Ketones, ur: NEGATIVE
Leukocytes,Ua: NEGATIVE
Nitrite: NEGATIVE
Specific Gravity, Urine: 1.025 (ref 1.000–1.030)
Total Protein, Urine: 30 — AB
Urine Glucose: NEGATIVE
Urobilinogen, UA: 0.2 (ref 0.0–1.0)
pH: 5.5 (ref 5.0–8.0)

## 2020-03-19 LAB — RHEUMATOID FACTOR: Rheumatoid fact SerPl-aCnc: <14 IU/mL (ref <14)

## 2020-03-23 ENCOUNTER — Other Ambulatory Visit: Payer: Self-pay | Admitting: Internal Medicine

## 2020-03-23 ENCOUNTER — Telehealth: Payer: Self-pay | Admitting: Internal Medicine

## 2020-03-23 DIAGNOSIS — R944 Abnormal results of kidney function studies: Secondary | ICD-10-CM

## 2020-03-23 NOTE — Telephone Encounter (Signed)
Results as been given to the patient.

## 2020-03-23 NOTE — Telephone Encounter (Signed)
   Patient requesting return call today to discuss lab results

## 2020-03-23 NOTE — Progress Notes (Signed)
Renal us

## 2020-03-26 NOTE — Progress Notes (Deleted)
   I, Philbert Riser, LAT, ATC acting as a scribe for Clementeen Graham, MD.  Subjective:    CC: Pain in left hand and 3rd finger  HPI: Pt is a 59yo female c/o pain in L hand and 3rd finger. Pt reports pain has been ongoing for //. MOI? Pt locates pain to //  Aggravates: Rx tried:  Pertinent review of Systems: ***  Relevant historical information: ***   Objective:   There were no vitals filed for this visit. General: Well Developed, well nourished, and in no acute distress.   MSK: ***  Lab and Radiology Results No results found for this or any previous visit (from the past 72 hour(s)). No results found.    Impression and Recommendations:    Assessment and Plan: 59 y.o. female with ***.  PDMP not reviewed this encounter. No orders of the defined types were placed in this encounter.  No orders of the defined types were placed in this encounter.   Discussed warning signs or symptoms. Please see discharge instructions. Patient expresses understanding.   ***

## 2020-03-30 ENCOUNTER — Ambulatory Visit (HOSPITAL_COMMUNITY): Admit: 2020-03-30 | Discharge status: Against Medical Advice | Payer: BC Managed Care – PPO

## 2020-03-30 ENCOUNTER — Other Ambulatory Visit: Payer: BC Managed Care – PPO

## 2020-03-30 ENCOUNTER — Ambulatory Visit: Payer: BC Managed Care – PPO | Admitting: Family Medicine

## 2020-04-01 ENCOUNTER — Other Ambulatory Visit: Payer: Self-pay

## 2020-04-01 ENCOUNTER — Other Ambulatory Visit: Payer: BC Managed Care – PPO

## 2020-04-01 ENCOUNTER — Ambulatory Visit
Admission: EM | Admit: 2020-04-01 | Discharge: 2020-04-01 | Disposition: A | Discharge status: Home / Self Care | Payer: BC Managed Care – PPO | Attending: Internal Medicine | Admitting: Internal Medicine

## 2020-04-01 DIAGNOSIS — Z20822 Contact with and (suspected) exposure to covid-19: Secondary | ICD-10-CM

## 2020-04-01 DIAGNOSIS — B352 Tinea manuum: Secondary | ICD-10-CM

## 2020-04-01 DIAGNOSIS — J0100 Acute maxillary sinusitis, unspecified: Secondary | ICD-10-CM

## 2020-04-01 MED ORDER — TERBINAFINE HCL 250 MG PO TABS: 250.0000 mg | ORAL_TABLET | Freq: Every day | ORAL | 0 refills | Status: DC

## 2020-04-01 MED ORDER — FLUTICASONE PROPIONATE 50 MCG/ACT NA SUSP: 1.0000 | Freq: Every day | NASAL | 0 refills | Status: DC

## 2020-04-01 NOTE — Discharge Instructions (Signed)
Use medication as directed If symptoms worsen, please return to the urgent care to be re-evaluated.

## 2020-04-01 NOTE — ED Triage Notes (Signed)
Pt here for URI and cough x 3 days; denies fever

## 2020-04-03 ENCOUNTER — Encounter: Payer: BC Managed Care – PPO | Admitting: Obstetrics and Gynecology

## 2020-04-03 NOTE — ED Provider Notes (Signed)
[facility]    CSN: [id number] Arrival date & time: 04/01/20  0831      History   Chief Complaint Chief Complaint  Patient presents with  . Cough  . URI    HPI ILLYANNA [name] is a 60 y.o. female comes to the urgent care with a 3-day history of upper respiratory infection symptoms and a cough which is nonproductive of sputum.  Patient has some seasonal allergies.  She has been using over-the-counter medications with no improvement.  She has not tried any nasal spray.  Patient denies any loss of smell or taste.  No body aches.  She denies any fever or chills.  She is fully vaccinated against COVID-19 virus.  Patient admits to nasal congestion with some itchy nose.  No itchy eyes.  Patient has a rash in the hands which is itchy.  She has been treated with topical antifungal and a course of steroids with partial improvement in his symptoms.  Rash is worsened over the past few weeks. HPI  Past Medical History:  Diagnosis Date  . Allergy    rhinitis  . Anxiety   . B12 deficiency   . Cyst    gangeous- left hand  . Depression   . Hyperlipidemia   . Hypothyroidism   . Tobacco abuse   . Vitamin D deficiency 11/2017   Value 21    Patient Active Problem List   Diagnosis Date Noted  . Bowel habit changes 03/18/2020  . Hand eczema 02/17/2020  . Plantar fasciitis, right 12/07/2018  . Flank pain 02/02/2018  . Acute pain of right shoulder 12/17/2017  . Strep throat 09/17/2016  . Tick bite of back 09/17/2016  . Menopause 03/25/2016  . Essential hypertension 07/21/2015  . Vitamin D deficiency 07/21/2015  . Cough 06/28/2015  . Trigger thumb of left hand 11/20/2014  . Trigger thumb 04/23/2014  . Rash and nonspecific skin eruption 04/23/2014  . Abscess of skin of abdomen 04/09/2014  . Onychomycosis 04/09/2014  . Pain of right thumb 04/04/2014  . Smoker 06/26/2013  . Eye pain 11/09/2012  . Right-sided chest wall pain 09/11/2012  . Well adult exam 12/28/2010  .  PSORIASIS 02/02/2010  . SINUSITIS, ACUTE 08/28/2009  . B12 deficiency 02/10/2009  . Anxiety state 08/11/2008  . DEPRESSION 08/11/2008  . Acute upper respiratory infection 06/03/2008  . INSOMNIA, PERSISTENT 02/14/2008  . PHARYNGITIS 02/14/2008  . FATIGUE 02/14/2008  . SWEATING 02/14/2008  . TOBACCO USE DISORDER/SMOKER-SMOKING CESSATION DISCUSSED 06/08/2007  . Hypothyroidism 03/07/2007  . Allergic rhinitis 03/07/2007  . WEIGHT GAIN 03/07/2007  . ABNORMAL GLUCOSE NEC 03/07/2007  . Dyslipidemia 10/27/2006    Past Surgical History:  Procedure Laterality Date  . CESAREAN SECTION  1992  . EYE SURGERY    . GANGLION CYST EXCISION  2009   right ankle    OB History    Gravida  2   Para  2   Term      Preterm      AB      Living  2     SAB      IAB      Ectopic      Multiple      Live Births               Home Medications    Prior to Admission medications   Medication Sig Start Date End Date Taking? Authorizing Provider  fluticasone (FLONASE) 50 MCG/ACT nasal spray Place 1 spray into both nostrils  daily. 04/01/20  Yes Karima Carrell, Myrene Galas, MD  terbinafine (LAMISIL) 250 MG tablet Take 1 tablet (250 mg total) by mouth daily for 14 days. 04/01/20 04/15/20 Yes Alette Kataoka, Myrene Galas, MD  ALPRAZolam Duanne Moron) 0.25 MG tablet Take 1 tablet (0.25 mg total) by mouth 2 (two) times daily as needed. for anxiety 10/21/19   Plotnikov, Evie Lacks, MD  aspirin 81 MG tablet Take 81 mg by mouth every other day.    [provider]  cholecalciferol (VITAMIN D3) 25 MCG (1000 UNIT) tablet Take 1,000 Units by mouth daily.    [provider]  ciclopirox (PENLAC) 8 % solution Apply topically at bedtime. Apply over nail and surrounding skin. Apply daily over previous coat. After seven (7) days, may remove with alcohol and continue cycle. Patient not taking: Reported on 03/18/2020 02/17/20   Plotnikov, Evie Lacks, MD  clotrimazole-betamethasone (LOTRISONE) cream Apply 1 application  topically 2 (two) times daily. 03/18/20 03/18/21  Plotnikov, Evie Lacks, MD  levothyroxine (SYNTHROID) 88 MCG tablet Take 1 tablet by mouth once daily 12/09/19   Plotnikov, Evie Lacks, MD  methylPREDNISolone (MEDROL DOSEPAK) 4 MG TBPK tablet As directed Patient not taking: Reported on 04/01/2020 03/18/20   Plotnikov, Evie Lacks, MD    Family History Family History  Problem Relation Age of Onset  . Thyroid disease Mother        Low  . Other Mother        B12 deficiency  . Heart disease Father 29       CAD  . Coronary artery disease Other   . Hyperlipidemia Other   . Psoriasis Son   . Colon cancer Neg Hx     Social History Social History   Tobacco Use  . Smoking status: Current Every Day Smoker    Packs/day: 0.25    Types: Cigarettes  . Smokeless tobacco: Never Used  Vaping Use  . Vaping Use: Never used  Substance Use Topics  . Alcohol use: Yes    Alcohol/week: 0.0 standard drinks    Comment: Rare  . Drug use: No     Allergies   Atorvastatin, Crestor [rosuvastatin calcium], Ezetimibe-simvastatin, and Hydrocodone-homatropine   Review of Systems Review of Systems  HENT: Positive for congestion, rhinorrhea, sinus pressure and sneezing. Negative for sore throat.   Respiratory: Positive for cough.   Gastrointestinal: Negative for diarrhea, nausea and vomiting.  Genitourinary: Negative.   Skin: Positive for color change and rash. Negative for wound.  Neurological: Negative for headaches.  Psychiatric/Behavioral: Negative for confusion.     Physical Exam Triage Vital Signs ED Triage Vitals  Enc Vitals Group     BP 04/01/20 0859 (!) 145/91     Pulse Rate 04/01/20 0859 71     Resp 04/01/20 0859 17     Temp 04/01/20 0859 98 F (36.7 C)     Temp src --      SpO2 04/01/20 0859 98 %     Weight --      Height --      Head Circumference --      Peak Flow --      Pain Score 04/01/20 0906 5     Pain Loc --      Pain Edu? --      Excl. in [place]? --    No data  found.  Updated Vital Signs BP (!) 145/91   Pulse 71   Temp 98 F (36.7 C)   Resp 17   LMP 03/08/2006  SpO2 98%   Visual Acuity Right Eye Distance:   Left Eye Distance:   Bilateral Distance:    Right Eye Near:   Left Eye Near:    Bilateral Near:     Physical Exam Vitals and nursing note reviewed.  Constitutional:      General: She is not in acute distress.    Appearance: She is not ill-appearing.  HENT:     Right Ear: Tympanic membrane normal.     Left Ear: Tympanic membrane normal.     Mouth/Throat:     Pharynx: Posterior oropharyngeal erythema present.  Cardiovascular:     Rate and Rhythm: Normal rate and regular rhythm.  Pulmonary:     Effort: Pulmonary effort is normal.     Breath sounds: Normal breath sounds. No stridor. No wheezing or rhonchi.  Abdominal:     General: Abdomen is flat. Bowel sounds are normal.  Skin:    Comments: Bilateral rash over the palmar surface of both hands.  Mild erythema with excoriations.  Few papular lesions circumferentially.  No central clearing  Neurological:     Mental Status: She is alert.      UC Treatments / Results  Labs (all labs ordered are listed, but only abnormal results are displayed) Labs Reviewed  NOVEL CORONAVIRUS, NAA    EKG   Radiology No results found.  Procedures Procedures (including critical care time)  Medications Ordered in UC Medications - No data to display  Initial Impression / Assessment and Plan / UC Course  I have reviewed the triage vital signs and the nursing notes.  Pertinent labs & imaging results that were available during my care of the patient were reviewed by me and considered in my medical decision making (see chart for details).     1.  Acute nonrecurrent maxillary sinusitis: Fluticasone nasal spray I offered the patient a short course of steroids but she declined. Continue using other over-the-counter medication I recommend an antihistamine-nonsedating COVID-19 PCR  test has been sent Please quarantine until COVID-19 test results are available If symptoms worsen please return to the urgent care to be reevaluated.  2.  Tinea Manuum: Lamisil 250 mg orally daily for 14 days If symptoms persist please return to the urgent care to be reevaluated  Final Clinical Impressions(s) / UC Diagnoses   Final diagnoses:  Encounter for screening laboratory testing for COVID-19 virus  Acute non-recurrent maxillary sinusitis  Tinea manuum     Discharge Instructions     Use medication as directed If symptoms worsen, please return to the urgent care to be re-evaluated.   ED Prescriptions    Medication Sig Dispense Auth. Provider   fluticasone (FLONASE) 50 MCG/ACT nasal spray Place 1 spray into both nostrils daily. 16 g Jabaree Mercado, Myrene Galas, MD   terbinafine (LAMISIL) 250 MG tablet Take 1 tablet (250 mg total) by mouth daily for 14 days. 14 tablet Desira Alessandrini, Myrene Galas, MD     PDMP not reviewed this encounter.   Chase Picket, MD 04/03/20 1728

## 2020-04-04 LAB — NOVEL CORONAVIRUS, NAA: SARS-CoV-2, NAA: NOT DETECTED

## 2020-04-08 ENCOUNTER — Ambulatory Visit
Admission: RE | Admit: 2020-04-08 | Discharge: 2020-04-08 | Disposition: A | Discharge status: Home / Self Care | Payer: BC Managed Care – PPO | Source: Ambulatory Visit | Attending: Internal Medicine | Admitting: Internal Medicine

## 2020-04-08 DIAGNOSIS — R944 Abnormal results of kidney function studies: Secondary | ICD-10-CM

## 2020-04-09 ENCOUNTER — Telehealth: Payer: Self-pay | Admitting: Internal Medicine

## 2020-04-09 NOTE — Telephone Encounter (Signed)
Patient saw her US renal results from yesterday and doesn't understand what any of it means, would like someone to call her and discuss it with her.  [phone]

## 2020-04-10 ENCOUNTER — Other Ambulatory Visit: Payer: Self-pay | Admitting: *Deleted

## 2020-04-10 ENCOUNTER — Ambulatory Visit (INDEPENDENT_AMBULATORY_CARE_PROVIDER_SITE_OTHER): Payer: BC Managed Care – PPO | Admitting: Obstetrics and Gynecology

## 2020-04-10 ENCOUNTER — Other Ambulatory Visit: Payer: Self-pay | Admitting: Internal Medicine

## 2020-04-10 ENCOUNTER — Encounter: Payer: Self-pay | Admitting: Obstetrics and Gynecology

## 2020-04-10 ENCOUNTER — Other Ambulatory Visit: Payer: Self-pay

## 2020-04-10 VITALS — BP 122/80 | Ht 62.0 in | Wt 193.0 lb

## 2020-04-10 DIAGNOSIS — Z124 Encounter for screening for malignant neoplasm of cervix: Secondary | ICD-10-CM

## 2020-04-10 DIAGNOSIS — Z01419 Encounter for gynecological examination (general) (routine) without abnormal findings: Secondary | ICD-10-CM

## 2020-04-10 DIAGNOSIS — Z13820 Encounter for screening for osteoporosis: Secondary | ICD-10-CM

## 2020-04-10 DIAGNOSIS — E559 Vitamin D deficiency, unspecified: Secondary | ICD-10-CM | POA: Diagnosis not present

## 2020-04-10 DIAGNOSIS — R944 Abnormal results of kidney function studies: Secondary | ICD-10-CM

## 2020-04-10 MED ORDER — TRIAMCINOLONE ACETONIDE 0.5 % EX OINT: TOPICAL_OINTMENT | Freq: Four times a day (QID) | CUTANEOUS | 1 refills | Status: DC

## 2020-04-10 NOTE — Progress Notes (Signed)
Kynsley Whitehouse OFBPZW 1961-01-08 [id number]  SUBJECTIVE:  60 y.o. G2P2 female for annual routine gynecologic exam and Pap smear. She has no gynecologic concerns.  Current Outpatient Medications  Medication Sig Dispense Refill  . ALPRAZolam (XANAX) 0.25 MG tablet Take 1 tablet (0.25 mg total) by mouth 2 (two) times daily as needed. for anxiety 60 tablet 3  . aspirin 81 MG tablet Take 81 mg by mouth every other day.    . cholecalciferol (VITAMIN D3) 25 MCG (1000 UNIT) tablet Take 1,000 Units by mouth daily.    . ciclopirox (PENLAC) 8 % solution Apply topically at bedtime. Apply over nail and surrounding skin. Apply daily over previous coat. After seven (7) days, may remove with alcohol and continue cycle. 6.6 mL 0  . clotrimazole-betamethasone (LOTRISONE) cream Apply 1 application topically 2 (two) times daily. 90 g 3  . fluticasone (FLONASE) 50 MCG/ACT nasal spray Place 1 spray into both nostrils daily. 16 g 0  . levothyroxine (SYNTHROID) 88 MCG tablet Take 1 tablet by mouth once daily 90 tablet 3  . methylPREDNISolone (MEDROL DOSEPAK) 4 MG TBPK tablet As directed 21 tablet 0   No current facility-administered medications for this visit.   Allergies: Atorvastatin, Crestor [rosuvastatin calcium], Ezetimibe-simvastatin, and Hydrocodone-homatropine  Patient's last menstrual period was 03/08/2006.  Past medical history,surgical history, problem list, medications, allergies, family history and social history were all reviewed and documented as reviewed in the EPIC chart.  ROS: Pertinent positives and negatives as reviewed in HPI    OBJECTIVE:  BP 122/80 (BP Location: Right Arm, Patient Position: Sitting, Cuff Size: Normal)   Ht 5\' 2"  (1.575 m)   Wt 193 lb (87.5 kg)   LMP 03/08/2006   BMI 35.30 kg/m  The patient appears well, alert, oriented, in no distress. ENT normal.  Neck supple. No cervical or supraclavicular adenopathy or thyromegaly.  Lungs are clear, good air entry, no wheezes,  rhonchi or rales. S1 and S2 normal, no murmurs, regular rate and rhythm.  Abdomen soft without tenderness, guarding, mass or organomegaly.  Neurological is normal, no focal findings.  BREAST EXAM: breasts appear normal, no suspicious masses, no skin or nipple changes or axillary nodes  PELVIC EXAM: VULVA: normal appearing vulva with atrophic change, no masses, tenderness or lesions, VAGINA: normal appearing vagina with trophic change, normal color and discharge, no lesions, CERVIX: normal appearing cervix without discharge or lesions, UTERUS: uterus is normal size, shape, consistency and nontender, ADNEXA: normal adnexa in size, nontender and no masses, PAP: Pap smear done today, thin-prep method  Chaperone: Wandra Scot Bonham present during the examination  ASSESSMENT:  60 y.o. G2P2 here for annual gynecologic exam  PLAN:   1. Postmenopausal.  No significant hot flashes or night sweats.  Symptoms have calmed much since her last visit here.  No vaginal bleeding. 2. Pap smear 02/2016.  She denies history of abnormal Pap smears.  Pap smear collected today. 3. Mammogram 01/2020.  Normal breast exam today.  Continue with annual mammograms.. 4. Colonoscopy 2014.  Follow up at the recommended interval.  5.  Discussed recommendation for osteoporosis screening with DEXA as she approaches age 23.  She plans to schedule this.  Risk factor with smoking.  Also with history of vitamin D deficiency, not currently supplementing.  We will check vitamin D level today.  The patient is aware that I will only be at this practice until early March 2022 so she knows to make sure she requests follow-up on the results if testing  is not done while I am still at the practice. 6. Health maintenance.  No labs today as she normally has these completed with her primary care doctor.  Return annually or sooner, prn.  Joseph Pierini MD 04/10/20

## 2020-04-11 LAB — VITAMIN D 25 HYDROXY (VIT D DEFICIENCY, FRACTURES): Vit D, 25-Hydroxy: 35 ng/mL (ref 30–100)

## 2020-04-12 NOTE — Telephone Encounter (Signed)
I am not sure if [name] saw my comments- no need to do anything now.  Please keep a follow-up appointment.  Thanks ---------------------------------- Dear Vanessa [name], Your kidney ultrasound reveals no urinary obstruction. Your kidney tissue appears somewhat dense on the ultrasound. Please do your blood work and a urine test in 1 month and see me with results. Hydrate yourself well to ensure a good kidney perfusion. Sincerely, AP

## 2020-04-13 LAB — PAP IG W/ RFLX HPV ASCU

## 2020-04-17 ENCOUNTER — Other Ambulatory Visit: Payer: Self-pay

## 2020-04-20 ENCOUNTER — Ambulatory Visit (INDEPENDENT_AMBULATORY_CARE_PROVIDER_SITE_OTHER): Payer: BC Managed Care – PPO

## 2020-04-20 ENCOUNTER — Telehealth: Payer: Self-pay

## 2020-04-20 ENCOUNTER — Other Ambulatory Visit: Payer: Self-pay

## 2020-04-20 DIAGNOSIS — E538 Deficiency of other specified B group vitamins: Secondary | ICD-10-CM | POA: Diagnosis not present

## 2020-04-20 MED ORDER — CYANOCOBALAMIN 1000 MCG/ML IJ SOLN
1000.0000 ug | Freq: Once | INTRAMUSCULAR | Status: AC
  Administered 2020-04-20: 1000 ug via INTRAMUSCULAR

## 2020-04-20 NOTE — Telephone Encounter (Signed)
Order clarification needed for B12 injections.

## 2020-04-20 NOTE — Progress Notes (Addendum)
Pt here for monthly B12 injection per Dr Alain Marion  B12 1028mcg given IM right deltoid and pt tolerated injection well.  Next B12 injection scheduled for 05/25/20.   Medical screening examination/treatment/procedure(s) were performed by non-physician practitioner and as supervising physician I was immediately available for consultation/collaboration.  I agree with above. Lew Dawes, MD

## 2020-04-20 NOTE — Telephone Encounter (Signed)
Okay B12 injections.  Diagnosis B12 deficiency.  Thanks

## 2020-05-08 ENCOUNTER — Other Ambulatory Visit: Payer: BC Managed Care – PPO

## 2020-05-12 ENCOUNTER — Other Ambulatory Visit: Payer: Self-pay | Admitting: Internal Medicine

## 2020-05-13 NOTE — Telephone Encounter (Signed)
1.Medication Requested: ALPRAZolam (XANAX) 0.25 MG tablet    2. Pharmacy (Name, Street, [place]): [place], [address]  3. On Med List: yes   4. Last Visit with PCP: 12.14.21  5. Next visit date with PCP: 2.28.22   Agent: Please be advised that RX refills may take up to 3 business days. We ask that you follow-up with your pharmacy.

## 2020-05-14 ENCOUNTER — Other Ambulatory Visit (INDEPENDENT_AMBULATORY_CARE_PROVIDER_SITE_OTHER): Payer: BC Managed Care – PPO

## 2020-05-14 ENCOUNTER — Other Ambulatory Visit: Payer: Self-pay | Admitting: Internal Medicine

## 2020-05-14 DIAGNOSIS — R944 Abnormal results of kidney function studies: Secondary | ICD-10-CM | POA: Diagnosis not present

## 2020-05-14 LAB — BASIC METABOLIC PANEL
BUN: 26 mg/dL — ABNORMAL HIGH (ref 6–23)
CO2: 27 mEq/L (ref 19–32)
Calcium: 9.3 mg/dL (ref 8.4–10.5)
Chloride: 104 mEq/L (ref 96–112)
Creatinine, Ser: 1.23 mg/dL — ABNORMAL HIGH (ref 0.40–1.20)
GFR: 47.93 mL/min — ABNORMAL LOW (ref >60.00)
Glucose, Bld: 124 mg/dL — ABNORMAL HIGH (ref 70–99)
Potassium: 3.8 mEq/L (ref 3.5–5.1)
Sodium: 137 mEq/L (ref 135–145)

## 2020-05-14 LAB — MICROALBUMIN / CREATININE URINE RATIO
Creatinine,U: 127 mg/dL
Microalb Creat Ratio: 31.2 mg/g — ABNORMAL HIGH (ref 0.0–30.0)
Microalb, Ur: 39.7 mg/dL — ABNORMAL HIGH (ref 0.0–1.9)

## 2020-05-14 LAB — URINALYSIS, ROUTINE W REFLEX MICROSCOPIC
Bilirubin Urine: NEGATIVE
Ketones, ur: NEGATIVE
Leukocytes,Ua: NEGATIVE
Nitrite: NEGATIVE
Specific Gravity, Urine: >=1.03 — AB (ref 1.000–1.030)
Total Protein, Urine: 100 — AB
Urine Glucose: NEGATIVE
Urobilinogen, UA: 0.2 (ref 0.0–1.0)
pH: 6 (ref 5.0–8.0)

## 2020-05-15 NOTE — Telephone Encounter (Signed)
Patient is checking on the status of the refill

## 2020-05-19 ENCOUNTER — Other Ambulatory Visit: Payer: Self-pay | Admitting: Internal Medicine

## 2020-05-19 DIAGNOSIS — N289 Disorder of kidney and ureter, unspecified: Secondary | ICD-10-CM

## 2020-05-19 DIAGNOSIS — R809 Proteinuria, unspecified: Secondary | ICD-10-CM | POA: Insufficient documentation

## 2020-05-25 ENCOUNTER — Encounter: Payer: Self-pay | Admitting: Internal Medicine

## 2020-05-25 ENCOUNTER — Other Ambulatory Visit: Payer: Self-pay

## 2020-05-25 ENCOUNTER — Ambulatory Visit: Payer: BC Managed Care – PPO

## 2020-05-25 ENCOUNTER — Ambulatory Visit: Payer: BC Managed Care – PPO | Admitting: Internal Medicine

## 2020-05-25 DIAGNOSIS — E538 Deficiency of other specified B group vitamins: Secondary | ICD-10-CM

## 2020-05-25 DIAGNOSIS — I10 Essential (primary) hypertension: Secondary | ICD-10-CM | POA: Diagnosis not present

## 2020-05-25 DIAGNOSIS — N289 Disorder of kidney and ureter, unspecified: Secondary | ICD-10-CM

## 2020-05-25 DIAGNOSIS — E559 Vitamin D deficiency, unspecified: Secondary | ICD-10-CM

## 2020-05-25 DIAGNOSIS — L309 Dermatitis, unspecified: Secondary | ICD-10-CM

## 2020-05-25 MED ORDER — CYANOCOBALAMIN 1000 MCG/ML IJ SOLN
1000.0000 ug | Freq: Once | INTRAMUSCULAR | Status: AC
  Administered 2020-05-25: 1000 ug via INTRAMUSCULAR

## 2020-05-25 NOTE — Assessment & Plan Note (Signed)
Cont w/Vit D 

## 2020-05-25 NOTE — Assessment & Plan Note (Addendum)
On B12 inj D/c SL Vit B12 that Charle was taing as an extra

## 2020-05-25 NOTE — Assessment & Plan Note (Signed)
Off Maxzide

## 2020-05-25 NOTE — Progress Notes (Signed)
Subjective:  Patient ID: Vanessa [name], female    DOB: 04/02/1960  Age: 60 y.o. MRN: [id number]  CC: Follow-up   HPI DYAMON SOSINSKI presents for ARI C/o anxiety about renal tests  Outpatient Medications Prior to Visit  Medication Sig Dispense Refill  . ALPRAZolam (XANAX) 0.25 MG tablet Take 1 tablet by mouth twice daily as needed for anxiety 60 tablet 1  . aspirin 81 MG tablet Take 81 mg by mouth every other day.    . ciclopirox (PENLAC) 8 % solution Apply topically at bedtime. Apply over nail and surrounding skin. Apply daily over previous coat. After seven (7) days, may remove with alcohol and continue cycle. 6.6 mL 0  . levothyroxine (SYNTHROID) 88 MCG tablet Take 1 tablet by mouth once daily 90 tablet 3  . cholecalciferol (VITAMIN D3) 25 MCG (1000 UNIT) tablet Take 1,000 Units by mouth daily. (Patient not taking: Reported on 05/25/2020)    . clotrimazole-betamethasone (LOTRISONE) cream Apply 1 application topically 2 (two) times daily. 90 g 3  . fluticasone (FLONASE) 50 MCG/ACT nasal spray Place 1 spray into both nostrils daily. (Patient not taking: Reported on 05/25/2020) 16 g 0  . triamcinolone ointment (KENALOG) 0.5 % Apply topically 4 (four) times daily. (Patient not taking: Reported on 05/25/2020) 30 g 1   No facility-administered medications prior to visit.    ROS: Review of Systems  Constitutional: Negative for activity change, appetite change, chills, fatigue and unexpected weight change.  HENT: Negative for congestion, mouth sores and sinus pressure.   Eyes: Negative for visual disturbance.  Respiratory: Negative for cough and chest tightness.   Gastrointestinal: Negative for abdominal pain and nausea.  Genitourinary: Negative for difficulty urinating, frequency and vaginal pain.  Musculoskeletal: Negative for back pain and gait problem.  Skin: Negative for pallor and rash.  Neurological: Negative for dizziness, tremors, weakness, numbness and headaches.   Psychiatric/Behavioral: Negative for confusion and sleep disturbance.    Objective:  BP 132/90 (BP Location: Left Arm)   Pulse 99   Temp 98.4 F (36.9 C) (Oral)   Ht 5\' 2"  (1.575 m)   Wt 180 lb 6.4 oz (81.8 kg)   LMP 03/08/2006   SpO2 98%   BMI 33.00 kg/m   BP Readings from Last 3 Encounters:  05/25/20 132/90  04/10/20 122/80  04/01/20 (!) 145/91    Wt Readings from Last 3 Encounters:  05/25/20 180 lb 6.4 oz (81.8 kg)  04/10/20 193 lb (87.5 kg)  03/18/20 180 lb 12.8 oz (82 kg)    Physical Exam Constitutional:      General: She is not in acute distress.    Appearance: She is well-developed.  HENT:     Head: Normocephalic.     Right Ear: External ear normal.     Left Ear: External ear normal.     Nose: Nose normal.     Mouth/Throat:     Mouth: Oropharynx is clear and moist.  Eyes:     General:        Right eye: No discharge.        Left eye: No discharge.     Conjunctiva/sclera: Conjunctivae normal.     Pupils: Pupils are equal, round, and reactive to light.  Neck:     Thyroid: No thyromegaly.     Vascular: No JVD.     Trachea: No tracheal deviation.  Cardiovascular:     Rate and Rhythm: Normal rate and regular rhythm.     Heart sounds: Normal  heart sounds.  Pulmonary:     Effort: No respiratory distress.     Breath sounds: No stridor. No wheezing.  Abdominal:     General: Bowel sounds are normal. There is no distension.     Palpations: Abdomen is soft. There is no mass.     Tenderness: There is no abdominal tenderness. There is no guarding or rebound.  Musculoskeletal:        General: No tenderness or edema.     Cervical back: Normal range of motion and neck supple.  Lymphadenopathy:     Cervical: No cervical adenopathy.  Skin:    Findings: No erythema or rash.  Neurological:     Cranial Nerves: No cranial nerve deficit.     Motor: No abnormal muscle tone.     Coordination: Coordination normal.     Deep Tendon Reflexes: Reflexes normal.   Psychiatric:        Mood and Affect: Mood and affect normal.        Behavior: Behavior normal.        Thought Content: Thought content normal.        Judgment: Judgment normal.    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  Lab Results  Component Value Date   WBC 11.8 (H) 03/18/2020   HGB 14.3 03/18/2020   HCT 42.6 03/18/2020   PLT 343.0 03/18/2020   GLUCOSE 124 (H) 05/14/2020   CHOL 291 (H) 10/21/2019   TRIG 217 (H) 10/21/2019   HDL 38 (L) 10/21/2019   LDLDIRECT 199.0 10/15/2018   LDLCALC 212 (H) 10/21/2019   ALT 15 03/18/2020   AST 17 03/18/2020   NA 137 05/14/2020   K 3.8 05/14/2020   CL 104 05/14/2020   CREATININE 1.23 (H) 05/14/2020   BUN 26 (H) 05/14/2020   CO2 27 05/14/2020   TSH 3.59 03/18/2020   HGBA1C 5.7 07/21/2015   MICROALBUR 39.7 (H) 05/14/2020    US RENAL  Result Date: 04/08/2020 CLINICAL DATA:  Decreasing GFR EXAM: RENAL / URINARY TRACT ULTRASOUND COMPLETE COMPARISON:  None. FINDINGS: Right Kidney: Renal measurements: 10.2 x 5.3 x 5.2 cm = volume: 145 mL. Diffusely increased echogenicity. No hydronephrosis. 1.3 x 1.1 x 0.8 cm interpolar renal cyst. Left Kidney: Renal measurements: 10.8 x 5.3 x 4.6 cm = volume: 136 mL. Increased echogenicity. No mass or hydronephrosis visualized. Bladder: Appears normal for degree of bladder distention. Bilateral ureteral jets are visualized. Other: None. IMPRESSION: 1. No hydronephrosis. 2. Diffusely increased bilateral renal echogenicity, as can be seen with medical renal disease. Electronically Signed   By: Dahlia Bailiff MD   On: 04/08/2020 21:07    Assessment & Plan:   There are no diagnoses linked to this encounter.   No orders of the defined types were placed in this encounter.    Follow-up: No follow-ups on file.  Walker Kehr, MD

## 2020-05-25 NOTE — Assessment & Plan Note (Signed)
Nephrology appt tomorrow

## 2020-05-25 NOTE — Patient Instructions (Signed)
Try GLYTONE exfoliating body lotion by Pierre Fabre (free acid value 17.5)  

## 2020-05-25 NOTE — Addendum Note (Signed)
Addended by: Earnstine Regal on: 05/25/2020 04:09 PM   Modules accepted: Orders

## 2020-05-25 NOTE — Assessment & Plan Note (Addendum)
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

## 2020-06-24 ENCOUNTER — Other Ambulatory Visit: Payer: Self-pay

## 2020-06-24 ENCOUNTER — Ambulatory Visit (INDEPENDENT_AMBULATORY_CARE_PROVIDER_SITE_OTHER): Payer: BC Managed Care – PPO

## 2020-06-24 DIAGNOSIS — E538 Deficiency of other specified B group vitamins: Secondary | ICD-10-CM | POA: Diagnosis not present

## 2020-06-24 MED ORDER — CYANOCOBALAMIN 1000 MCG/ML IJ SOLN
1000.0000 ug | INTRAMUSCULAR | Status: AC
  Administered 2020-06-24 – 2021-06-09 (×6): 1000 ug via INTRAMUSCULAR

## 2020-06-24 NOTE — Progress Notes (Addendum)
Pt here for monthly B12 injection per Dr Plotnikov.  B12 1000mcg given IM right deltoid and pt tolerated injection well.  Next B12 injection scheduled for 07/22/20.  Medical screening examination/treatment/procedure(s) were performed by non-physician practitioner and as supervising physician I was immediately available for consultation/collaboration.  I agree with above. Aleksei Plotnikov, MD  

## 2020-07-22 ENCOUNTER — Ambulatory Visit: Payer: BC Managed Care – PPO

## 2020-07-23 ENCOUNTER — Other Ambulatory Visit: Payer: Self-pay

## 2020-07-23 ENCOUNTER — Ambulatory Visit (INDEPENDENT_AMBULATORY_CARE_PROVIDER_SITE_OTHER): Payer: BC Managed Care – PPO

## 2020-07-23 DIAGNOSIS — E538 Deficiency of other specified B group vitamins: Secondary | ICD-10-CM | POA: Diagnosis not present

## 2020-07-23 NOTE — Progress Notes (Addendum)
Pt here for monthly B12 injection per Dr. Alain Marion  B12 1046mcg given right deltoid IM, and pt tolerated injection well.  Next B12 injection scheduled for 08/25/20   Medical screening examination/treatment/procedure(s) were performed by non-physician practitioner and as supervising physician I was immediately available for consultation/collaboration.  I agree with above. Lew Dawes, MD

## 2020-08-13 ENCOUNTER — Other Ambulatory Visit: Payer: Self-pay

## 2020-08-13 ENCOUNTER — Encounter (INDEPENDENT_AMBULATORY_CARE_PROVIDER_SITE_OTHER): Payer: Self-pay | Admitting: Family Medicine

## 2020-08-13 ENCOUNTER — Ambulatory Visit (INDEPENDENT_AMBULATORY_CARE_PROVIDER_SITE_OTHER): Payer: BC Managed Care – PPO | Admitting: Family Medicine

## 2020-08-13 VITALS — BP 144/84 | HR 73 | Temp 97.7°F | Ht 62.0 in | Wt 178.0 lb

## 2020-08-13 DIAGNOSIS — E782 Mixed hyperlipidemia: Secondary | ICD-10-CM

## 2020-08-13 DIAGNOSIS — Z1331 Encounter for screening for depression: Secondary | ICD-10-CM | POA: Diagnosis not present

## 2020-08-13 DIAGNOSIS — Z9189 Other specified personal risk factors, not elsewhere classified: Secondary | ICD-10-CM | POA: Diagnosis not present

## 2020-08-13 DIAGNOSIS — R5383 Other fatigue: Secondary | ICD-10-CM | POA: Diagnosis not present

## 2020-08-13 DIAGNOSIS — E559 Vitamin D deficiency, unspecified: Secondary | ICD-10-CM | POA: Diagnosis not present

## 2020-08-13 DIAGNOSIS — Z6832 Body mass index (BMI) 32.0-32.9, adult: Secondary | ICD-10-CM

## 2020-08-13 DIAGNOSIS — R0602 Shortness of breath: Secondary | ICD-10-CM | POA: Diagnosis not present

## 2020-08-13 DIAGNOSIS — Z0289 Encounter for other administrative examinations: Secondary | ICD-10-CM

## 2020-08-13 DIAGNOSIS — R739 Hyperglycemia, unspecified: Secondary | ICD-10-CM

## 2020-08-13 DIAGNOSIS — E539 Vitamin B deficiency, unspecified: Secondary | ICD-10-CM

## 2020-08-13 DIAGNOSIS — E669 Obesity, unspecified: Secondary | ICD-10-CM

## 2020-08-13 DIAGNOSIS — E038 Other specified hypothyroidism: Secondary | ICD-10-CM

## 2020-08-14 LAB — HEMOGLOBIN A1C
Est. average glucose Bld gHb Est-mCnc: 120 mg/dL
Hgb A1c MFr Bld: 5.8 % — ABNORMAL HIGH (ref 4.8–5.6)

## 2020-08-14 LAB — LIPID PANEL WITH LDL/HDL RATIO
Cholesterol, Total: 285 mg/dL — ABNORMAL HIGH (ref 100–199)
HDL: 42 mg/dL (ref >39)
LDL Chol Calc (NIH): 216 mg/dL — ABNORMAL HIGH (ref 0–99)
LDL/HDL Ratio: 5.1 ratio — ABNORMAL HIGH (ref 0.0–3.2)
Triglycerides: 143 mg/dL (ref 0–149)
VLDL Cholesterol Cal: 27 mg/dL (ref 5–40)

## 2020-08-14 LAB — COMPREHENSIVE METABOLIC PANEL
ALT: 13 IU/L (ref 0–32)
AST: 13 IU/L (ref 0–40)
Albumin/Globulin Ratio: 1.3 (ref 1.2–2.2)
Albumin: 4.2 g/dL (ref 3.8–4.9)
Alkaline Phosphatase: 74 IU/L (ref 44–121)
BUN/Creatinine Ratio: 18 (ref 12–28)
BUN: 17 mg/dL (ref 8–27)
Bilirubin Total: 0.4 mg/dL (ref 0.0–1.2)
CO2: 20 mmol/L (ref 20–29)
Calcium: 9.5 mg/dL (ref 8.7–10.3)
Chloride: 102 mmol/L (ref 96–106)
Creatinine, Ser: 0.93 mg/dL (ref 0.57–1.00)
Globulin, Total: 3.3 g/dL (ref 1.5–4.5)
Glucose: 77 mg/dL (ref 65–99)
Potassium: 4.6 mmol/L (ref 3.5–5.2)
Sodium: 141 mmol/L (ref 134–144)
Total Protein: 7.5 g/dL (ref 6.0–8.5)
eGFR: 70 mL/min/{1.73_m2} (ref >59)

## 2020-08-14 LAB — CBC WITH DIFFERENTIAL
Basophils Absolute: 0.1 10*3/uL (ref 0.0–0.2)
Basos: 1 %
EOS (ABSOLUTE): 0.3 10*3/uL (ref 0.0–0.4)
Eos: 3 %
Hematocrit: 45 % (ref 34.0–46.6)
Hemoglobin: 14.7 g/dL (ref 11.1–15.9)
Immature Grans (Abs): 0.1 10*3/uL (ref 0.0–0.1)
Immature Granulocytes: 1 %
Lymphocytes Absolute: 3 10*3/uL (ref 0.7–3.1)
Lymphs: 24 %
MCH: 28.5 pg (ref 26.6–33.0)
MCHC: 32.7 g/dL (ref 31.5–35.7)
MCV: 87 fL (ref 79–97)
Monocytes Absolute: 0.7 10*3/uL (ref 0.1–0.9)
Monocytes: 6 %
Neutrophils Absolute: 8.2 10*3/uL — ABNORMAL HIGH (ref 1.4–7.0)
Neutrophils: 65 %
RBC: 5.15 x10E6/uL (ref 3.77–5.28)
RDW: 14 % (ref 11.7–15.4)
WBC: 12.4 10*3/uL — ABNORMAL HIGH (ref 3.4–10.8)

## 2020-08-14 LAB — TSH: TSH: 2.64 u[IU]/mL (ref 0.450–4.500)

## 2020-08-14 LAB — T4: T4, Total: 9.9 ug/dL (ref 4.5–12.0)

## 2020-08-14 LAB — VITAMIN B12: Vitamin B-12: 1722 pg/mL — ABNORMAL HIGH (ref 232–1245)

## 2020-08-14 LAB — T3: T3, Total: 86 ng/dL (ref 71–180)

## 2020-08-14 LAB — VITAMIN D 25 HYDROXY (VIT D DEFICIENCY, FRACTURES): Vit D, 25-Hydroxy: 22 ng/mL — ABNORMAL LOW (ref 30.0–100.0)

## 2020-08-14 LAB — INSULIN, RANDOM: INSULIN: 18 u[IU]/mL (ref 2.6–24.9)

## 2020-08-14 LAB — FOLATE: Folate: 10.6 ng/mL (ref >3.0)

## 2020-08-17 NOTE — Progress Notes (Signed)
Chief Complaint:   OBESITY Vanessa [name] (MR# [id number]) is a 60 y.o. female who presents for evaluation and treatment of obesity and related comorbidities. Current BMI is Body mass index is 32.56 kg/m. Vanessa [name] has been struggling with her weight for many years and has been unsuccessful in either losing weight, maintaining weight loss, or reaching her healthy weight goal.  Vanessa [name] is currently in the action stage of change and ready to dedicate time achieving and maintaining a healthier weight. Vanessa [name] is interested in becoming our patient and working on intensive lifestyle modifications including (but not limited to) diet and exercise for weight loss.   Vanessa [name]'s habits were reviewed today and are as follows: Her family eats meals together, she thinks her family will eat healthier with her, her desired weight loss is 28-38 lbs, she started gaining weight after 40, her heaviest weight ever was 185 pounds, she has significant food cravings issues, she snacks frequently in the evenings, she is frequently drinking liquids with calories, she frequently makes poor food choices, she frequently eats larger portions than normal and she struggles with emotional eating.  Depression Screen Vanessa [name]'s Food and Mood (modified PHQ-9) score was 14.  Depression screen [facility] 2/9 08/13/2020  Decreased Interest 0  Down, Depressed, Hopeless 1  PHQ - 2 Score 1  Altered sleeping 0  Tired, decreased energy 1  Change in appetite 3  Feeling bad or failure about yourself  0  Trouble concentrating 0  Moving slowly or fidgety/restless 0  Suicidal thoughts 0  PHQ-9 Score 5  Difficult doing work/chores Not difficult at all  Some recent data might be hidden   Subjective:   1. Other fatigue Vanessa [name] admits to daytime somnolence and admits to waking up still tired. Patent has a history of symptoms of daytime fatigue. Vanessa [name] generally gets 9 hours of sleep per night, and states that she has generally restful  sleep. Snoring is not present. Apneic episodes are not present. Epworth Sleepiness Score is 5.  2. SOB (shortness of breath) on exertion Vanessa [name] notes increasing shortness of breath with exercising and seems to be worsening over time with weight gain. She notes getting out of breath sooner with activity than she used to. This has not gotten worse recently. Vanessa [name] denies shortness of breath at rest or orthopnea.  3. Mixed hyperlipidemia Vanessa [name] has a history of elevated cholesterol and intolerance to statins. She is working on diet, exercise, and weight loss to help improve her levels.  4. Vitamin B deficiency Vanessa [name] has a history of Vit B12 deficiency. She has been on B12 injections in the past.  5. Vitamin D deficiency Vanessa [name] has a history of Vit D deficiency, and she notes fatigue.  6. Hyperglycemia Vanessa [name] has a history of some elevated glucose levels in the past. She has no history of diabetes mellitus.  7. Other specified hypothyroidism Vanessa [name] is on Synthroid, and she still notes some fatigue and unintentional weight gain.  8. At risk for heart disease Vanessa [name] is at a higher than average risk for cardiovascular disease due to obesity.   Assessment/Plan:   1. Other fatigue Vanessa [name] does feel that her weight is causing her energy to be lower than it should be. Fatigue may be related to obesity, depression or many other causes. Labs will be ordered, and in the meanwhile, Vanessa [name] will focus on self care including making healthy food choices, increasing physical activity and focusing on stress reduction.  - Folate - CBC With Differential - EKG  12-Lead  2. SOB (shortness of breath) on exertion Vanessa [name] does feel that she gets out of breath more easily that she used to when she exercises. Vanessa [name]'s shortness of breath appears to be obesity related and exercise induced. She has agreed to work on weight loss and gradually increase exercise to treat her exercise induced shortness of  breath. Will continue to monitor closely.  3. Mixed hyperlipidemia Cardiovascular risk and specific lipid/LDL goals reviewed. We discussed several lifestyle modifications today. We will check labs today. Vanessa [name] will start her Category 2 plan, and will continue to work on diet, exercise and weight loss efforts. Orders and follow up as documented in patient record.   - Lipid Panel With LDL/HDL Ratio  4. Vitamin B deficiency The diagnosis was reviewed with the patient. We will check labs today. Vanessa [name] will continue to follow up as directed. Orders and follow up as documented in patient record.  - Vitamin B12  5. Vitamin D deficiency Low Vitamin D level contributes to fatigue and are associated with obesity, breast, and colon cancer. We will check labs today. Vanessa [name] will follow-up for routine testing of Vitamin D, at least 2-3 times per year to avoid over-replacement.  - VITAMIN D 25 Hydroxy (Vit-D Deficiency, Fractures)  6. Hyperglycemia We will check labs today, and results with be discussed with Vanessa [name] in 2 weeks at her follow up visit. In the meanwhile Vanessa [name] will start on her Category 2 plan and will work on weight loss efforts.  - Insulin, random - Comprehensive metabolic panel - Hemoglobin A1c  7. Other specified hypothyroidism We will check labs today. Vanessa [name] will continue Synthroid, and will follow up as directed. Orders and follow up as documented in patient record.  - TSH - T3 - T4  8. Screening for depression Vanessa [name] had a positive depression screening. Depression is commonly associated with obesity and often results in emotional eating behaviors. We will monitor this closely and work on CBT to help improve the non-hunger eating patterns. Referral to Psychology may be required if no improvement is seen as she continues in our clinic.  9. At risk for heart disease Vanessa [name] was given approximately 30 minutes of coronary artery disease prevention counseling today. She is  60 y.o. female and has risk factors for heart disease including obesity. We discussed intensive lifestyle modifications today with an emphasis on specific weight loss instructions and strategies.   Repetitive spaced learning was employed today to elicit superior memory formation and behavioral change.  10. Obesity with cureent BMI 32.6 Vanessa [name] is currently in the action stage of change and her goal is to continue with weight loss efforts. I recommend Vanessa [name] begin the structured treatment plan as follows:  She has agreed to the Category 2 Plan.  Exercise goals: No exercise has been prescribed at this time.   Behavioral modification strategies: increasing lean protein intake and increasing vegetables.  She was informed of the importance of frequent follow-up visits to maximize her success with intensive lifestyle modifications for her multiple health conditions. She was informed we would discuss her lab results at her next visit unless there is a critical issue that needs to be addressed sooner. Vanessa [name] agreed to keep her next visit at the agreed upon time to discuss these results.  Objective:   Blood pressure (!) 144/84, pulse 73, temperature 97.7 F (36.5 C), height 5\' 2"  (1.575 m), weight 178 lb (80.7 kg), last menstrual period 03/08/2006, SpO2 97 %. Body mass index is 32.56 kg/m.  EKG: Normal  sinus rhythm, rate 76 BPM.  Indirect Calorimeter completed today shows a VO2 of 214 and a REE of 1486.  Her calculated basal metabolic rate is 0865 thus her basal metabolic rate is better than expected.  General: Cooperative, alert, well developed, in no acute distress. HEENT: Conjunctivae and lids unremarkable. Cardiovascular: Regular rhythm.  Lungs: Normal work of breathing. Neurologic: No focal deficits.   Lab Results  Component Value Date   CREATININE 0.93 08/13/2020   BUN 17 08/13/2020   NA 141 08/13/2020   K 4.6 08/13/2020   CL 102 08/13/2020   CO2 20 08/13/2020   Lab Results   Component Value Date   ALT 13 08/13/2020   AST 13 08/13/2020   ALKPHOS 74 08/13/2020   BILITOT 0.4 08/13/2020   Lab Results  Component Value Date   HGBA1C 5.8 (H) 08/13/2020   HGBA1C 5.7 07/21/2015   HGBA1C 5.7 01/22/2013   Lab Results  Component Value Date   INSULIN 18.0 08/13/2020   Lab Results  Component Value Date   TSH 2.640 08/13/2020   Lab Results  Component Value Date   CHOL 285 (H) 08/13/2020   HDL 42 08/13/2020   LDLCALC 216 (H) 08/13/2020   LDLDIRECT 199.0 10/15/2018   TRIG 143 08/13/2020   CHOLHDL 7.7 (H) 10/21/2019   Lab Results  Component Value Date   WBC 12.4 (H) 08/13/2020   HGB 14.7 08/13/2020   HCT 45.0 08/13/2020   MCV 87 08/13/2020   PLT 343.0 03/18/2020   No results found for: IRON, TIBC, FERRITIN  Attestation Statements:   Reviewed by clinician on day of visit: allergies, medications, problem list, medical history, surgical history, family history, social history, and previous encounter notes.   I, Newman Pies, am acting as Location manager for Dennard Nip, MD.  I have reviewed the above documentation for accuracy and completeness, and I agree with the above. - Dennard Nip, MD

## 2020-08-20 ENCOUNTER — Telehealth: Payer: Self-pay | Admitting: *Deleted

## 2020-08-20 NOTE — Telephone Encounter (Signed)
Former Dr.Kendally patient)   Patient called c/o vaginal irritation and burning only, no urinary symptoms, no white discharge. She asked if Rx could be sent in for yeast? Please advise

## 2020-08-21 MED ORDER — FLUCONAZOLE 150 MG PO TABS: 150.0000 mg | ORAL_TABLET | Freq: Every day | ORAL | 0 refills | Status: DC

## 2020-08-21 NOTE — Telephone Encounter (Signed)
Rx sent, patient aware.

## 2020-08-25 ENCOUNTER — Other Ambulatory Visit: Payer: Self-pay | Admitting: Internal Medicine

## 2020-08-25 ENCOUNTER — Ambulatory Visit: Payer: BC Managed Care – PPO | Admitting: Internal Medicine

## 2020-08-25 NOTE — Telephone Encounter (Signed)
Check [place] registry last filled 07/07/2020../lmb  

## 2020-08-26 ENCOUNTER — Ambulatory Visit (INDEPENDENT_AMBULATORY_CARE_PROVIDER_SITE_OTHER): Payer: BC Managed Care – PPO

## 2020-08-26 ENCOUNTER — Telehealth: Payer: Self-pay

## 2020-08-26 ENCOUNTER — Ambulatory Visit: Payer: BC Managed Care – PPO

## 2020-08-26 ENCOUNTER — Other Ambulatory Visit: Payer: Self-pay

## 2020-08-26 DIAGNOSIS — E538 Deficiency of other specified B group vitamins: Secondary | ICD-10-CM | POA: Diagnosis not present

## 2020-08-26 NOTE — Progress Notes (Signed)
B12 given via IM Right Deltoid  Tolerated well.

## 2020-08-26 NOTE — Telephone Encounter (Signed)
It was done.  Thanks °

## 2020-08-27 ENCOUNTER — Other Ambulatory Visit: Payer: Self-pay

## 2020-08-27 ENCOUNTER — Ambulatory Visit (INDEPENDENT_AMBULATORY_CARE_PROVIDER_SITE_OTHER): Payer: BC Managed Care – PPO | Admitting: Family Medicine

## 2020-08-27 ENCOUNTER — Encounter (INDEPENDENT_AMBULATORY_CARE_PROVIDER_SITE_OTHER): Payer: Self-pay | Admitting: Family Medicine

## 2020-08-27 ENCOUNTER — Telehealth (INDEPENDENT_AMBULATORY_CARE_PROVIDER_SITE_OTHER): Payer: Self-pay

## 2020-08-27 VITALS — BP 128/84 | HR 79 | Temp 98.2°F | Ht 62.0 in | Wt 176.0 lb

## 2020-08-27 DIAGNOSIS — E782 Mixed hyperlipidemia: Secondary | ICD-10-CM

## 2020-08-27 DIAGNOSIS — R7303 Prediabetes: Secondary | ICD-10-CM | POA: Diagnosis not present

## 2020-08-27 DIAGNOSIS — Z9189 Other specified personal risk factors, not elsewhere classified: Secondary | ICD-10-CM | POA: Diagnosis not present

## 2020-08-27 DIAGNOSIS — E669 Obesity, unspecified: Secondary | ICD-10-CM | POA: Diagnosis not present

## 2020-08-27 DIAGNOSIS — E559 Vitamin D deficiency, unspecified: Secondary | ICD-10-CM

## 2020-08-27 DIAGNOSIS — Z6832 Body mass index (BMI) 32.0-32.9, adult: Secondary | ICD-10-CM

## 2020-08-27 MED ORDER — VITAMIN D (ERGOCALCIFEROL) 1.25 MG (50000 UNIT) PO CAPS: 50000.0000 [IU] | ORAL_CAPSULE | ORAL | 0 refills | Status: DC

## 2020-08-27 NOTE — Telephone Encounter (Signed)
Pt called in and stated that she just left from her appt. The pt stated that her and Dr. Leafy Ro talked about a Vitium  D medication. I told her that I can send a message to the nurse and that someone will be in touch with her. Please advise

## 2020-08-31 ENCOUNTER — Encounter (INDEPENDENT_AMBULATORY_CARE_PROVIDER_SITE_OTHER): Payer: Self-pay | Admitting: Emergency Medicine

## 2020-08-31 NOTE — Telephone Encounter (Signed)
Vit D medication was sent into the pharmacy on 08/27/20 I sent patient a msg and stated if she is still having issues picking up her med to send me a msg and I will call the pharmacy to see what is going on with her medication

## 2020-09-01 ENCOUNTER — Telehealth (INDEPENDENT_AMBULATORY_CARE_PROVIDER_SITE_OTHER): Payer: Self-pay

## 2020-09-01 ENCOUNTER — Ambulatory Visit (INDEPENDENT_AMBULATORY_CARE_PROVIDER_SITE_OTHER): Payer: BC Managed Care – PPO

## 2020-09-01 ENCOUNTER — Other Ambulatory Visit: Payer: Self-pay

## 2020-09-01 ENCOUNTER — Other Ambulatory Visit: Payer: Self-pay | Admitting: Obstetrics and Gynecology

## 2020-09-01 DIAGNOSIS — Z01419 Encounter for gynecological examination (general) (routine) without abnormal findings: Secondary | ICD-10-CM

## 2020-09-01 DIAGNOSIS — Z780 Asymptomatic menopausal state: Secondary | ICD-10-CM

## 2020-09-01 DIAGNOSIS — M8588 Other specified disorders of bone density and structure, other site: Secondary | ICD-10-CM | POA: Diagnosis not present

## 2020-09-01 DIAGNOSIS — Z13820 Encounter for screening for osteoporosis: Secondary | ICD-10-CM

## 2020-09-01 NOTE — Telephone Encounter (Signed)
It is not likely to be a food allergy. She should discuss with her PCP or we are happy to refer her to allergy for testing.

## 2020-09-01 NOTE — Telephone Encounter (Signed)
Patient saw Dr. Leafy Ro on 6/2.  She is on the Cat 2 plan.  She has broke out in hives & not feeling well.  Is wondering if she is having an allergic reaction to something on the meal plan.  Requesting to call her office at [phone].  If she is at her desk she can pick-up.  If you call her cell phone before 2:30 leave a msg and she will call back.  She can take calls on her cell after 2:30.    Thank you

## 2020-09-02 ENCOUNTER — Ambulatory Visit: Payer: BC Managed Care – PPO | Admitting: Emergency Medicine

## 2020-09-02 ENCOUNTER — Encounter (INDEPENDENT_AMBULATORY_CARE_PROVIDER_SITE_OTHER): Payer: Self-pay | Admitting: Emergency Medicine

## 2020-09-02 NOTE — Progress Notes (Signed)
Chief Complaint:   OBESITY Vanessa [name] is here to discuss her progress with her obesity treatment plan along with follow-up of her obesity related diagnoses. Vanessa [name] is on the Category 2 Plan and states she is following her eating plan approximately 90% of the time. Vanessa [name] states she is doing 0 minutes 0 times per week.  Today's visit was #: 2 Starting weight: 178 lbs Starting date: 08/13/2020 Today's weight: 176 lbs Today's date: 08/27/2020 Total lbs lost to date: 2 Total lbs lost since last in-office visit: 2  Interim History: Vanessa [name] did well with weight loss on her Category 2 plan. Her hunger was mostly controlled and she did do some celebration eating over [employer]. She would like more fruit options if possible.  Subjective:   1. Mixed hyperlipidemia Vanessa [name]'s LDL is very elevated and HDL is lower than goal. She has tried 3 statins in the past but she has had side effects. She is hoping to improve with diet and weight loss. I discussed labs with the patient today.  2. Vitamin D deficiency Vanessa [name]'s Vit D level is low and she notes fatigue. She is not on Vit D currently. I discussed labs with the patient today.  3. Pre-diabetes Vanessa [name]'s A1c and fasting glucose are elevated, consistent with pre-diabetes. She noted polyphagia had improved with diet, exercise, and weight loss. I discussed labs with the patient today.  4. At risk for diabetes mellitus Vanessa [name] is at higher than average risk for developing diabetes due to obesity.   Assessment/Plan:   1. Mixed hyperlipidemia Cardiovascular risk and specific lipid/LDL goals reviewed. We discussed several lifestyle modifications today. Wyllow will continue to work on diet, exercise and weight loss efforts. We will recheck labs in 3 months. Orders and follow up as documented in patient record.   2. Vitamin D deficiency Low Vitamin D level contributes to fatigue and are associated with obesity, breast, and colon cancer. Vanessa [name]  agreed to start prescription Vitamin D 50,000 IU every week with no refills. She will follow-up for routine testing of Vitamin D, at least 2-3 times per year to avoid over-replacement.  - Vitamin D, Ergocalciferol, (DRISDOL) 1.25 MG (50000 UNIT) CAPS capsule; Take 1 capsule (50,000 Units total) by mouth every 7 (seven) days.  Dispense: 4 capsule; Refill: 0  3. Pre-diabetes Vanessa [name] will continue to work on weight loss, exercise, and decreasing simple carbohydrates to help decrease the risk of diabetes. Pre-diabetes was explained to the patient, and we will recheck labs in 3 months.  4. At risk for diabetes mellitus Vanessa [name] was given approximately 30 minutes of diabetes education and counseling today. We discussed intensive lifestyle modifications today with an emphasis on weight loss as well as increasing exercise and decreasing simple carbohydrates in her diet. We also reviewed medication options with an emphasis on risk versus benefit of those discussed.   Repetitive spaced learning was employed today to elicit superior memory formation and behavioral change.  5. Obesity with current BMI 32.3 Vanessa [name] is currently in the action stage of change. As such, her goal is to continue with weight loss efforts. She has agreed to the Category 2 Plan.   Smart Fruit handout was given today.  Behavioral modification strategies: increasing lean protein intake and meal planning and cooking strategies.  Vanessa [name] has agreed to follow-up with our clinic in 2 weeks. She was informed of the importance of frequent follow-up visits to maximize her success with intensive lifestyle modifications for her multiple health conditions.   Objective:  Blood pressure 128/84, pulse 79, temperature 98.2 F (36.8 C), height 5\' 2"  (1.575 m), weight 176 lb (79.8 kg), last menstrual period 03/08/2006, SpO2 97 %. Body mass index is 32.19 kg/m.  General: Cooperative, alert, well developed, in no acute distress. HEENT:  Conjunctivae and lids unremarkable. Cardiovascular: Regular rhythm.  Lungs: Normal work of breathing. Neurologic: No focal deficits.   Lab Results  Component Value Date   CREATININE 0.93 08/13/2020   BUN 17 08/13/2020   NA 141 08/13/2020   K 4.6 08/13/2020   CL 102 08/13/2020   CO2 20 08/13/2020   Lab Results  Component Value Date   ALT 13 08/13/2020   AST 13 08/13/2020   ALKPHOS 74 08/13/2020   BILITOT 0.4 08/13/2020   Lab Results  Component Value Date   HGBA1C 5.8 (H) 08/13/2020   HGBA1C 5.7 07/21/2015   HGBA1C 5.7 01/22/2013   Lab Results  Component Value Date   INSULIN 18.0 08/13/2020   Lab Results  Component Value Date   TSH 2.640 08/13/2020   Lab Results  Component Value Date   CHOL 285 (H) 08/13/2020   HDL 42 08/13/2020   LDLCALC 216 (H) 08/13/2020   LDLDIRECT 199.0 10/15/2018   TRIG 143 08/13/2020   CHOLHDL 7.7 (H) 10/21/2019   Lab Results  Component Value Date   WBC 12.4 (H) 08/13/2020   HGB 14.7 08/13/2020   HCT 45.0 08/13/2020   MCV 87 08/13/2020   PLT 343.0 03/18/2020   No results found for: IRON, TIBC, FERRITIN  Attestation Statements:   Reviewed by clinician on day of visit: allergies, medications, problem list, medical history, surgical history, family history, social history, and previous encounter notes.   I, Trixie Dredge, am acting as transcriptionist for Dennard Nip, MD.  I have reviewed the above documentation for accuracy and completeness, and I agree with the above. -  Dennard Nip, MD

## 2020-09-02 NOTE — Telephone Encounter (Signed)
I sent patient a mychart msg and I also called her and left below msg from Dr Leafy Ro on patient answer machine.

## 2020-09-04 ENCOUNTER — Ambulatory Visit: Payer: BC Managed Care – PPO | Admitting: Internal Medicine

## 2020-09-16 ENCOUNTER — Other Ambulatory Visit: Payer: Self-pay

## 2020-09-16 ENCOUNTER — Ambulatory Visit (INDEPENDENT_AMBULATORY_CARE_PROVIDER_SITE_OTHER): Payer: BC Managed Care – PPO | Admitting: Family Medicine

## 2020-09-16 ENCOUNTER — Encounter (INDEPENDENT_AMBULATORY_CARE_PROVIDER_SITE_OTHER): Payer: Self-pay | Admitting: Family Medicine

## 2020-09-16 VITALS — BP 150/80 | HR 78 | Temp 98.3°F | Ht 62.0 in | Wt 174.0 lb

## 2020-09-16 DIAGNOSIS — Z6832 Body mass index (BMI) 32.0-32.9, adult: Secondary | ICD-10-CM | POA: Diagnosis not present

## 2020-09-16 DIAGNOSIS — I10 Essential (primary) hypertension: Secondary | ICD-10-CM | POA: Diagnosis not present

## 2020-09-16 DIAGNOSIS — E669 Obesity, unspecified: Secondary | ICD-10-CM | POA: Diagnosis not present

## 2020-09-16 DIAGNOSIS — E559 Vitamin D deficiency, unspecified: Secondary | ICD-10-CM

## 2020-09-16 DIAGNOSIS — E66811 Obesity, class 1: Secondary | ICD-10-CM

## 2020-09-16 DIAGNOSIS — Z9189 Other specified personal risk factors, not elsewhere classified: Secondary | ICD-10-CM | POA: Diagnosis not present

## 2020-09-16 MED ORDER — HYDROCHLOROTHIAZIDE 12.5 MG PO CAPS: 12.5000 mg | ORAL_CAPSULE | Freq: Every morning | ORAL | 0 refills | Status: DC

## 2020-09-29 ENCOUNTER — Ambulatory Visit (INDEPENDENT_AMBULATORY_CARE_PROVIDER_SITE_OTHER): Payer: BC Managed Care – PPO | Admitting: Family Medicine

## 2020-09-30 ENCOUNTER — Ambulatory Visit: Payer: BC Managed Care – PPO

## 2020-10-05 NOTE — Progress Notes (Signed)
Chief Complaint:   OBESITY Vanessa [name] is here to discuss her progress with her obesity treatment plan along with follow-up of her obesity related diagnoses. Vanessa [name] is on the Category 2 Plan and states she is following her eating plan approximately 85% of the time. Vanessa [name] states she is doing 0 minutes 0 times per week.  Today's visit was #: 3 Starting weight: 178 lbs Starting date: 08/13/2020 Today's weight: 174 lbs Today's date: 09/16/2020 Total lbs lost to date: 4 Total lbs lost since last in-office visit: 2  Interim History: Vanessa [name] continues to do well with weight loss despite extra temptations at work. She wants to have more options for dinner, especially to eat out some.  Subjective:   1. Essential hypertension Vanessa [name]'s blood pressure is elevated again, despite her weight loss.  2. Vitamin D deficiency Vanessa [name] is stable on Vit D, and she denies nausea, vomiting, or muscle weakness.  3. At risk for impaired metabolic function Vanessa [name] is at increased risk for impaired metabolic function due to current nutrition and muscle mass.  Assessment/Plan:   1. Essential hypertension Vanessa [name] agreed to start hydrochlorothiazide 12.5 mg q AM with no refills. She will continue working on healthy weight loss and exercise to improve blood pressure control. We will watch for signs of hypotension as she continues her lifestyle modifications.  - hydrochlorothiazide (MICROZIDE) 12.5 MG capsule; Take 1 capsule (12.5 mg total) by mouth in the morning.  Dispense: 30 capsule; Refill: 0  2. Vitamin D deficiency Low Vitamin D level contributes to fatigue and are associated with obesity, breast, and colon cancer. Vanessa [name] agreed to continue taking prescription Vitamin D 50,000 IU every week, and we will recheck labs in 1-2 months. She will follow-up for routine testing of Vitamin D, at least 2-3 times per year to avoid over-replacement.  3. At risk for impaired metabolic function Vanessa [name] was given  approximately 15 minutes of impaired  metabolic function prevention counseling today. We discussed intensive lifestyle modifications today with an emphasis on specific nutrition and exercise instructions and strategies.   Repetitive spaced learning was employed today to elicit superior memory formation and behavioral change.  4. Obesity with current BMI 31.96 Vanessa [name] is currently in the action stage of change. As such, her goal is to continue with weight loss efforts. She has agreed to the Category 2 Plan and keeping a food journal and adhering to recommended goals of 350-550 calories and 40 grams of protein at supper daily.   Behavioral modification strategies: increasing lean protein intake, travel eating strategies, and holiday eating strategies .  Vanessa [name] has agreed to follow-up with our clinic in 4 weeks. She was informed of the importance of frequent follow-up visits to maximize her success with intensive lifestyle modifications for her multiple health conditions.   Objective:   Blood pressure (!) 150/80, pulse 78, temperature 98.3 F (36.8 C), height 5\' 2"  (1.575 m), weight 174 lb (78.9 kg), last menstrual period 03/08/2006, SpO2 96 %. Body mass index is 31.83 kg/m.  General: Cooperative, alert, well developed, in no acute distress. HEENT: Conjunctivae and lids unremarkable. Cardiovascular: Regular rhythm.  Lungs: Normal work of breathing. Neurologic: No focal deficits.   Lab Results  Component Value Date   CREATININE 0.93 08/13/2020   BUN 17 08/13/2020   NA 141 08/13/2020   K 4.6 08/13/2020   CL 102 08/13/2020   CO2 20 08/13/2020   Lab Results  Component Value Date   ALT 13 08/13/2020   AST 13 08/13/2020  ALKPHOS 74 08/13/2020   BILITOT 0.4 08/13/2020   Lab Results  Component Value Date   HGBA1C 5.8 (H) 08/13/2020   HGBA1C 5.7 07/21/2015   HGBA1C 5.7 01/22/2013   Lab Results  Component Value Date   INSULIN 18.0 08/13/2020   Lab Results  Component Value Date    TSH 2.640 08/13/2020   Lab Results  Component Value Date   CHOL 285 (H) 08/13/2020   HDL 42 08/13/2020   LDLCALC 216 (H) 08/13/2020   LDLDIRECT 199.0 10/15/2018   TRIG 143 08/13/2020   CHOLHDL 7.7 (H) 10/21/2019   Lab Results  Component Value Date   VD25OH 22.0 (L) 08/13/2020   VD25OH 35 04/10/2020   VD25OH 46 10/21/2019   Lab Results  Component Value Date   WBC 12.4 (H) 08/13/2020   HGB 14.7 08/13/2020   HCT 45.0 08/13/2020   MCV 87 08/13/2020   PLT 343.0 03/18/2020   No results found for: IRON, TIBC, FERRITIN  Attestation Statements:   Reviewed by clinician on day of visit: allergies, medications, problem list, medical history, surgical history, family history, social history, and previous encounter notes.   I, Trixie Dredge, am acting as transcriptionist for Dennard Nip, MD.  I have reviewed the above documentation for accuracy and completeness, and I agree with the above. -  Dennard Nip, MD

## 2020-10-08 ENCOUNTER — Ambulatory Visit (INDEPENDENT_AMBULATORY_CARE_PROVIDER_SITE_OTHER): Payer: BC Managed Care – PPO | Admitting: Family Medicine

## 2020-10-08 ENCOUNTER — Encounter (INDEPENDENT_AMBULATORY_CARE_PROVIDER_SITE_OTHER): Payer: Self-pay | Admitting: Family Medicine

## 2020-10-08 ENCOUNTER — Other Ambulatory Visit: Payer: Self-pay

## 2020-10-08 VITALS — BP 137/90 | HR 84 | Temp 98.2°F | Ht 62.0 in | Wt 172.0 lb

## 2020-10-08 DIAGNOSIS — E669 Obesity, unspecified: Secondary | ICD-10-CM | POA: Diagnosis not present

## 2020-10-08 DIAGNOSIS — I10 Essential (primary) hypertension: Secondary | ICD-10-CM

## 2020-10-08 DIAGNOSIS — Z6832 Body mass index (BMI) 32.0-32.9, adult: Secondary | ICD-10-CM

## 2020-10-08 DIAGNOSIS — Z9189 Other specified personal risk factors, not elsewhere classified: Secondary | ICD-10-CM

## 2020-10-08 DIAGNOSIS — K5900 Constipation, unspecified: Secondary | ICD-10-CM

## 2020-10-08 NOTE — Telephone Encounter (Signed)
Dr.Beasley 

## 2020-10-12 ENCOUNTER — Ambulatory Visit: Payer: BC Managed Care – PPO | Admitting: Internal Medicine

## 2020-10-12 ENCOUNTER — Telehealth (INDEPENDENT_AMBULATORY_CARE_PROVIDER_SITE_OTHER): Payer: Self-pay

## 2020-10-12 NOTE — Telephone Encounter (Signed)
Dr.Beasley 

## 2020-10-12 NOTE — Telephone Encounter (Signed)
Called patient was unable to reach patient by phone to see what questions she had about Vit D. I will also still forward the message to Dr Leafy Ro an refilling alprazolam

## 2020-10-12 NOTE — Telephone Encounter (Signed)
Pt called in and stated that she would like a call back, the pts said she has some concerns.  Pt has questions about why he Vitamin d refill. The pt does need a refill on the alprazolam, please send to [facility] on [address]. The pt also wants to know does she need to stop taking her b-12 as well. I told her I will send a message and the nurse will be in touch. Please advise

## 2020-10-13 ENCOUNTER — Ambulatory Visit (INDEPENDENT_AMBULATORY_CARE_PROVIDER_SITE_OTHER): Payer: BC Managed Care – PPO | Admitting: Family Medicine

## 2020-10-14 ENCOUNTER — Encounter (INDEPENDENT_AMBULATORY_CARE_PROVIDER_SITE_OTHER): Payer: Self-pay | Admitting: Emergency Medicine

## 2020-10-14 NOTE — Telephone Encounter (Signed)
I do not prescribe alprazolam. Will address the other questions at her follow up visit

## 2020-10-14 NOTE — Telephone Encounter (Signed)
Mychart msg has been sent to patient

## 2020-10-14 NOTE — Telephone Encounter (Signed)
Duplicate msg. Patient already informed

## 2020-10-19 NOTE — Progress Notes (Signed)
Chief Complaint:   OBESITY Vanessa [name] is here to discuss her progress with her obesity treatment plan along with follow-up of her obesity related diagnoses. Vanessa [name] is on the Category 2 Plan and keeping a food journal and adhering to recommended goals of 350-550 calories and 40+ grams of protein at supper daily and states she is following her eating plan approximately 80% of the time. Vanessa [name] states she is doing 0 minutes 0 times per week.  Today's visit was #: 4 Starting weight: 178 lbs Starting date: 08/13/2020 Today's weight: 172 lbs Today's date: 10/08/2020 Total lbs lost to date: 6 Total lbs lost since last in-office visit: 2  Interim History: Vanessa [name] did some celebration eating on vacation but was mindful and is working on getting back on track. She has questions about how to do her dinner journal.  Subjective:   1. Essential hypertension Vanessa [name] started hydrochlorothiazide but she felt it caused muscle cramping and such chest tightness (relieved by antacids).  2. Constipation, unspecified constipation type Vanessa [name] notes feeling more bloated with less frequency.  3. At risk for heart disease Vanessa [name] is at a higher than average risk for cardiovascular disease due to obesity.   Assessment/Plan:   1. Essential hypertension Abrar will continue working on healthy weight loss and exercise to improve blood pressure control. She agreed to discontinue hydrochlorothiazide and she is to check her blood pressure at home, and will watch for signs of hypotension as she continues her lifestyle modifications.  2. Constipation, unspecified constipation type Vanessa [name] agreed to start miralax 17 grams q daily as needed, and increase her water and fiber intake. She was informed that a decrease in bowel movement frequency is normal while losing weight, but stools should not be hard or painful. Orders and follow up as documented in patient record.   3. At risk for heart disease Vanessa [name] was  given approximately 15 minutes of coronary artery disease prevention counseling today. She is 60 y.o. female and has risk factors for heart disease including obesity. We discussed intensive lifestyle modifications today with an emphasis on specific weight loss instructions and strategies.   Repetitive spaced learning was employed today to elicit superior memory formation and behavioral change.  4. Obesity with current BMI 31.6 Vanessa [name] is currently in the action stage of change. As such, her goal is to continue with weight loss efforts. She has agreed to the Category 2 Plan and keeping a food journal and adhering to recommended goals of 350-550 calories and 40+ grams of protein at supper daily.   Vanessa [name] was showed how to journal on [employer].  Behavioral modification strategies: increasing lean protein intake and keeping a strict food journal.  Vanessa [name] has agreed to follow-up with our clinic in 2 to 3 weeks. She was informed of the importance of frequent follow-up visits to maximize her success with intensive lifestyle modifications for her multiple health conditions.   Objective:   Blood pressure 137/90, pulse 84, temperature 98.2 F (36.8 C), height '5\' 2"'$  (1.575 m), weight 172 lb (78 kg), last menstrual period 03/08/2006, SpO2 97 %. Body mass index is 31.46 kg/m.  General: Cooperative, alert, well developed, in no acute distress. HEENT: Conjunctivae and lids unremarkable. Cardiovascular: Regular rhythm.  Lungs: Normal work of breathing. Neurologic: No focal deficits.   Lab Results  Component Value Date   CREATININE 0.93 08/13/2020   BUN 17 08/13/2020   NA 141 08/13/2020   K 4.6 08/13/2020   CL 102 08/13/2020   CO2 20  08/13/2020   Lab Results  Component Value Date   ALT 13 08/13/2020   AST 13 08/13/2020   ALKPHOS 74 08/13/2020   BILITOT 0.4 08/13/2020   Lab Results  Component Value Date   HGBA1C 5.8 (H) 08/13/2020   HGBA1C 5.7 07/21/2015   HGBA1C 5.7 01/22/2013    Lab Results  Component Value Date   INSULIN 18.0 08/13/2020   Lab Results  Component Value Date   TSH 2.640 08/13/2020   Lab Results  Component Value Date   CHOL 285 (H) 08/13/2020   HDL 42 08/13/2020   LDLCALC 216 (H) 08/13/2020   LDLDIRECT 199.0 10/15/2018   TRIG 143 08/13/2020   CHOLHDL 7.7 (H) 10/21/2019   Lab Results  Component Value Date   VD25OH 22.0 (L) 08/13/2020   VD25OH 35 04/10/2020   VD25OH 46 10/21/2019   Lab Results  Component Value Date   WBC 12.4 (H) 08/13/2020   HGB 14.7 08/13/2020   HCT 45.0 08/13/2020   MCV 87 08/13/2020   PLT 343.0 03/18/2020   No results found for: IRON, TIBC, FERRITIN  Attestation Statements:   Reviewed by clinician on day of visit: allergies, medications, problem list, medical history, surgical history, family history, social history, and previous encounter notes.   I, Trixie Dredge, am acting as transcriptionist for Dennard Nip, MD.  I have reviewed the above documentation for accuracy and completeness, and I agree with the above. -  Dennard Nip, MD

## 2020-10-21 ENCOUNTER — Encounter: Payer: Self-pay | Admitting: Internal Medicine

## 2020-10-21 ENCOUNTER — Other Ambulatory Visit: Payer: Self-pay

## 2020-10-21 ENCOUNTER — Ambulatory Visit: Payer: BC Managed Care – PPO | Admitting: Internal Medicine

## 2020-10-21 VITALS — BP 128/90 | HR 76 | Temp 98.3°F | Ht 62.0 in | Wt 177.2 lb

## 2020-10-21 DIAGNOSIS — K219 Gastro-esophageal reflux disease without esophagitis: Secondary | ICD-10-CM

## 2020-10-21 DIAGNOSIS — E538 Deficiency of other specified B group vitamins: Secondary | ICD-10-CM | POA: Diagnosis not present

## 2020-10-21 DIAGNOSIS — N289 Disorder of kidney and ureter, unspecified: Secondary | ICD-10-CM

## 2020-10-21 DIAGNOSIS — I129 Hypertensive chronic kidney disease with stage 1 through stage 4 chronic kidney disease, or unspecified chronic kidney disease: Secondary | ICD-10-CM

## 2020-10-21 DIAGNOSIS — E559 Vitamin D deficiency, unspecified: Secondary | ICD-10-CM

## 2020-10-21 DIAGNOSIS — R21 Rash and other nonspecific skin eruption: Secondary | ICD-10-CM

## 2020-10-21 DIAGNOSIS — F419 Anxiety disorder, unspecified: Secondary | ICD-10-CM | POA: Diagnosis not present

## 2020-10-21 MED ORDER — PANTOPRAZOLE SODIUM 40 MG PO TBEC: 40.0000 mg | DELAYED_RELEASE_TABLET | Freq: Every day | ORAL | 3 refills | Status: DC

## 2020-10-21 MED ORDER — CYANOCOBALAMIN 1000 MCG/ML IJ SOLN
1000.0000 ug | Freq: Once | INTRAMUSCULAR | Status: AC
  Administered 2020-10-21: 1000 ug via INTRAMUSCULAR

## 2020-10-21 MED ORDER — DOXYCYCLINE HYCLATE 100 MG PO TABS: 100.0000 mg | ORAL_TABLET | Freq: Two times a day (BID) | ORAL | 0 refills | Status: DC

## 2020-10-21 MED ORDER — OLMESARTAN MEDOXOMIL 20 MG PO TABS: 20.0000 mg | ORAL_TABLET | Freq: Every day | ORAL | 3 refills | Status: DC

## 2020-10-21 MED ORDER — ALPRAZOLAM 0.25 MG PO TABS: 0.2500 mg | ORAL_TABLET | Freq: Two times a day (BID) | ORAL | 2 refills | Status: DC | PRN

## 2020-10-21 NOTE — Patient Instructions (Signed)
  Gluten free trial for 4-6 weeks. OK to use gluten-free bread and gluten-free pasta.    Gluten-Free Diet for Celiac Disease, Adult The gluten-free diet includes all foods that do not contain gluten. Gluten is a protein that is found in wheat, rye, barley, and some other grains. Following the gluten-free diet is the only treatment for people with celiac disease. It helps to prevent damage to the intestines and improves or eliminates the symptoms of celiac disease. Following the gluten-free diet requires some planning. It can be challenging at first, but it gets easier with time and practice. There are more gluten-free options available today than ever before. If you need help finding gluten-free foods or if you have questions, talk with your diet and nutrition specialist (registered dietitian) or your health care provider. What do I need to know about a gluten-free diet?  All fruits, vegetables, and meats are safe to eat and do not contain gluten.  When grocery shopping, start by shopping in the produce, meat, and dairy sections. These sections are more likely to contain gluten-free foods. Then move to the aisles that contain packaged foods if you need to.  Read all food labels. Gluten is often added to foods. Always check the ingredient list and look for warnings, such as "may contain gluten."  Talk with your dietitian or health care provider before taking a gluten-free multivitamin or mineral supplement.  Be aware of gluten-free foods having contact with foods that contain gluten (cross-contamination). This can happen at home and with any processed foods. ? Talk with your health care provider or dietitian about how to reduce the risk of cross-contamination in your home. ? If you have questions about how a food is processed, ask the manufacturer. What key words help to identify gluten? Foods that list any of these key words on the label usually contain gluten:  Wheat, flour, enriched  flour, bromated flour, white flour, durum flour, graham flour, phosphated flour, self-rising flour, semolina, farina, barley (malt), rye, and oats.  Starch, dextrin, modified food starch, or cereal.  Thickening, fillers, or emulsifiers.  Malt flavoring, malt extract, or malt syrup.  Hydrolyzed vegetable protein.  In the [place] packaged foods that are gluten-free are required to be labeled "GF." These foods should be easy to identify and are safe to eat. In the [place] food companies are also required to list common food allergens, including wheat, on their labels. Recommended foods Grains  Amaranth, bean flours, 100% buckwheat flour, corn, millet, nut flours or nut meals, GF oats, quinoa, rice, sorghum, teff, rice wafers, pure cornmeal tortillas, popcorn, and hot cereals made from cornmeal. Hominy, rice, wild rice. Some Asian rice noodles or bean noodles. Arrowroot starch, corn bran, corn flour, corn germ, cornmeal, corn starch, potato flour, potato starch flour, and rice bran. Plain, brown, and sweet rice flours. Rice polish, soy flour, and tapioca starch. Vegetables  All plain fresh, frozen, and canned vegetables. Fruits  All plain fresh, frozen, canned, and dried fruits, and 100% fruit juices. Meats and other protein foods  All fresh beef, pork, poultry, fish, seafood, and eggs. Fish canned in water, oil, brine, or vegetable broth. Plain nuts and seeds, peanut butter. Some lunch meat and some frankfurters. Dried beans, dried peas, and lentils. Dairy  Fresh plain, dry, evaporated, or condensed milk. Cream, butter, sour cream, whipping cream, and most yogurts. Unprocessed cheese, most processed cheeses, some cottage cheese, some cream cheeses. Beverages  Coffee, tea, most herbal teas. Carbonated beverages and some root beers.   Wine, sake, and distilled spirits, such as gin, vodka, and whiskey. Most hard ciders. Fats and oils  Butter, margarine, vegetable oil, hydrogenated butter, olive  oil, shortening, lard, cream, and some mayonnaise. Some commercial salad dressings. Olives. Sweets and desserts  Sugar, honey, some syrups, molasses, jelly, and jam. Plain hard candy, marshmallows, and gumdrops. Pure cocoa powder. Plain chocolate. Custard and some pudding mixes. Gelatin desserts, sorbets, frozen ice pops, and sherbet. Cake, cookies, and other desserts prepared with allowed flours. Some commercial ice creams. Cornstarch, tapioca, and rice puddings. Seasoning and other foods  Some canned or frozen soups. Monosodium glutamate (MSG). Cider, rice, and wine vinegar. Baking soda and baking powder. Cream of tartar. Baking and nutritional yeast. Certain soy sauces made without wheat (ask your dietitian about specific brands that are allowed). Nuts, coconut, and chocolate. Salt, pepper, herbs, spices, flavoring extracts, imitation or artificial flavorings, natural flavorings, and food colorings. Some medicines and supplements. Some lip glosses and other cosmetics. Rice syrups. The items listed may not be a complete list. Talk with your dietitian about what dietary choices are best for you. Foods to avoid Grains  Barley, bran, bulgur, couscous, cracked wheat, [place], farro, graham, malt, matzo, semolina, wheat germ, and all wheat and rye cereals including spelt and kamut. Cereals containing malt as a flavoring, such as rice cereal. Noodles, spaghetti, macaroni, most packaged rice mixes, and all mixes containing wheat, rye, barley, or triticale. Vegetables  Most creamed vegetables and most vegetables canned in sauces. Some commercially prepared vegetables and salads. Fruits  Thickened or prepared fruits and some pie fillings. Some fruit snacks and fruit roll-ups. Meats and other protein foods  Any meat or meat alternative containing wheat, rye, barley, or gluten stabilizers. These are often marinated or packaged meats and lunch meats. Bread-containing products, such as Swiss steak,  croquettes, meatballs, and meatloaf. Most tuna canned in vegetable broth and turkey with hydrolyzed vegetable protein (HVP) injected as part of the basting. Seitan. Imitation fish. Eggs in sauces made from ingredients to avoid. Dairy  Commercial chocolate milk drinks and malted milk. Some non-dairy creamers. Any cheese product containing ingredients to avoid. Beverages  Certain cereal beverages. Beer, ale, malted milk, and some root beers. Some hard ciders. Some instant flavored coffees. Some herbal teas made with barley or with barley malt added. Fats and oils  Some commercial salad dressings. Sour cream containing modified food starch. Sweets and desserts  Some toffees. Chocolate-coated nuts (may be rolled in wheat flour) and some commercial candies and candy bars. Most cakes, cookies, donuts, pastries, and other baked goods. Some commercial ice cream. Ice cream cones. Commercially prepared mixes for cakes, cookies, and other desserts. Bread pudding and other puddings thickened with flour. Products containing brown rice syrup made with barley malt enzyme. Desserts and sweets made with malt flavoring. Seasoning and other foods  Some curry powders, some dry seasoning mixes, some gravy extracts, some meat sauces, some ketchups, some prepared mustards, and horseradish. Certain soy sauces. Malt vinegar. Bouillon and bouillon cubes that contain HVP. Some chip dips, and some chewing gum. Yeast extract. Brewer's yeast. Caramel color. Some medicines and supplements. Some lip glosses and other cosmetics. The items listed may not be a complete list. Talk with your dietitian about what dietary choices are best for you. Summary  Gluten is a protein that is found in wheat, rye, barley, and some other grains. The gluten-free diet includes all foods that do not contain gluten.  If you need help finding gluten-free foods or if   you have questions, talk with your diet and nutrition specialist (registered  dietitian) or your health care provider.  Read all food labels. Gluten is often added to foods. Always check the ingredient list and look for warnings, such as "may contain gluten." This information is not intended to replace advice given to you by your health care provider. Make sure you discuss any questions you have with your health care provider. Document Released: 03/14/2005 Document Revised: 12/28/2015 Document Reviewed: 12/28/2015 Elsevier Interactive Patient Education  2018 [employer]   

## 2020-10-21 NOTE — Progress Notes (Signed)
Subjective:  [name], female    DOB: 18-Apr-1960  Age: 60 y.o. MRN: [id number]  CC: Follow-up (4-5 month f/u- Need B12 shot)   HPI Vanessa [name] presents for GERD - worse F/u B12 deficiency, obesity - seeing Dr Dalbert Garnet C/o incontinence on a diuretic  Outpatient Medications Prior to Visit  Medication Sig Dispense Refill   ALPRAZolam (XANAX) 0.25 MG tablet Take 1 tablet by mouth twice daily as needed for anxiety 60 tablet 1   levothyroxine (SYNTHROID) 88 MCG tablet Take 1 tablet by mouth once daily 90 tablet 3   polyethylene glycol (MIRALAX / GLYCOLAX) 17 g packet Take 17 g by mouth daily.     Vitamin D, Ergocalciferol, (DRISDOL) 1.25 MG (50000 UNIT) CAPS capsule Take 1 capsule (50,000 Units total) by mouth every 7 (seven) days. ([name] not taking: Reported on 10/21/2020) 4 capsule 0   Facility-Administered Medications Prior to Visit  Medication Dose Route Frequency Provider Last Rate Last Admin   cyanocobalamin ((VITAMIN B-12)) injection 1,000 mcg  1,000 mcg Intramuscular Q30 days Madeline Bebout, Georgina Quint, MD   1,000 mcg at 08/26/20 1454    ROS: Review of Systems  Constitutional:  Negative for activity change, appetite change, chills, fatigue and unexpected weight change.  HENT:  Negative for congestion, mouth sores and sinus pressure.   Eyes:  Negative for visual disturbance.  Respiratory:  Negative for cough and chest tightness.   Gastrointestinal:  Negative for abdominal pain and nausea.  Genitourinary:  Negative for difficulty urinating, frequency and vaginal pain.  Musculoskeletal:  Negative for back pain and gait problem.  Skin:  Negative for pallor and rash.  Neurological:  Negative for dizziness, tremors, weakness, numbness and headaches.  Psychiatric/Behavioral:  Negative for confusion and sleep disturbance.    Objective:  BP 128/90 (BP Location: Left Arm)   Pulse 76   Temp 98.3 F (36.8 C) (Oral)   Ht 5\' 2"  (1.575 m)   Wt 177 lb 3.2 oz (80.4 kg)    LMP 03/08/2006   SpO2 97%   BMI 32.41 kg/m   BP Readings from Last 3 Encounters:  10/21/20 128/90  10/08/20 137/90  09/16/20 (!) 150/80    Wt Readings from Last 3 Encounters:  10/21/20 177 lb 3.2 oz (80.4 kg)  10/08/20 172 lb (78 kg)  09/16/20 174 lb (78.9 kg)    Physical Exam Constitutional:      General: She is not in acute distress.    Appearance: She is well-developed. She is obese.  HENT:     Head: Normocephalic.     Right Ear: External ear normal.     Left Ear: External ear normal.     Nose: Nose normal.  Eyes:     General:        Right eye: No discharge.        Left eye: No discharge.     Conjunctiva/sclera: Conjunctivae normal.     Pupils: Pupils are equal, round, and reactive to light.  Neck:     Thyroid: No thyromegaly.     Vascular: No JVD.     Trachea: No tracheal deviation.  Cardiovascular:     Rate and Rhythm: Normal rate and regular rhythm.     Heart sounds: Normal heart sounds.  Pulmonary:     Effort: No respiratory distress.     Breath sounds: No stridor. No wheezing.  Abdominal:     General: Bowel sounds are normal. There is no distension.  Palpations: Abdomen is soft. There is no mass.     Tenderness: There is no abdominal tenderness. There is no guarding or rebound.  Musculoskeletal:        General: No tenderness.     Cervical back: Normal range of motion and neck supple. No rigidity.  Lymphadenopathy:     Cervical: No cervical adenopathy.  Skin:    Findings: No erythema or rash.  Neurological:     Cranial Nerves: No cranial nerve deficit.     Motor: No abnormal muscle tone.     Coordination: Coordination normal.     Deep Tendon Reflexes: Reflexes normal.  Psychiatric:        Behavior: Behavior normal.        Thought Content: Thought content normal.        Judgment: Judgment normal.    Lab Results  Component Value Date   WBC 12.4 (H) 08/13/2020   HGB 14.7 08/13/2020   HCT 45.0 08/13/2020   PLT 343.0 03/18/2020   GLUCOSE 77  08/13/2020   CHOL 285 (H) 08/13/2020   TRIG 143 08/13/2020   HDL 42 08/13/2020   LDLDIRECT 199.0 10/15/2018   LDLCALC 216 (H) 08/13/2020   ALT 13 08/13/2020   AST 13 08/13/2020   NA 141 08/13/2020   K 4.6 08/13/2020   CL 102 08/13/2020   CREATININE 0.93 08/13/2020   BUN 17 08/13/2020   CO2 20 08/13/2020   TSH 2.640 08/13/2020   HGBA1C 5.8 (H) 08/13/2020   MICROALBUR 39.7 (H) 05/14/2020    US RENAL  Result Date: 04/08/2020 CLINICAL DATA:  Decreasing GFR EXAM: RENAL / URINARY TRACT ULTRASOUND COMPLETE COMPARISON:  None. FINDINGS: Right Kidney: Renal measurements: 10.2 x 5.3 x 5.2 cm = volume: 145 mL. Diffusely increased echogenicity. No hydronephrosis. 1.3 x 1.1 x 0.8 cm interpolar renal cyst. Left Kidney: Renal measurements: 10.8 x 5.3 x 4.6 cm = volume: 136 mL. Increased echogenicity. No mass or hydronephrosis visualized. Bladder: Appears normal for degree of bladder distention. Bilateral ureteral jets are visualized. Other: None. IMPRESSION: 1. No hydronephrosis. 2. Diffusely increased bilateral renal echogenicity, as can be seen with medical renal disease. Electronically Signed   By: Maudry Mayhew MD   On: 04/08/2020 21:07    Assessment & Plan:     Vanessa Primes, MD

## 2020-10-21 NOTE — Assessment & Plan Note (Signed)
Xanax prn  Potential benefits of a long term benzodiazepines  use as well as potential risks  and complications were explained to the patient and were aknowledged. 

## 2020-10-21 NOTE — Assessment & Plan Note (Addendum)
Worse. Start Protonix.  Follow-up with gastroenterology if not better

## 2020-10-21 NOTE — Assessment & Plan Note (Addendum)
Worse ?dermatitis herpetiformis vs other Gluten free diet

## 2020-10-22 ENCOUNTER — Ambulatory Visit (INDEPENDENT_AMBULATORY_CARE_PROVIDER_SITE_OTHER): Payer: BC Managed Care – PPO | Admitting: Family Medicine

## 2020-10-22 ENCOUNTER — Encounter (INDEPENDENT_AMBULATORY_CARE_PROVIDER_SITE_OTHER): Payer: Self-pay | Admitting: Family Medicine

## 2020-10-22 VITALS — BP 138/77 | HR 80 | Temp 98.3°F | Ht 62.0 in | Wt 171.0 lb

## 2020-10-22 DIAGNOSIS — E559 Vitamin D deficiency, unspecified: Secondary | ICD-10-CM | POA: Diagnosis not present

## 2020-10-22 DIAGNOSIS — Z6832 Body mass index (BMI) 32.0-32.9, adult: Secondary | ICD-10-CM | POA: Diagnosis not present

## 2020-10-22 DIAGNOSIS — Z9189 Other specified personal risk factors, not elsewhere classified: Secondary | ICD-10-CM

## 2020-10-22 DIAGNOSIS — I10 Essential (primary) hypertension: Secondary | ICD-10-CM

## 2020-10-22 DIAGNOSIS — E669 Obesity, unspecified: Secondary | ICD-10-CM

## 2020-10-22 MED ORDER — VITAMIN D (ERGOCALCIFEROL) 1.25 MG (50000 UNIT) PO CAPS: 50000.0000 [IU] | ORAL_CAPSULE | ORAL | 0 refills | Status: DC

## 2020-10-27 NOTE — Progress Notes (Signed)
Chief Complaint:   OBESITY Vanessa [name] is here to discuss her progress with her obesity treatment plan along with follow-up of her obesity related diagnoses. Vanessa [name] is on the Category 2 Plan and keeping a food journal and adhering to recommended goals of 350-550 calories and 40+ grams of protein at supper daily and states she is following her eating plan approximately 80-85% of the time. Vanessa [name] states she is doing 0 minutes 0 times per week.  Today's visit was #: 5 Starting weight: 178 lbs Starting date: 08/13/2020 Today's weight: 171 lbs Today's date: 10/22/2020 Total lbs lost to date: 7 Total lbs lost since last in-office visit: 1  Interim History: Arshdeep notes she sometimes snacks when her grandkids come to visit on the snacks she [name] bought for them. She [name] also been on vacation and was off the plan somewhat. She [name] a treadmill and elliptical at home. She will be taking care of her grandchildren this weekend.  Subjective:   1. Vitamin D deficiency Vanessa [name]'s last Vit D level was low at 22. She is on weekly prescription Vit D. She was wondering if she needs to continue Vit D.  Lab Results  Component Value Date   VD25OH 22.0 (L) 08/13/2020   VD25OH 35 04/10/2020   VD25OH 46 10/21/2019   2. Essential hypertension Vanessa [name] stopped taking hydrochlorothiazide due to GERD. Her blood pressure is within normal limits today. She denies chest pain or shortness of breath. She [name] had several elevated blood pressures.  BP Readings from Last 3 Encounters:  10/22/20 138/77  10/21/20 128/90  10/08/20 137/90   Lab Results  Component Value Date   CREATININE 0.93 08/13/2020   CREATININE 1.23 (H) 05/14/2020   CREATININE 1.09 03/18/2020   3. At risk for osteoporosis Vanessa [name] is at higher risk of osteopenia and osteoporosis due to Vitamin D deficiency.   Assessment/Plan:   1. Vitamin D deficiency We will refill prescription Vitamin D for 1 month. Vanessa [name] will follow-up for routine  testing of Vitamin D, at least 2-3 times per year to avoid over-replacement.  - Vitamin D, Ergocalciferol, (DRISDOL) 1.25 MG (50000 UNIT) CAPS capsule; Take 1 capsule (50,000 Units total) by mouth every 7 (seven) days.  Dispense: 4 capsule; Refill: 0  2. Essential hypertension Vanessa [name] is to hold hydrochlorothiazide for now, and will continue to monitor. She will continue working on healthy weight loss and exercise to improve blood pressure control.   3. At risk for osteoporosis Vanessa [name] was given approximately 15 minutes of osteoporosis prevention counseling today. Vanessa [name] is at risk for osteopenia and osteoporosis due to her Vitamin D deficiency. She was encouraged to take her Vitamin D and follow her higher calcium diet and increase strengthening exercise to help strengthen her bones and decrease her risk of osteopenia and osteoporosis.  Repetitive spaced learning was employed today to elicit superior memory formation and behavioral change.  4. Obesity with current BMI 31.27 Vanessa [name] is currently in the action stage of change. As such, her goal is to continue with weight loss efforts. She [name] agreed to the Category 2 Plan and keeping a food journal and adhering to recommended goals of 350-550 calories and 40+ grams of protein at supper daily.   Planned healthy snacks for grand kids this weekend.  Exercise goals: No exercise [name] been prescribed at this time. She will consider starting exercise.  Behavioral modification strategies: increasing lean protein intake and better snacking choices.  Vanessa [name] agreed to follow-up with our clinic in 2  to 3 weeks.  Objective:   Blood pressure 138/77, pulse 80, temperature 98.3 F (36.8 C), height '5\' 2"'$  (1.575 m), weight 171 lb (77.6 kg), last menstrual period 03/08/2006, SpO2 96 %. Body mass index is 31.28 kg/m.  General: Cooperative, alert, well developed, in no acute distress. HEENT: Conjunctivae and lids unremarkable. Cardiovascular: Regular  rhythm.  Lungs: Normal work of breathing. Neurologic: No focal deficits.   Lab Results  Component Value Date   CREATININE 0.93 08/13/2020   BUN 17 08/13/2020   NA 141 08/13/2020   K 4.6 08/13/2020   CL 102 08/13/2020   CO2 20 08/13/2020   Lab Results  Component Value Date   ALT 13 08/13/2020   AST 13 08/13/2020   ALKPHOS 74 08/13/2020   BILITOT 0.4 08/13/2020   Lab Results  Component Value Date   HGBA1C 5.8 (H) 08/13/2020   HGBA1C 5.7 07/21/2015   HGBA1C 5.7 01/22/2013   Lab Results  Component Value Date   INSULIN 18.0 08/13/2020   Lab Results  Component Value Date   TSH 2.640 08/13/2020   Lab Results  Component Value Date   CHOL 285 (H) 08/13/2020   HDL 42 08/13/2020   LDLCALC 216 (H) 08/13/2020   LDLDIRECT 199.0 10/15/2018   TRIG 143 08/13/2020   CHOLHDL 7.7 (H) 10/21/2019   Lab Results  Component Value Date   VD25OH 22.0 (L) 08/13/2020   VD25OH 35 04/10/2020   VD25OH 46 10/21/2019   Lab Results  Component Value Date   WBC 12.4 (H) 08/13/2020   HGB 14.7 08/13/2020   HCT 45.0 08/13/2020   MCV 87 08/13/2020   PLT 343.0 03/18/2020   No results found for: IRON, TIBC, FERRITIN  Attestation Statements:   Reviewed by clinician on day of visit: allergies, medications, problem list, medical history, surgical history, family history, social history, and previous encounter notes.   Wilhemena Durie, am acting as Location manager for [employer], FNP-C.  I have reviewed the above documentation for accuracy and completeness, and I agree with the above. -  Georgianne Fick, FNP

## 2020-10-28 DIAGNOSIS — Z6832 Body mass index (BMI) 32.0-32.9, adult: Secondary | ICD-10-CM | POA: Insufficient documentation

## 2020-10-28 DIAGNOSIS — E669 Obesity, unspecified: Secondary | ICD-10-CM | POA: Insufficient documentation

## 2020-11-02 NOTE — Assessment & Plan Note (Signed)
Continue with oral vitamin D 2000 units daily

## 2020-11-02 NOTE — Assessment & Plan Note (Signed)
Continue with vitamin B12 injections

## 2020-11-02 NOTE — Assessment & Plan Note (Signed)
Continue with good hydration.  Follow-up with nephrology

## 2020-11-02 NOTE — Assessment & Plan Note (Signed)
Blood pressure is normal at home

## 2020-11-09 ENCOUNTER — Ambulatory Visit: Payer: Self-pay

## 2020-11-11 ENCOUNTER — Other Ambulatory Visit: Payer: Self-pay

## 2020-11-11 ENCOUNTER — Ambulatory Visit
Admission: RE | Admit: 2020-11-11 | Discharge: 2020-11-11 | Disposition: A | Discharge status: Home / Self Care | Payer: BC Managed Care – PPO | Source: Ambulatory Visit | Attending: Internal Medicine | Admitting: Internal Medicine

## 2020-11-11 VITALS — BP 140/81 | HR 77 | Temp 98.1°F | Resp 18

## 2020-11-11 DIAGNOSIS — J029 Acute pharyngitis, unspecified: Secondary | ICD-10-CM | POA: Insufficient documentation

## 2020-11-11 DIAGNOSIS — J069 Acute upper respiratory infection, unspecified: Secondary | ICD-10-CM | POA: Diagnosis present

## 2020-11-11 LAB — POCT RAPID STREP A (OFFICE): Rapid Strep A Screen: NEGATIVE

## 2020-11-11 MED ORDER — BENZONATATE 100 MG PO CAPS: 100.0000 mg | ORAL_CAPSULE | Freq: Three times a day (TID) | ORAL | 0 refills | Status: DC | PRN

## 2020-11-11 MED ORDER — FLUCONAZOLE 150 MG PO TABS: 150.0000 mg | ORAL_TABLET | Freq: Every day | ORAL | 0 refills | Status: DC

## 2020-11-11 MED ORDER — AMOXICILLIN 875 MG PO TABS: 875.0000 mg | ORAL_TABLET | Freq: Two times a day (BID) | ORAL | 0 refills | Status: AC

## 2020-11-11 NOTE — ED Triage Notes (Signed)
Patient c/o possible sinus infection, head congestion, chest congestion, productive cough, negative PCR COVID test yesterday, afebrile.  Patient has taken Zyrtec and Mucinex.  Patient is vaccinated for COVID.

## 2020-11-11 NOTE — Discharge Instructions (Signed)
You have been prescribed amoxicillin antibiotic to treat upper respiratory infection.  You have also been prescribed benzonatate to take as needed for cough.  Rapid strep was negative in urgent care today.  Throat culture and COVID-19 viral swab are pending.  We will call if these are positive.

## 2020-11-11 NOTE — ED Provider Notes (Signed)
[facility]    CSN: [id number] Arrival date & time: 11/11/20  0817      History   Chief Complaint Chief Complaint  Patient presents with   Possible Sinus Infection    HPI Vanessa [name] is a 60 y.o. female.   Patient presents with 6-day history of nasal congestion, cough, sore throat.  Denies any known fevers.  Has been exposed to COVID-19 from coworkers.  She has been taking Zyrtec and Mucinex over-the-counter with no relief of symptoms.  Denies any chest pain or shortness of breath.  Describes cough as productive with yellow sputum.  Patient took an at home COVID-19 test 6 days ago that was negative.  Also had a COVID-19 PCR test at a pharmacy yesterday that was also negative.    Past Medical History:  Diagnosis Date   Allergy    rhinitis   Anxiety    B12 deficiency    Cyst    gangeous- left hand   Depression    Hyperlipidemia    Hypothyroidism    Hypothyroidism    Kidney problem    Tobacco abuse    Vitamin D deficiency 11/2017   Value 21    Patient Active Problem List   Diagnosis Date Noted   Class 1 obesity with serious comorbidity and body mass index (BMI) of 32.0 to 32.9 in adult 10/28/2020   GERD (gastroesophageal reflux disease) 10/21/2020   Proteinuria 05/19/2020   Renal insufficiency 05/19/2020   Bowel habit changes 03/18/2020   Hand eczema 02/17/2020   Plantar fasciitis, right 12/07/2018   Flank pain 02/02/2018   Acute pain of right shoulder 12/17/2017   Strep throat 09/17/2016   Tick bite of back 09/17/2016   Menopause 03/25/2016   Hypertension, renal disease, stage 1-4 or unspecified chronic kidney disease 07/21/2015   Vitamin D deficiency 07/21/2015   Cough 06/28/2015   Trigger thumb of left hand 11/20/2014   Trigger thumb 04/23/2014   Rash and nonspecific skin eruption 04/23/2014   Abscess of skin of abdomen 04/09/2014   Onychomycosis 04/09/2014   Pain of right thumb 04/04/2014   Smoker 06/26/2013   Eye pain 11/09/2012    Right-sided chest wall pain 09/11/2012   Well adult exam 12/28/2010   PSORIASIS 02/02/2010   SINUSITIS, ACUTE 08/28/2009   B12 deficiency 02/10/2009   Anxiety disorder 08/11/2008   DEPRESSION 08/11/2008   Acute upper respiratory infection 06/03/2008   INSOMNIA, PERSISTENT 02/14/2008   PHARYNGITIS 02/14/2008   FATIGUE 02/14/2008   SWEATING 02/14/2008   TOBACCO USE DISORDER/SMOKER-SMOKING CESSATION DISCUSSED 06/08/2007   Hypothyroidism 03/07/2007   Allergic rhinitis 03/07/2007   WEIGHT GAIN 03/07/2007   ABNORMAL GLUCOSE NEC 03/07/2007   Dyslipidemia 10/27/2006    Past Surgical History:  Procedure Laterality Date   [place] CYST EXCISION  2009   right ankle    OB History     Gravida  2   Para  2   Term      Preterm      AB      Living  2      SAB      IAB      Ectopic      Multiple      Live Births               Home Medications    Prior to Admission medications   Medication Sig Start Date End Date Taking? Authorizing  Provider  ALPRAZolam (XANAX) 0.25 MG tablet Take 1 tablet (0.25 mg total) by mouth 2 (two) times daily as needed. 10/21/20  Yes Plotnikov, Evie Lacks, MD  amoxicillin (AMOXIL) 875 MG tablet Take 1 tablet (875 mg total) by mouth 2 (two) times daily for 10 days. 11/11/20 11/21/20 Yes Odis Luster, FNP  benzonatate (TESSALON) 100 MG capsule Take 1 capsule (100 mg total) by mouth every 8 (eight) hours as needed for cough. 11/11/20  Yes Odis Luster, FNP  fluconazole (DIFLUCAN) 150 MG tablet Take 1 tablet (150 mg total) by mouth daily. 11/11/20  Yes Odis Luster, FNP  levothyroxine (SYNTHROID) 88 MCG tablet Take 1 tablet by mouth once daily 12/09/19  Yes Plotnikov, Evie Lacks, MD  Vitamin D, Ergocalciferol, (DRISDOL) 1.25 MG (50000 UNIT) CAPS capsule Take 1 capsule (50,000 Units total) by mouth every 7 (seven) days. 10/22/20  Yes Whitmire, Joneen Boers, FNP  doxycycline (VIBRA-TABS) 100 MG tablet Take 1  tablet (100 mg total) by mouth 2 (two) times daily. 10/21/20   Plotnikov, Evie Lacks, MD  pantoprazole (PROTONIX) 40 MG tablet Take 1 tablet (40 mg total) by mouth daily. 10/21/20   Plotnikov, Evie Lacks, MD  polyethylene glycol (MIRALAX / GLYCOLAX) 17 g packet Take 17 g by mouth daily.    [provider]    Family History Family History  Problem Relation Age of Onset   Thyroid disease Mother        Low   Other Mother        B12 deficiency   Diabetes Mother    Hyperlipidemia Mother    Heart disease Father 32       CAD   Hyperlipidemia Father    Heart failure Father    Sudden death Father    Coronary artery disease Other    Hyperlipidemia Other    Psoriasis Son    Colon cancer Neg Hx     Social History Social History   Tobacco Use   Smoking status: Every Day    Packs/day: 0.25    Types: Cigarettes   Smokeless tobacco: Never  Vaping Use   Vaping Use: Never used  Substance Use Topics   Alcohol use: Not Currently    Alcohol/week: 0.0 standard drinks   Drug use: No     Allergies   Atorvastatin, Crestor [rosuvastatin calcium], Ezetimibe-simvastatin, Hctz [hydrochlorothiazide], and Hydrocodone bit-homatrop mbr   Review of Systems Review of Systems Per HPI  Physical Exam Triage Vital Signs ED Triage Vitals  Enc Vitals Group     BP 11/11/20 0833 140/81     Pulse Rate 11/11/20 0833 77     Resp 11/11/20 0833 18     Temp 11/11/20 0833 98.1 F (36.7 C)     Temp Source 11/11/20 0833 Oral     SpO2 11/11/20 0833 97 %     Weight --      Height --      Head Circumference --      Peak Flow --      Pain Score 11/11/20 0835 0     Pain Loc --      Pain Edu? --      Excl. in [place]? --    No data found.  Updated Vital Signs BP 140/81 (BP Location: Left Arm)   Pulse 77   Temp 98.1 F (36.7 C) (Oral)   Resp 18   LMP 03/08/2006   SpO2 97%   Visual Acuity Right Eye Distance:   Left  Eye Distance:   Bilateral Distance:    Right Eye Near:   Left Eye Near:     Bilateral Near:     Physical Exam Constitutional:      General: She is not in acute distress.    Appearance: Normal appearance.  HENT:     Head: Normocephalic and atraumatic.     Right Ear: Tympanic membrane and ear canal normal.     Left Ear: Tympanic membrane and ear canal normal.     Nose: Congestion present.     Mouth/Throat:     Mouth: Mucous membranes are moist.     Pharynx: Posterior oropharyngeal erythema present.  Eyes:     Extraocular Movements: Extraocular movements intact.     Conjunctiva/sclera: Conjunctivae normal.     Pupils: Pupils are equal, round, and reactive to light.  Cardiovascular:     Rate and Rhythm: Normal rate and regular rhythm.     Pulses: Normal pulses.     Heart sounds: Normal heart sounds.  Pulmonary:     Effort: Pulmonary effort is normal. No respiratory distress.     Breath sounds: Normal breath sounds. No wheezing.  Abdominal:     General: Abdomen is flat. Bowel sounds are normal.     Palpations: Abdomen is soft.  Musculoskeletal:        General: Normal range of motion.     Cervical back: Normal range of motion.  Skin:    General: Skin is warm and dry.  Neurological:     General: No focal deficit present.     Mental Status: She is alert and oriented to person, place, and time. Mental status is at baseline.  Psychiatric:        Mood and Affect: Mood normal.        Behavior: Behavior normal.     UC Treatments / Results  Labs (all labs ordered are listed, but only abnormal results are displayed) Labs Reviewed  CULTURE, GROUP A STREP ([place])  NOVEL CORONAVIRUS, NAA  POCT RAPID STREP A (OFFICE)    EKG   Radiology No results found.  Procedures Procedures (including critical care time)  Medications Ordered in UC Medications - No data to display  Initial Impression / Assessment and Plan / UC Course  I have reviewed the triage vital signs and the nursing notes.  Pertinent labs & imaging results that were available during my  care of the patient were reviewed by me and considered in my medical decision making (see chart for details).     Will treat upper respiratory infection with amoxicillin antibiotic due to duration of symptoms and symptoms being refractory to over-the-counter medications.  Rapid strep test was negative in urgent care today.  Throat culture is pending.  Did not think additional COVID-19 testing was necessary due to negative COVID-19 test, but patient wishes to have additional COVID-19 testing.  COVID-19 test is pending.  Benzonatate prescribed for cough medication.  Unable to prescribe any additional cough medication due to patient taking alprazolam currently and safety risk.  Patient requesting Diflucan due to antibiotics always causing yeast infection.  Discussed over-the-counter medications with patient.Discussed strict return precautions. Patient verbalized understanding and is agreeable with plan.  Final Clinical Impressions(s) / UC Diagnoses   Final diagnoses:  Acute upper respiratory infection  Sore throat     Discharge Instructions      You have been prescribed amoxicillin antibiotic to treat upper respiratory infection.  You have also been prescribed benzonatate to take as needed  for cough.  Rapid strep was negative in urgent care today.  Throat culture and COVID-19 viral swab are pending.  We will call if these are positive.     ED Prescriptions     Medication Sig Dispense Auth. Provider   benzonatate (TESSALON) 100 MG capsule Take 1 capsule (100 mg total) by mouth every 8 (eight) hours as needed for cough. 21 capsule Odis Luster, FNP   fluconazole (DIFLUCAN) 150 MG tablet Take 1 tablet (150 mg total) by mouth daily. 1 tablet Odis Luster, FNP   amoxicillin (AMOXIL) 875 MG tablet Take 1 tablet (875 mg total) by mouth 2 (two) times daily for 10 days. 20 tablet Odis Luster, FNP      PDMP not reviewed this encounter.   Odis Luster, [place] 11/11/20 [phone]

## 2020-11-12 LAB — SARS-COV-2, NAA 2 DAY TAT

## 2020-11-12 LAB — NOVEL CORONAVIRUS, NAA: SARS-CoV-2, NAA: NOT DETECTED

## 2020-11-14 LAB — CULTURE, GROUP A STREP (THRC)

## 2020-11-19 ENCOUNTER — Ambulatory Visit (INDEPENDENT_AMBULATORY_CARE_PROVIDER_SITE_OTHER): Payer: BC Managed Care – PPO | Admitting: Family Medicine

## 2020-11-19 ENCOUNTER — Encounter (INDEPENDENT_AMBULATORY_CARE_PROVIDER_SITE_OTHER): Payer: Self-pay | Admitting: Family Medicine

## 2020-11-19 ENCOUNTER — Other Ambulatory Visit: Payer: Self-pay

## 2020-11-19 VITALS — BP 131/82 | HR 75 | Temp 98.1°F | Ht 62.0 in | Wt 170.0 lb

## 2020-11-19 DIAGNOSIS — Z9189 Other specified personal risk factors, not elsewhere classified: Secondary | ICD-10-CM | POA: Diagnosis not present

## 2020-11-19 DIAGNOSIS — Z6832 Body mass index (BMI) 32.0-32.9, adult: Secondary | ICD-10-CM

## 2020-11-19 DIAGNOSIS — E669 Obesity, unspecified: Secondary | ICD-10-CM | POA: Diagnosis not present

## 2020-11-19 DIAGNOSIS — I10 Essential (primary) hypertension: Secondary | ICD-10-CM | POA: Diagnosis not present

## 2020-11-19 DIAGNOSIS — E559 Vitamin D deficiency, unspecified: Secondary | ICD-10-CM

## 2020-11-19 MED ORDER — VITAMIN D (ERGOCALCIFEROL) 1.25 MG (50000 UNIT) PO CAPS: 50000.0000 [IU] | ORAL_CAPSULE | ORAL | 0 refills | Status: DC

## 2020-11-23 ENCOUNTER — Ambulatory Visit: Payer: BC Managed Care – PPO

## 2020-11-23 NOTE — Progress Notes (Signed)
Chief Complaint:   OBESITY Vanessa [name] is here to discuss her progress with her obesity treatment plan along with follow-up of her obesity related diagnoses. Vanessa [name] is on the Category 2 Plan and keeping a food journal and adhering to recommended goals of 350-550 calories and 40+ grams of protein at supper daily and states she is following her eating plan approximately 75% of the time. Vanessa [name] states she is doing 0 minutes 0 times per week.  Today's visit was #: 6 Starting weight: 178 lbs Starting date: 08/13/2020 Today's weight: 170 lbs Today's date: 11/19/2020 Total lbs lost to date: 8 Total lbs lost since last in-office visit: 1  Interim History: Vanessa [name] has had a COVID negative upper respiratory infection, and she is finishing up her antibiotic. She was off track on her eating, but she is already getting back on track. She is going on vacation soon and she is working on damage control strategies.  Subjective:   1. Essential hypertension Vanessa [name]'s blood pressure continues to improve with diet and exercise. She denies signs of hypotension.  2. Vitamin D deficiency Vanessa [name] is stable, but her level is not yet at goal. She denies nausea, vomiting, or muscle weakness.  3. At risk for diarrhea Vanessa [name] is at higher risk of diarrhea due to antibiotic medications.  Assessment/Plan:   1. Essential hypertension Vanessa [name] will continue with diet, exercise, and her medications to improve blood pressure control. We will watch for signs of hypotension as she continues her lifestyle modifications.  2. Vitamin D deficiency Low Vitamin D level contributes to fatigue and are associated with obesity, breast, and colon cancer. We will refill prescription Vitamin D for 1 month. Vanessa [name] will follow-up for routine testing of Vitamin D, at least 2-3 times per year to avoid over-replacement.  - Vitamin D, Ergocalciferol, (DRISDOL) 1.25 MG (50000 UNIT) CAPS capsule; Take 1 capsule (50,000 Units total) by  mouth every 7 (seven) days.  Dispense: 4 capsule; Refill: 0  3. At risk for diarrhea Vanessa [name] was given approximately 15 minutes of diarrhea prevention counseling today. She is 60 y.o. female and has risk factors for diarrhea including medications and changes in diet. We discussed intensive lifestyle modifications today with an emphasis on specific weight loss instructions including dietary strategies.   Repetitive spaced learning was employed today to elicit superior memory formation and behavioral change.  4. Obesity with current BMI 31.1 Vanessa [name] is currently in the action stage of change. As such, her goal is to continue with weight loss efforts. She has agreed to the Category 2 Plan and keeping a food journal and adhering to recommended goals of 350-550 calories and 40+ grams of protein at supper daily.   Behavioral modification strategies: increasing lean protein intake and holiday eating strategies .  Vanessa [name] has agreed to follow-up with our clinic in 3 to 4 weeks. She was informed of the importance of frequent follow-up visits to maximize her success with intensive lifestyle modifications for her multiple health conditions.   Objective:   Blood pressure 131/82, pulse 75, temperature 98.1 F (36.7 C), height '5\' 2"'$  (1.575 m), weight 170 lb (77.1 kg), last menstrual period 03/08/2006, SpO2 96 %. Body mass index is 31.09 kg/m.  General: Cooperative, alert, well developed, in no acute distress. HEENT: Conjunctivae and lids unremarkable. Cardiovascular: Regular rhythm.  Lungs: Normal work of breathing. Neurologic: No focal deficits.   Lab Results  Component Value Date   CREATININE 0.93 08/13/2020   BUN 17 08/13/2020   NA 141 08/13/2020  K 4.6 08/13/2020   CL 102 08/13/2020   CO2 20 08/13/2020   Lab Results  Component Value Date   ALT 13 08/13/2020   AST 13 08/13/2020   ALKPHOS 74 08/13/2020   BILITOT 0.4 08/13/2020   Lab Results  Component Value Date   HGBA1C 5.8 (H)  08/13/2020   HGBA1C 5.7 07/21/2015   HGBA1C 5.7 01/22/2013   Lab Results  Component Value Date   INSULIN 18.0 08/13/2020   Lab Results  Component Value Date   TSH 2.640 08/13/2020   Lab Results  Component Value Date   CHOL 285 (H) 08/13/2020   HDL 42 08/13/2020   LDLCALC 216 (H) 08/13/2020   LDLDIRECT 199.0 10/15/2018   TRIG 143 08/13/2020   CHOLHDL 7.7 (H) 10/21/2019   Lab Results  Component Value Date   VD25OH 22.0 (L) 08/13/2020   VD25OH 35 04/10/2020   VD25OH 46 10/21/2019   Lab Results  Component Value Date   WBC 12.4 (H) 08/13/2020   HGB 14.7 08/13/2020   HCT 45.0 08/13/2020   MCV 87 08/13/2020   PLT 343.0 03/18/2020   No results found for: IRON, TIBC, FERRITIN  Attestation Statements:   Reviewed by clinician on day of visit: allergies, medications, problem list, medical history, surgical history, family history, social history, and previous encounter notes.   I, Trixie Dredge, am acting as transcriptionist for Dennard Nip, MD.  I have reviewed the above documentation for accuracy and completeness, and I agree with the above. -  Dennard Nip, MD

## 2020-11-26 ENCOUNTER — Ambulatory Visit (INDEPENDENT_AMBULATORY_CARE_PROVIDER_SITE_OTHER): Payer: BC Managed Care – PPO

## 2020-11-26 ENCOUNTER — Other Ambulatory Visit: Payer: Self-pay

## 2020-11-26 DIAGNOSIS — E538 Deficiency of other specified B group vitamins: Secondary | ICD-10-CM

## 2020-11-26 MED ORDER — CYANOCOBALAMIN 1000 MCG/ML IJ SOLN
1000.0000 ug | Freq: Once | INTRAMUSCULAR | Status: AC
  Administered 2020-11-26: 1000 ug via INTRAMUSCULAR

## 2020-11-26 NOTE — Progress Notes (Addendum)
B12 Injection given w/o any complications. Medical screening examination/treatment/procedure(s) were performed by non-physician practitioner and as supervising physician I was immediately available for consultation/collaboration.  I agree with above. Lew Dawes, MD

## 2020-12-01 ENCOUNTER — Other Ambulatory Visit: Payer: Self-pay

## 2020-12-01 ENCOUNTER — Other Ambulatory Visit: Payer: Self-pay | Admitting: Internal Medicine

## 2020-12-15 ENCOUNTER — Ambulatory Visit (INDEPENDENT_AMBULATORY_CARE_PROVIDER_SITE_OTHER): Payer: BC Managed Care – PPO | Admitting: Family Medicine

## 2020-12-28 ENCOUNTER — Ambulatory Visit (INDEPENDENT_AMBULATORY_CARE_PROVIDER_SITE_OTHER): Payer: BC Managed Care – PPO

## 2020-12-28 ENCOUNTER — Other Ambulatory Visit: Payer: Self-pay

## 2020-12-28 DIAGNOSIS — E538 Deficiency of other specified B group vitamins: Secondary | ICD-10-CM | POA: Diagnosis not present

## 2020-12-28 NOTE — Progress Notes (Addendum)
Pt came into the office today to receive her b12 shot. She tolerated the injection well  Medical screening examination/treatment/procedure(s) were performed by non-physician practitioner and as supervising physician I was immediately available for consultation/collaboration.  I agree with above. Lew Dawes, MD

## 2020-12-30 ENCOUNTER — Other Ambulatory Visit: Payer: Self-pay

## 2020-12-30 ENCOUNTER — Encounter: Payer: Self-pay | Admitting: Nurse Practitioner

## 2020-12-30 ENCOUNTER — Ambulatory Visit: Payer: BC Managed Care – PPO | Admitting: Nurse Practitioner

## 2020-12-30 VITALS — BP 130/80

## 2020-12-30 DIAGNOSIS — N760 Acute vaginitis: Secondary | ICD-10-CM | POA: Diagnosis not present

## 2020-12-30 DIAGNOSIS — N898 Other specified noninflammatory disorders of vagina: Secondary | ICD-10-CM | POA: Diagnosis not present

## 2020-12-30 DIAGNOSIS — R300 Dysuria: Secondary | ICD-10-CM | POA: Diagnosis not present

## 2020-12-30 LAB — URINALYSIS, COMPLETE W/RFL CULTURE
Bacteria, UA: NONE SEEN /HPF
Bilirubin Urine: NEGATIVE
Glucose, UA: NEGATIVE
Hyaline Cast: NONE SEEN /LPF
Ketones, ur: NEGATIVE
Leukocyte Esterase: NEGATIVE
Nitrites, Initial: NEGATIVE
RBC / HPF: NONE SEEN /HPF (ref 0–2)
Specific Gravity, Urine: 1.015 (ref 1.001–1.035)
WBC, UA: NONE SEEN /HPF (ref 0–5)
pH: 5.5 (ref 5.0–8.0)

## 2020-12-30 LAB — WET PREP FOR TRICH, YEAST, CLUE

## 2020-12-30 LAB — NO CULTURE INDICATED

## 2020-12-30 MED ORDER — NYSTATIN-TRIAMCINOLONE 100000-0.1 UNIT/GM-% EX OINT: 1.0000 "application " | TOPICAL_OINTMENT | Freq: Two times a day (BID) | CUTANEOUS | 0 refills | Status: DC

## 2020-12-30 NOTE — Progress Notes (Signed)
   Acute Office Visit  Subjective:    Patient ID: Vanessa [name], female    DOB: Jun 20, 1960, 60 y.o.   MRN: [id number]   HPI 60 y.o. presents today for vaginal itching, burning with urination, and mild pelvic discomfort. Vaginal itching has been off and on for a few months. She was prescribed antibiotics at the time symptoms first started and assumed it was a yeast infection.  She used OTC creams and leftover diflucan with some improvement but itching remains but does vary in intensity. No discharge or odor. Used Monistat 5 days ago. She has burning with urination but thinks it is from vaginal discomfort. Burning is mostly with wiping.    Review of Systems  Constitutional: Negative.   Gastrointestinal: Negative.   Genitourinary:  Positive for dysuria, pelvic pain (pelvic discomfort/bloating) and vaginal pain (irritation/itching). Negative for decreased urine volume, difficulty urinating, flank pain, frequency, hematuria and vaginal discharge.      Objective:    Physical Exam Constitutional:      Appearance: Normal appearance.  Abdominal:     Tenderness: There is no abdominal tenderness. There is no right CVA tenderness or left CVA tenderness.  Genitourinary:    General: Normal vulva.     Vagina: Normal.     Cervix: Normal.    BP 130/80   LMP 03/08/2006  Wt Readings from Last 3 Encounters:  11/19/20 170 lb (77.1 kg)  10/22/20 171 lb (77.6 kg)  10/21/20 177 lb 3.2 oz (80.4 kg)   UA negative leukocytes, negative nitrates, 1+ protein, SG 1.015, Yellow/clear. Microscopic: wbc none, rbc none, bacteria none  Wet prep negative     Assessment & Plan:   Problem List Items Addressed This Visit   None Visit Diagnoses     Acute vaginitis    -  Primary   Relevant Medications   nystatin-triamcinolone ointment (MYCOLOG)   Vagina itching       Relevant Orders   WET PREP FOR [place], YEAST, CLUE   Dysuria       Relevant Orders   Urinalysis,Complete w/RFL Culture      Plan:  Reassurance provided on normal UA, wet prep, and exam. Mycolog twice daily x 7 days. We discussed postmenopausal changes and likelihood that symptoms are from atrophy. Recommend using OTC lubricants such as Replens or coconut oil 2-3 times weekly. We did briefly discuss vaginal estrogen if symptoms persist. She is agreeable to plan.      Tamela Gammon DNP, 10:41 AM 12/30/2020

## 2021-01-07 ENCOUNTER — Encounter (INDEPENDENT_AMBULATORY_CARE_PROVIDER_SITE_OTHER): Payer: Self-pay | Admitting: Family Medicine

## 2021-01-07 ENCOUNTER — Other Ambulatory Visit: Payer: Self-pay

## 2021-01-07 ENCOUNTER — Ambulatory Visit (INDEPENDENT_AMBULATORY_CARE_PROVIDER_SITE_OTHER): Payer: BC Managed Care – PPO | Admitting: Family Medicine

## 2021-01-07 VITALS — BP 144/84 | HR 76 | Temp 98.2°F | Ht 62.0 in | Wt 170.0 lb

## 2021-01-07 DIAGNOSIS — Z9189 Other specified personal risk factors, not elsewhere classified: Secondary | ICD-10-CM

## 2021-01-07 DIAGNOSIS — E559 Vitamin D deficiency, unspecified: Secondary | ICD-10-CM

## 2021-01-07 DIAGNOSIS — R030 Elevated blood-pressure reading, without diagnosis of hypertension: Secondary | ICD-10-CM | POA: Diagnosis not present

## 2021-01-07 DIAGNOSIS — Z6832 Body mass index (BMI) 32.0-32.9, adult: Secondary | ICD-10-CM | POA: Diagnosis not present

## 2021-01-07 DIAGNOSIS — E669 Obesity, unspecified: Secondary | ICD-10-CM

## 2021-01-07 MED ORDER — VITAMIN D (ERGOCALCIFEROL) 1.25 MG (50000 UNIT) PO CAPS: 50000.0000 [IU] | ORAL_CAPSULE | ORAL | 0 refills | Status: DC

## 2021-01-07 NOTE — Progress Notes (Signed)
Chief Complaint:   OBESITY Vanessa [name] is here to discuss her progress with her obesity treatment plan along with follow-up of her obesity related diagnoses. Vanessa [name] is on the Category 2 Plan and keeping a food journal and adhering to recommended goals of 350-550 calories and 40+ grams of protein at supper daily and states she is following her eating plan approximately 45% of the time. Vanessa [name] states she is doing 0 minutes 0 times per week.  Today's visit was #: 7 Starting weight: 178 lbs Starting date: 08/13/2020 Today's weight: 170 lbs Today's date: 01/07/2021 Total lbs lost to date: 8 Total lbs lost since last in-office visit: 0  Interim History: Vanessa [name] has had multiple stressors which has mad her  deviate from her plan more. She still tried to decrease simple carbohydrates and avoid desserts which has helped her maintain her weight.  Subjective:   1. Vitamin D deficiency Vanessa [name]'s last Vit D level was below goal.  2. Elevated blood pressure reading Vanessa [name]'s blood pressure is elevated today, and she has no history of hypertension. She is stressed and upset about her son's health. She has a history of anxiety.  3. At risk for hypertension Vanessa [name] is at a higher than average risk of hypertension due to elevated blood pressure.  Assessment/Plan:   1. Vitamin D deficiency Low Vitamin D level contributes to fatigue and are associated with obesity, breast, and colon cancer. We will refill prescription Vitamin D for 1 month. Vanessa [name] will follow-up for routine testing of Vitamin D, at least 2-3 times per year to avoid over-replacement.  - Vitamin D, Ergocalciferol, (DRISDOL) 1.25 MG (50000 UNIT) CAPS capsule; Take 1 capsule (50,000 Units total) by mouth every 7 (seven) days.  Dispense: 4 capsule; Refill: 0  2. Elevated blood pressure reading [name] will continue with diet and exercise to improve blood pressure control. We will recheck her blood pressure in 1 month as she continues  her lifestyle modifications.  3. At risk for hypertension Vanessa [name] was given approximately 15 minutes of hypertension prevention counseling today. Vanessa [name] is at risk for hypertension due to obesity. We discussed intensive lifestyle modifications today with an emphasis on weight loss as well as increasing exercise and decreasing salt intake.  Repetitive spaced learning was employed today to elicit superior memory formation and behavioral change.  4. Obesity with current BMI 31.2 Vanessa [name] is currently in the action stage of change. As such, her goal is to continue with weight loss efforts. She has agreed to the Category 2 Plan and keeping a food journal and adhering to recommended goals of 350-550 calories and 40+ grams of protein at supper daily.   We will recheck fasting labs at her next visit.  Behavioral modification strategies: increasing lean protein intake, decreasing simple carbohydrates, and meal planning and cooking strategies.  Vanessa [name] has agreed to follow-up with our clinic in 4 weeks. She was informed of the importance of frequent follow-up visits to maximize her success with intensive lifestyle modifications for her multiple health conditions.   Objective:   Blood pressure (!) 144/84, pulse 76, temperature 98.2 F (36.8 C), height 5\' 2"  (1.575 m), weight 170 lb (77.1 kg), last menstrual period 03/08/2006, SpO2 99 %. Body mass index is 31.09 kg/m.  General: Cooperative, alert, well developed, in no acute distress. HEENT: Conjunctivae and lids unremarkable. Cardiovascular: Regular rhythm.  Lungs: Normal work of breathing. Neurologic: No focal deficits.   Lab Results  Component Value Date   CREATININE 0.93 08/13/2020  BUN 17 08/13/2020   NA 141 08/13/2020   K 4.6 08/13/2020   CL 102 08/13/2020   CO2 20 08/13/2020   Lab Results  Component Value Date   ALT 13 08/13/2020   AST 13 08/13/2020   ALKPHOS 74 08/13/2020   BILITOT 0.4 08/13/2020   Lab Results   Component Value Date   HGBA1C 5.8 (H) 08/13/2020   HGBA1C 5.7 07/21/2015   HGBA1C 5.7 01/22/2013   Lab Results  Component Value Date   INSULIN 18.0 08/13/2020   Lab Results  Component Value Date   TSH 2.640 08/13/2020   Lab Results  Component Value Date   CHOL 285 (H) 08/13/2020   HDL 42 08/13/2020   LDLCALC 216 (H) 08/13/2020   LDLDIRECT 199.0 10/15/2018   TRIG 143 08/13/2020   CHOLHDL 7.7 (H) 10/21/2019   Lab Results  Component Value Date   VD25OH 22.0 (L) 08/13/2020   VD25OH 35 04/10/2020   VD25OH 46 10/21/2019   Lab Results  Component Value Date   WBC 12.4 (H) 08/13/2020   HGB 14.7 08/13/2020   HCT 45.0 08/13/2020   MCV 87 08/13/2020   PLT 343.0 03/18/2020   No results found for: IRON, TIBC, FERRITIN  Attestation Statements:   Reviewed by clinician on day of visit: allergies, medications, problem list, medical history, surgical history, family history, social history, and previous encounter notes.   I, Trixie Dredge, am acting as transcriptionist for Dennard Nip, MD.  I have reviewed the above documentation for accuracy and completeness, and I agree with the above. -  Dennard Nip, MD

## 2021-02-01 ENCOUNTER — Ambulatory Visit (INDEPENDENT_AMBULATORY_CARE_PROVIDER_SITE_OTHER): Payer: BC Managed Care – PPO

## 2021-02-01 ENCOUNTER — Other Ambulatory Visit: Payer: Self-pay

## 2021-02-01 DIAGNOSIS — E538 Deficiency of other specified B group vitamins: Secondary | ICD-10-CM | POA: Diagnosis not present

## 2021-02-01 MED ORDER — CYANOCOBALAMIN 1000 MCG/ML IJ SOLN
1000.0000 ug | Freq: Once | INTRAMUSCULAR | Status: AC
  Administered 2021-02-01: 1000 ug via INTRAMUSCULAR

## 2021-02-01 NOTE — Progress Notes (Addendum)
Patient here for monthly B12 injection per Dr. Alain Marion. B12 1000 mcg given in Right IM and patient tolerated injection well today.   Medical screening examination/treatment/procedure(s) were performed by non-physician practitioner and as supervising physician I was immediately available for consultation/collaboration.  I agree with above. Lew Dawes, MD

## 2021-02-04 ENCOUNTER — Encounter (INDEPENDENT_AMBULATORY_CARE_PROVIDER_SITE_OTHER): Payer: Self-pay | Admitting: Family Medicine

## 2021-02-04 ENCOUNTER — Other Ambulatory Visit: Payer: Self-pay

## 2021-02-04 ENCOUNTER — Ambulatory Visit (INDEPENDENT_AMBULATORY_CARE_PROVIDER_SITE_OTHER): Payer: BC Managed Care – PPO | Admitting: Family Medicine

## 2021-02-04 VITALS — BP 120/64 | HR 67 | Temp 97.9°F | Ht 62.0 in | Wt 168.0 lb

## 2021-02-04 DIAGNOSIS — E039 Hypothyroidism, unspecified: Secondary | ICD-10-CM

## 2021-02-04 DIAGNOSIS — R030 Elevated blood-pressure reading, without diagnosis of hypertension: Secondary | ICD-10-CM | POA: Diagnosis not present

## 2021-02-04 DIAGNOSIS — E559 Vitamin D deficiency, unspecified: Secondary | ICD-10-CM | POA: Diagnosis not present

## 2021-02-04 DIAGNOSIS — Z9189 Other specified personal risk factors, not elsewhere classified: Secondary | ICD-10-CM | POA: Diagnosis not present

## 2021-02-04 DIAGNOSIS — E7849 Other hyperlipidemia: Secondary | ICD-10-CM | POA: Diagnosis not present

## 2021-02-04 DIAGNOSIS — R7303 Prediabetes: Secondary | ICD-10-CM | POA: Diagnosis not present

## 2021-02-04 DIAGNOSIS — E669 Obesity, unspecified: Secondary | ICD-10-CM

## 2021-02-04 DIAGNOSIS — Z6832 Body mass index (BMI) 32.0-32.9, adult: Secondary | ICD-10-CM

## 2021-02-04 MED ORDER — VITAMIN D (ERGOCALCIFEROL) 1.25 MG (50000 UNIT) PO CAPS: 50000.0000 [IU] | ORAL_CAPSULE | ORAL | 0 refills | Status: DC

## 2021-02-04 NOTE — Progress Notes (Signed)
Chief Complaint:   OBESITY Vanessa [name] is here to discuss her progress with her obesity treatment plan along with follow-up of her obesity related diagnoses. Jonell is on the Category 2 Plan and keeping a food journal and adhering to recommended goals of 350-550 calories and 40+ grams of protein at supper daily and states she is following her eating plan approximately 85% of the time. Shamone states she is doing 0 minutes 0 times per week.  Today's visit was #: 8 Starting weight: 178 lbs Starting date: 08/13/2020 Today's weight: 168 lbs Today's date: 02/04/2021 Total lbs lost to date: 10 Total lbs lost since last in-office visit: 2  Interim History: Lowen continues to do well with weight loss on her plan. Her hunger is controlled and she id well with portion control over Halloween.  Subjective:   1. Pre-hypertension Arlissa's blood pressure is at goal today after being elevated previously. She is not on any anti-hypertensives currently, and she is working on diet and exercise.  2. Vitamin D deficiency Amery is on Vit D prescription, and she is due to have her labs checked.  3. Other hyperlipidemia Yalexa's last LDL was elevated, and she is not on statin. She is working on diet and exercise.  4. Pre-diabetes Tully is working on diet and exercise, and her last A1c and insulin were reviewed. She is due to have labs rechecked.  5. Hypothyroidism, unspecified type Sora is on levothyroxine, and no palpitation or tremors were noted. She is working on weight loss, and this may cause her levels to go out of range.  6. At risk for heart disease Dawnetta is at a higher than average risk for cardiovascular disease due to obesity.   Assessment/Plan:   1. Pre-hypertension Montie will continue with her diet and exercise, and will continue to monitor as she continues her lifestyle modifications.  2. Vitamin D deficiency Low Vitamin D level contributes to fatigue and are  associated with obesity, breast, and colon cancer. We will refill prescription Vitamin D for 1 month. We will check labs today, and Itzayanna will follow-up for routine testing of Vitamin D, at least 2-3 times per year to avoid over-replacement.  - Vitamin D, Ergocalciferol, (DRISDOL) 1.25 MG (50000 UNIT) CAPS capsule; Take 1 capsule (50,000 Units total) by mouth every 7 (seven) days.  Dispense: 4 capsule; Refill: 0 - VITAMIN D 25 Hydroxy (Vit-D Deficiency, Fractures)  3. Other hyperlipidemia Cardiovascular risk and specific lipid/LDL goals reviewed. We discussed several lifestyle modifications today. We will check labs today. Conor will continue to work on diet, exercise and weight loss efforts. Orders and follow up as documented in patient record.   - Lipid Panel With LDL/HDL Ratio  4. Pre-diabetes Becca will continue to work on weight loss, exercise, and decreasing simple carbohydrates to help decrease the risk of diabetes. We will check labs today.  - CMP14+EGFR - Insulin, random - Hemoglobin A1c  5. Hypothyroidism, unspecified type We will check labs today, and will follow up at Lelar's next visit. Orders and follow up as documented in patient record.  - TSH  6. At risk for heart disease Eleanna was given approximately 15 minutes of coronary artery disease prevention counseling today. She is 60 y.o. female and has risk factors for heart disease including obesity. We discussed intensive lifestyle modifications today with an emphasis on specific weight loss instructions and strategies.   Repetitive spaced learning was employed today to elicit superior memory formation and behavioral change.  7. Obesity  with current BMI 30.9 Harleen is currently in the action stage of change. As such, her goal is to continue with weight loss efforts. She has agreed to the Category 2 Plan.   Behavioral modification strategies: increasing lean protein intake and holiday eating strategies  .  Aubree has agreed to follow-up with our clinic in 3 weeks. She was informed of the importance of frequent follow-up visits to maximize her success with intensive lifestyle modifications for her multiple health conditions.   Zafiro was informed we would discuss her lab results at her next visit unless there is a critical issue that needs to be addressed sooner. Artrice agreed to keep her next visit at the agreed upon time to discuss these results.  Objective:   Blood pressure 120/64, pulse 67, temperature 97.9 F (36.6 C), height '5\' 2"'  (1.575 m), weight 168 lb (76.2 kg), last menstrual period 03/08/2006, SpO2 98 %. Body mass index is 30.73 kg/m.  General: Cooperative, alert, well developed, in no acute distress. HEENT: Conjunctivae and lids unremarkable. Cardiovascular: Regular rhythm.  Lungs: Normal work of breathing. Neurologic: No focal deficits.   Lab Results  Component Value Date   CREATININE 0.93 08/13/2020   BUN 17 08/13/2020   NA 141 08/13/2020   K 4.6 08/13/2020   CL 102 08/13/2020   CO2 20 08/13/2020   Lab Results  Component Value Date   ALT 13 08/13/2020   AST 13 08/13/2020   ALKPHOS 74 08/13/2020   BILITOT 0.4 08/13/2020   Lab Results  Component Value Date   HGBA1C 5.8 (H) 08/13/2020   HGBA1C 5.7 07/21/2015   HGBA1C 5.7 01/22/2013   Lab Results  Component Value Date   INSULIN 18.0 08/13/2020   Lab Results  Component Value Date   TSH 2.640 08/13/2020   Lab Results  Component Value Date   CHOL 285 (H) 08/13/2020   HDL 42 08/13/2020   LDLCALC 216 (H) 08/13/2020   LDLDIRECT 199.0 10/15/2018   TRIG 143 08/13/2020   CHOLHDL 7.7 (H) 10/21/2019   Lab Results  Component Value Date   VD25OH 22.0 (L) 08/13/2020   VD25OH 35 04/10/2020   VD25OH 46 10/21/2019   Lab Results  Component Value Date   WBC 12.4 (H) 08/13/2020   HGB 14.7 08/13/2020   HCT 45.0 08/13/2020   MCV 87 08/13/2020   PLT 343.0 03/18/2020   No results found for: IRON, TIBC,  FERRITIN  Attestation Statements:   Reviewed by clinician on day of visit: allergies, medications, problem list, medical history, surgical history, family history, social history, and previous encounter notes.   I, Trixie Dredge, am acting as transcriptionist for Dennard Nip, MD.  I have reviewed the above documentation for accuracy and completeness, and I agree with the above. -  Dennard Nip, MD

## 2021-02-05 ENCOUNTER — Telehealth: Payer: Self-pay | Admitting: Internal Medicine

## 2021-02-05 LAB — CMP14+EGFR
ALT: 15 IU/L (ref 0–32)
AST: 18 IU/L (ref 0–40)
Albumin/Globulin Ratio: 1.4 (ref 1.2–2.2)
Albumin: 4.6 g/dL (ref 3.8–4.9)
Alkaline Phosphatase: 68 IU/L (ref 44–121)
BUN/Creatinine Ratio: 23 (ref 12–28)
BUN: 23 mg/dL (ref 8–27)
Bilirubin Total: 0.3 mg/dL (ref 0.0–1.2)
CO2: 19 mmol/L — ABNORMAL LOW (ref 20–29)
Calcium: 9.9 mg/dL (ref 8.7–10.3)
Chloride: 101 mmol/L (ref 96–106)
Creatinine, Ser: 1 mg/dL (ref 0.57–1.00)
Globulin, Total: 3.3 g/dL (ref 1.5–4.5)
Glucose: 87 mg/dL (ref 70–99)
Potassium: 4.5 mmol/L (ref 3.5–5.2)
Sodium: 139 mmol/L (ref 134–144)
Total Protein: 7.9 g/dL (ref 6.0–8.5)
eGFR: 64 mL/min/{1.73_m2} (ref >59)

## 2021-02-05 LAB — LIPID PANEL WITH LDL/HDL RATIO
Cholesterol, Total: 302 mg/dL — ABNORMAL HIGH (ref 100–199)
HDL: 47 mg/dL (ref >39)
LDL Chol Calc (NIH): 232 mg/dL — ABNORMAL HIGH (ref 0–99)
LDL/HDL Ratio: 4.9 ratio — ABNORMAL HIGH (ref 0.0–3.2)
Triglycerides: 124 mg/dL (ref 0–149)
VLDL Cholesterol Cal: 23 mg/dL (ref 5–40)

## 2021-02-05 LAB — HEMOGLOBIN A1C
Est. average glucose Bld gHb Est-mCnc: 117 mg/dL
Hgb A1c MFr Bld: 5.7 % — ABNORMAL HIGH (ref 4.8–5.6)

## 2021-02-05 LAB — TSH: TSH: 0.923 u[IU]/mL (ref 0.450–4.500)

## 2021-02-05 LAB — INSULIN, RANDOM: INSULIN: 16.1 u[IU]/mL (ref 2.6–24.9)

## 2021-02-05 LAB — VITAMIN D 25 HYDROXY (VIT D DEFICIENCY, FRACTURES): Vit D, 25-Hydroxy: 62.8 ng/mL (ref 30.0–100.0)

## 2021-02-05 NOTE — Telephone Encounter (Signed)
Okay.  Thanks.

## 2021-02-05 NOTE — Telephone Encounter (Signed)
Patient states provider agreed to take her son Vanessa [name] mrn [phone] as a new patient  Advised Patient I would have to get the approval of the provider  Patient requesting a call back

## 2021-03-02 ENCOUNTER — Ambulatory Visit (INDEPENDENT_AMBULATORY_CARE_PROVIDER_SITE_OTHER): Payer: BC Managed Care – PPO | Admitting: Family Medicine

## 2021-03-03 ENCOUNTER — Ambulatory Visit (INDEPENDENT_AMBULATORY_CARE_PROVIDER_SITE_OTHER): Payer: BC Managed Care – PPO

## 2021-03-03 ENCOUNTER — Other Ambulatory Visit: Payer: Self-pay | Admitting: Internal Medicine

## 2021-03-03 ENCOUNTER — Other Ambulatory Visit: Payer: Self-pay

## 2021-03-03 DIAGNOSIS — E538 Deficiency of other specified B group vitamins: Secondary | ICD-10-CM | POA: Diagnosis not present

## 2021-03-03 MED ORDER — CYANOCOBALAMIN 1000 MCG/ML IJ SOLN
1000.0000 ug | Freq: Once | INTRAMUSCULAR | Status: AC
  Administered 2021-03-03: 1000 ug via INTRAMUSCULAR

## 2021-03-08 ENCOUNTER — Other Ambulatory Visit: Payer: Self-pay | Admitting: Internal Medicine

## 2021-03-10 ENCOUNTER — Ambulatory Visit (INDEPENDENT_AMBULATORY_CARE_PROVIDER_SITE_OTHER): Payer: BC Managed Care – PPO | Admitting: Family Medicine

## 2021-03-12 NOTE — Telephone Encounter (Signed)
Patient calling in to check status of refill request  Advised patient of 3BD turnaround time

## 2021-03-18 LAB — HM MAMMOGRAPHY

## 2021-03-24 ENCOUNTER — Encounter: Payer: Self-pay | Admitting: Obstetrics & Gynecology

## 2021-04-05 ENCOUNTER — Other Ambulatory Visit: Payer: Self-pay

## 2021-04-05 ENCOUNTER — Ambulatory Visit (INDEPENDENT_AMBULATORY_CARE_PROVIDER_SITE_OTHER): Payer: BC Managed Care – PPO

## 2021-04-05 DIAGNOSIS — E538 Deficiency of other specified B group vitamins: Secondary | ICD-10-CM

## 2021-04-05 MED ORDER — CYANOCOBALAMIN 1000 MCG/ML IJ SOLN
1000.0000 ug | Freq: Once | INTRAMUSCULAR | Status: AC
  Administered 2021-04-05: 1000 ug via INTRAMUSCULAR

## 2021-04-05 NOTE — Progress Notes (Addendum)
Pt was given B12 w/o any complications. Medical screening examination/treatment/procedure(s) were performed by non-physician practitioner and as supervising physician I was immediately available for consultation/collaboration.  I agree with above. Aleksei Plotnikov, MD 

## 2021-04-07 ENCOUNTER — Encounter: Payer: Self-pay | Admitting: Internal Medicine

## 2021-04-08 NOTE — Progress Notes (Addendum)
Patient administered B12 injection in left arm and tolerated injection well  Medical screening examination/treatment/procedure(s) were performed by non-physician practitioner and as supervising physician I was immediately available for consultation/collaboration.  I agree with above. Lew Dawes, MD

## 2021-04-21 ENCOUNTER — Ambulatory Visit (INDEPENDENT_AMBULATORY_CARE_PROVIDER_SITE_OTHER): Payer: BC Managed Care – PPO | Admitting: Obstetrics & Gynecology

## 2021-04-21 ENCOUNTER — Other Ambulatory Visit: Payer: Self-pay

## 2021-04-21 ENCOUNTER — Encounter: Payer: Self-pay | Admitting: Obstetrics & Gynecology

## 2021-04-21 VITALS — BP 124/80 | HR 78 | Resp 16 | Ht 62.25 in | Wt 176.0 lb

## 2021-04-21 DIAGNOSIS — M8588 Other specified disorders of bone density and structure, other site: Secondary | ICD-10-CM | POA: Diagnosis not present

## 2021-04-21 DIAGNOSIS — Z780 Asymptomatic menopausal state: Secondary | ICD-10-CM

## 2021-04-21 DIAGNOSIS — Z01419 Encounter for gynecological examination (general) (routine) without abnormal findings: Secondary | ICD-10-CM

## 2021-04-21 NOTE — Progress Notes (Signed)
Vanessa [name] DSKAJG Nov 01, 1960 [id number]   History:    61 y.o. G2P2L2  RP:  Established patient presenting for annual gyn exam   HPI: Postmenopausal, well on no HRT.  No significant hot flashes or night sweats. No vaginal bleeding.  No pelvic pain.  Pap Neg 03/2020.  Previous Paps all normal.  Breasts normal.  Mammo Neg 02/2021. Colonoscopy 2014, repeat at 10 yrs.  BD 08/2020 Osteopenia T-Score -1.4, Frax normal.  Vit D normal at 62 in 01/2021, continue 2000 IU of Vit D daily.  Increase fitness activities.  BMI 31.93.  Health labs with Fam MD.  Past medical history,surgical history, family history and social history were all reviewed and documented in the EPIC chart.  Gynecologic History Patient's last menstrual period was 03/08/2006.  Obstetric History OB History  Gravida Para Term Preterm AB Living  2 2       2   SAB IAB Ectopic Multiple Live Births               # Outcome Date GA Lbr Len/2nd Weight Sex Delivery Anes PTL Lv  2 Para           1 Para              ROS: A ROS was performed and pertinent positives and negatives are included in the history.  GENERAL: No fevers or chills. HEENT: No change in vision, no earache, sore throat or sinus congestion. NECK: No pain or stiffness. CARDIOVASCULAR: No chest pain or pressure. No palpitations. PULMONARY: No shortness of breath, cough or wheeze. GASTROINTESTINAL: No abdominal pain, nausea, vomiting or diarrhea, melena or bright red blood per rectum. GENITOURINARY: No urinary frequency, urgency, hesitancy or dysuria. MUSCULOSKELETAL: No joint or muscle pain, no back pain, no recent trauma. DERMATOLOGIC: No rash, no itching, no lesions. ENDOCRINE: No polyuria, polydipsia, no heat or cold intolerance. No recent change in weight. HEMATOLOGICAL: No anemia or easy bruising or bleeding. NEUROLOGIC: No headache, seizures, numbness, tingling or weakness. PSYCHIATRIC: No depression, no loss of interest in normal activity or change in sleep pattern.      Exam:   BP 124/80    Pulse 78    Resp 16    Ht 5' 2.25" (1.581 m)    Wt 176 lb (79.8 kg)    LMP 03/08/2006    BMI 31.93 kg/m   Body mass index is 31.93 kg/m.  General appearance : Well developed well nourished female. No acute distress HEENT: Eyes: no retinal hemorrhage or exudates,  Neck supple, trachea midline, no carotid bruits, no thyroidmegaly Lungs: Clear to auscultation, no rhonchi or wheezes, or rib retractions  Heart: Regular rate and rhythm, no murmurs or gallops Breast:Examined in sitting and supine position were symmetrical in appearance, no palpable masses or tenderness,  no skin retraction, no nipple inversion, no nipple discharge, no skin discoloration, no axillary or supraclavicular lymphadenopathy Abdomen: no palpable masses or tenderness, no rebound or guarding Extremities: no edema or skin discoloration or tenderness  Pelvic: Vulva: Normal             Vagina: No gross lesions or discharge  Cervix: No gross lesions or discharge  Uterus  AV, normal size, shape and consistency, non-tender and mobile  Adnexa  Without masses or tenderness  Anus: Normal   Assessment/Plan:  61 y.o. female for annual exam   1. Well female exam with routine gynecological exam Postmenopausal, well on no HRT.  No significant hot flashes or night sweats. No  vaginal bleeding.  No pelvic pain.  Pap Neg 03/2020.  Previous Paps all normal.  Breasts normal.  Mammo Neg 02/2021. Colonoscopy 2014, repeat at 10 yrs.  BD 08/2020 Osteopenia T-Score -1.4, Frax normal.  Vit D normal at 62 in 01/2021, continue 2000 IU of Vit D daily.  Increase fitness activities.  BMI 31.93.  Health labs with Fam MD.  2. Postmenopause Postmenopausal, well on no HRT.  No significant hot flashes or night sweats. No vaginal bleeding.  No pelvic pain.   3. Osteopenia of lumbar spine BD 08/2020 Osteopenia T-Score -1.4, Frax normal.  Vit D normal at 62 in 01/2021, continue 2000 IU of Vit D daily.  Increase fitness  activities.  Other orders - Ciclopirox 0.77 % gel; Apply topically 2 (two) times daily. - terbinafine (LAMISIL) 250 MG tablet; Take 250 mg by mouth daily. - VITAMIN D PO; Take by mouth.   Princess Bruins MD, 8:41 AM 04/21/2021

## 2021-05-01 ENCOUNTER — Ambulatory Visit: Payer: Self-pay

## 2021-05-07 ENCOUNTER — Ambulatory Visit: Payer: BC Managed Care – PPO

## 2021-05-10 ENCOUNTER — Ambulatory Visit (INDEPENDENT_AMBULATORY_CARE_PROVIDER_SITE_OTHER): Payer: BC Managed Care – PPO

## 2021-05-10 ENCOUNTER — Other Ambulatory Visit: Payer: Self-pay

## 2021-05-10 DIAGNOSIS — E538 Deficiency of other specified B group vitamins: Secondary | ICD-10-CM

## 2021-05-10 NOTE — Progress Notes (Addendum)
Pt here for monthly B12 injection per Dr.Plotnikov  B12 1024mcg given IM, and pt tolerated injection well.  Next B12 injection scheduled for 06/09/21.  Medical screening examination/treatment/procedure(s) were performed by non-physician practitioner and as supervising physician I was immediately available for consultation/collaboration.  I agree with above. Lew Dawes, MD

## 2021-06-09 ENCOUNTER — Other Ambulatory Visit: Payer: Self-pay

## 2021-06-09 ENCOUNTER — Ambulatory Visit (INDEPENDENT_AMBULATORY_CARE_PROVIDER_SITE_OTHER): Payer: BC Managed Care – PPO

## 2021-06-09 DIAGNOSIS — E538 Deficiency of other specified B group vitamins: Secondary | ICD-10-CM

## 2021-06-09 NOTE — Progress Notes (Addendum)
Pt here for monthly B12 injection per Dr. Alain Marion ? ?B12 1059mg given IM, and pt tolerated injection well. ? ?Next B12 injection scheduled for 07/12/21 ? ?Medical screening examination/treatment/procedure(s) were performed by non-physician practitioner and as supervising physician I was immediately available for consultation/collaboration.  I agree with above. ALew Dawes MD ?

## 2021-07-01 ENCOUNTER — Ambulatory Visit: Payer: Self-pay

## 2021-07-02 ENCOUNTER — Other Ambulatory Visit: Payer: Self-pay | Admitting: Internal Medicine

## 2021-07-02 ENCOUNTER — Other Ambulatory Visit: Payer: Self-pay

## 2021-07-02 ENCOUNTER — Ambulatory Visit: Payer: Self-pay

## 2021-07-02 ENCOUNTER — Ambulatory Visit
Admission: EM | Admit: 2021-07-02 | Discharge: 2021-07-02 | Disposition: A | Discharge status: Home / Self Care | Payer: BC Managed Care – PPO | Attending: Student | Admitting: Student

## 2021-07-02 DIAGNOSIS — J0100 Acute maxillary sinusitis, unspecified: Secondary | ICD-10-CM

## 2021-07-02 MED ORDER — OLOPATADINE HCL 0.1 % OP SOLN: 1.0000 [drp] | Freq: Two times a day (BID) | OPHTHALMIC | 2 refills | Status: DC

## 2021-07-02 MED ORDER — AMOXICILLIN 875 MG PO TABS: 875.0000 mg | ORAL_TABLET | Freq: Two times a day (BID) | ORAL | 0 refills | Status: AC

## 2021-07-02 MED ORDER — MONTELUKAST SODIUM 10 MG PO TABS: 10.0000 mg | ORAL_TABLET | Freq: Every day | ORAL | 2 refills | Status: DC

## 2021-07-02 NOTE — Discharge Instructions (Addendum)
-  Amoxicillin twice daily x7 days. Take with food if sensitive stomach ?-Montelukast once daily for allergies ?-Olopatadine up to twice daily for allergic conjunctivitis ?

## 2021-07-02 NOTE — ED Provider Notes (Signed)
?[facility] ? ? ? ?CSN: [id number] ?Arrival date & time: 07/02/21  0805 ? ? ?  ? ?History   ?Chief Complaint ?Chief Complaint  ?Patient presents with  ? Nasal Congestion  ? ? ?HPI ?Vanessa [name] is a 61 y.o. female presenting with nasal congestion and watering eyes for about 1 week.  Describes progressively worsening nasal congestion, postnasal drip, watery eyes.  Occasional nonproductive cough that she attributes to the postnasal drip.  Has attempted various over-the-counter medications including Zyrtec and Mucinex without relief.  States that the pain in her sinuses radiates to her teeth on the left side. ? ?HPI ? ?Past Medical History:  ?Diagnosis Date  ? Allergy   ? rhinitis  ? Anxiety   ? B12 deficiency   ? Cyst   ? gangeous- left hand  ? Depression   ? Hyperlipidemia   ? Hypothyroidism   ? Hypothyroidism   ? Kidney problem   ? Tobacco abuse   ? Vitamin D deficiency 11/2017  ? Value 21  ? ? ?Patient Active Problem List  ? Diagnosis Date Noted  ? Class 1 obesity with serious comorbidity and body mass index (BMI) of 32.0 to 32.9 in adult 10/28/2020  ? GERD (gastroesophageal reflux disease) 10/21/2020  ? Proteinuria 05/19/2020  ? Renal insufficiency 05/19/2020  ? Bowel habit changes 03/18/2020  ? Hand eczema 02/17/2020  ? Plantar fasciitis, right 12/07/2018  ? Flank pain 02/02/2018  ? Acute pain of right shoulder 12/17/2017  ? Strep throat 09/17/2016  ? Tick bite of back 09/17/2016  ? Menopause 03/25/2016  ? Hypertension, renal disease, stage 1-4 or unspecified chronic kidney disease 07/21/2015  ? Vitamin D deficiency 07/21/2015  ? Cough 06/28/2015  ? Trigger thumb of left hand 11/20/2014  ? Trigger thumb 04/23/2014  ? Rash and nonspecific skin eruption 04/23/2014  ? Abscess of skin of abdomen 04/09/2014  ? Onychomycosis 04/09/2014  ? Pain of right thumb 04/04/2014  ? Smoker 06/26/2013  ? Eye pain 11/09/2012  ? Right-sided chest wall pain 09/11/2012  ? Well adult exam 12/28/2010  ? PSORIASIS  02/02/2010  ? SINUSITIS, ACUTE 08/28/2009  ? B12 deficiency 02/10/2009  ? Anxiety disorder 08/11/2008  ? DEPRESSION 08/11/2008  ? Acute upper respiratory infection 06/03/2008  ? INSOMNIA, PERSISTENT 02/14/2008  ? PHARYNGITIS 02/14/2008  ? FATIGUE 02/14/2008  ? SWEATING 02/14/2008  ? TOBACCO USE DISORDER/SMOKER-SMOKING CESSATION DISCUSSED 06/08/2007  ? Hypothyroidism 03/07/2007  ? Allergic rhinitis 03/07/2007  ? WEIGHT GAIN 03/07/2007  ? ABNORMAL GLUCOSE NEC 03/07/2007  ? Dyslipidemia 10/27/2006  ? ? ?Past Surgical History:  ?Procedure Laterality Date  ? [place]  ? EYE SURGERY    ? GANGLION CYST EXCISION  2009  ? right ankle  ? ? ?OB History   ? ? Gravida  ?2  ? Para  ?2  ? Term  ?   ? Preterm  ?   ? AB  ?   ? Living  ?2  ?  ? ? SAB  ?   ? IAB  ?   ? Ectopic  ?   ? Multiple  ?   ? Live Births  ?   ?   ?  ?  ? ? ? ?Home Medications   ? ?Prior to Admission medications   ?Medication Sig Start Date End Date Taking? Authorizing Provider  ?amoxicillin (AMOXIL) 875 MG tablet Take 1 tablet (875 mg total) by mouth 2 (two) times daily for 7 days. 07/02/21 07/09/21 Yes Marin Roberts  E, PA-C  ?montelukast (SINGULAIR) 10 MG tablet Take 1 tablet (10 mg total) by mouth at bedtime. 07/02/21  Yes Hazel Sams, PA-C  ?olopatadine (PATANOL) 0.1 % ophthalmic solution Place 1 drop into both eyes 2 (two) times daily. 07/02/21  Yes Hazel Sams, PA-C  ?ALPRAZolam (XANAX) 0.25 MG tablet Take 1 tablet by mouth twice daily as needed 03/14/21   Plotnikov, Evie Lacks, MD  ?levothyroxine (SYNTHROID) 88 MCG tablet Take 1 tablet by mouth once daily 03/03/21   Plotnikov, Evie Lacks, MD  ?VITAMIN D PO Take by mouth.    [provider]  ? ? ?Family History ?Family History  ?Problem Relation Age of Onset  ? Thyroid disease Mother   ?     Low  ? Other Mother   ?     B12 deficiency  ? Diabetes Mother   ? Hyperlipidemia Mother   ? Heart disease Father 56  ?     CAD  ? Hyperlipidemia Father   ? Heart failure Father   ? Sudden death  Father   ? Coronary artery disease Other   ? Hyperlipidemia Other   ? Psoriasis Son   ? Colon cancer Neg Hx   ? ? ?Social History ?Social History  ? ?Tobacco Use  ? Smoking status: Every Day  ?  Types: Cigarettes  ? Smokeless tobacco: Never  ?Vaping Use  ? Vaping Use: Never used  ?Substance Use Topics  ? Alcohol use: Not Currently  ?  Alcohol/week: 0.0 standard drinks  ? Drug use: No  ? ? ? ?Allergies   ?Atorvastatin, Crestor [rosuvastatin calcium], Ezetimibe-simvastatin, Hctz [hydrochlorothiazide], and Hydrocodone bit-homatrop mbr ? ? ?Review of Systems ?Review of Systems  ?Constitutional:  Negative for appetite change, chills and fever.  ?HENT:  Positive for congestion and postnasal drip. Negative for ear pain, rhinorrhea, sinus pressure, sinus pain and sore throat.   ?Eyes:  Negative for redness and visual disturbance.  ?Respiratory:  Positive for cough. Negative for chest tightness, shortness of breath and wheezing.   ?Cardiovascular:  Negative for chest pain and palpitations.  ?Gastrointestinal:  Negative for abdominal pain, constipation, diarrhea, nausea and vomiting.  ?Genitourinary:  Negative for dysuria, frequency and urgency.  ?Musculoskeletal:  Negative for myalgias.  ?Neurological:  Negative for dizziness, weakness and headaches.  ?Psychiatric/Behavioral:  Negative for confusion.   ?All other systems reviewed and are negative. ? ? ?Physical Exam ?Triage Vital Signs ?ED Triage Vitals  ?Enc Vitals Group  ?   BP 07/02/21 0821 136/90  ?   Pulse Rate 07/02/21 0821 86  ?   Resp 07/02/21 0821 18  ?   Temp 07/02/21 0821 98.2 ?F (36.8 ?C)  ?   Temp Source 07/02/21 0821 Oral  ?   SpO2 07/02/21 0821 95 %  ?   Weight --   ?   Height --   ?   Head Circumference --   ?   Peak Flow --   ?   Pain Score 07/02/21 0822 0  ?   Pain Loc --   ?   Pain Edu? --   ?   Excl. in [place]? --   ? ?No data found. ? ?Updated Vital Signs ?BP 136/90 (BP Location: Left Arm)   Pulse 86   Temp 98.2 ?F (36.8 ?C) (Oral)   Resp 18   LMP  03/08/2006   SpO2 95%  ? ?Visual Acuity ?Right Eye Distance:   ?Left Eye Distance:   ?Bilateral Distance:   ? ?  Right Eye Near:   ?Left Eye Near:    ?Bilateral Near:    ? ?Physical Exam ?Vitals reviewed.  ?Constitutional:   ?   General: She is not in acute distress. ?   Appearance: Normal appearance. She is not ill-appearing.  ?HENT:  ?   Head: Normocephalic and atraumatic.  ?   Right Ear: Tympanic membrane, ear canal and external ear normal. No tenderness. No middle ear effusion. There is no impacted cerumen. Tympanic membrane is not perforated, erythematous, retracted or bulging.  ?   Left Ear: Tympanic membrane, ear canal and external ear normal. No tenderness.  No middle ear effusion. There is no impacted cerumen. Tympanic membrane is not perforated, erythematous, retracted or bulging.  ?   Nose: Nose normal. No congestion.  ?   Mouth/Throat:  ?   Mouth: Mucous membranes are moist.  ?   Pharynx: Uvula midline. Posterior oropharyngeal erythema present. No oropharyngeal exudate.  ?   Comments: Cobblestoning posterior pharynx  ?Eyes:  ?   General: Allergic shiner present.  ?   Extraocular Movements: Extraocular movements intact.  ?   Pupils: Pupils are equal, round, and reactive to light.  ?Cardiovascular:  ?   Rate and Rhythm: Normal rate and regular rhythm.  ?   Heart sounds: Normal heart sounds.  ?Pulmonary:  ?   Effort: Pulmonary effort is normal.  ?   Breath sounds: Normal breath sounds. No decreased breath sounds, wheezing, rhonchi or rales.  ?Abdominal:  ?   Palpations: Abdomen is soft.  ?   Tenderness: There is no abdominal tenderness. There is no guarding or rebound.  ?Lymphadenopathy:  ?   Cervical: No cervical adenopathy.  ?   Right cervical: No superficial cervical adenopathy. ?   Left cervical: No superficial cervical adenopathy.  ?Neurological:  ?   General: No focal deficit present.  ?   Mental Status: She is alert and oriented to person, place, and time.  ?Psychiatric:     ?   Mood and Affect: Mood  normal.     ?   Behavior: Behavior normal.     ?   Thought Content: Thought content normal.     ?   Judgment: Judgment normal.  ? ? ? ?UC Treatments / Results  ?Labs ?(all labs ordered are listed, but only abnormal results

## 2021-07-02 NOTE — ED Triage Notes (Signed)
Pt c/o watery puffy eyes, runny nose, ear pressure, sore throat, slight cough ? ?Onset ~ 1 week ago ?

## 2021-07-05 ENCOUNTER — Encounter: Payer: Self-pay | Admitting: Internal Medicine

## 2021-07-05 NOTE — Telephone Encounter (Signed)
1.Medication Requested: ?ALPRAZolam (XANAX) 0.25 MG tablet ?2. Pharmacy (Name, [facility], [place]): ?[facility], [place] Phone:  [phone]  ?Fax:  [phone]  ?  ? ?3. On Med List: y ? ?4. Last Visit with PCP: ? ?5. Next visit date with PCP: ? ?Pt states pharmacy told her no more refills, and recommended she call PCP.  ? ?Agent: Please be advised that RX refills may take up to 3 business days. We ask that you follow-up with your pharmacy.  ?

## 2021-07-06 MED ORDER — ALPRAZOLAM 0.25 MG PO TABS: 0.2500 mg | ORAL_TABLET | Freq: Two times a day (BID) | ORAL | 1 refills | Status: DC | PRN

## 2021-07-06 NOTE — Addendum Note (Signed)
Addended by: Earnstine Regal on: 07/06/2021 01:34 PM ? ? Modules accepted: Orders ? ?

## 2021-07-06 NOTE — Telephone Encounter (Signed)
Pt checking status on alprazolam../lmb ?

## 2021-07-08 ENCOUNTER — Telehealth: Payer: Self-pay

## 2021-07-08 ENCOUNTER — Other Ambulatory Visit: Payer: Self-pay

## 2021-07-08 MED ORDER — FLUCONAZOLE 150 MG PO TABS: ORAL_TABLET | ORAL | 0 refills | Status: DC

## 2021-07-08 NOTE — Telephone Encounter (Signed)
Spoke with patient and confirmed pharmacy and reviewed Rx directions. Rx sent. ?

## 2021-07-08 NOTE — Telephone Encounter (Signed)
Vanessa Bruins, MD  You 12 minutes ago (11:57 AM)  ? ?Yes agree.   ? ?

## 2021-07-08 NOTE — Telephone Encounter (Signed)
Patient said she was diagnosed with upper respiratory infection last week at Urgent Care and started on Amoxicillin. She is having yeast infection symptoms now and asked if Dr. Marguerita Merles will send a Diflucan tab in for her. ?

## 2021-07-12 ENCOUNTER — Ambulatory Visit (INDEPENDENT_AMBULATORY_CARE_PROVIDER_SITE_OTHER): Payer: BC Managed Care – PPO

## 2021-07-12 ENCOUNTER — Ambulatory Visit: Payer: BC Managed Care – PPO

## 2021-07-12 DIAGNOSIS — E538 Deficiency of other specified B group vitamins: Secondary | ICD-10-CM | POA: Diagnosis not present

## 2021-07-12 MED ORDER — CYANOCOBALAMIN 1000 MCG/ML IJ SOLN
1000.0000 ug | Freq: Once | INTRAMUSCULAR | Status: AC
  Administered 2021-07-12: 1000 ug via INTRAMUSCULAR

## 2021-07-12 NOTE — Progress Notes (Addendum)
Pt here for monthly B12 injection per Dr. Alain Marion ? ?B12 1086mg given IM, and pt tolerated injection well. ? ?Next B12 injection scheduled for 08/11/21 ? ?Medical screening examination/treatment/procedure(s) were performed by non-physician practitioner and as supervising physician I was immediately available for consultation/collaboration.  I agree with above. ALew Dawes MD ? ?

## 2021-08-06 ENCOUNTER — Telehealth: Payer: Self-pay | Admitting: *Deleted

## 2021-08-06 NOTE — Telephone Encounter (Signed)
Patient called stating she took a bath in dove soap and now feels burning when she urinates and external irritation, no itching, no odor.  Patient said it doesn't feel like UTI burning, more so of skin when the urine hits. Patient said she has diflucan tablet she will try to see if that will help with irritation, I also told her she could use small amount of Vaseline externally only to this area. If no improvement to call next to schedule office visit.  ?

## 2021-08-09 ENCOUNTER — Ambulatory Visit: Payer: BC Managed Care – PPO

## 2021-08-11 ENCOUNTER — Ambulatory Visit (INDEPENDENT_AMBULATORY_CARE_PROVIDER_SITE_OTHER): Payer: BC Managed Care – PPO

## 2021-08-11 DIAGNOSIS — E538 Deficiency of other specified B group vitamins: Secondary | ICD-10-CM | POA: Diagnosis not present

## 2021-08-11 MED ORDER — CYANOCOBALAMIN 1000 MCG/ML IJ SOLN
1000.0000 ug | Freq: Once | INTRAMUSCULAR | Status: AC
  Administered 2021-08-11: 1000 ug via INTRAMUSCULAR

## 2021-08-11 NOTE — Progress Notes (Signed)
Pt here for monthly B12 injection per Dr. Alain Marion ? ?B12 1029mg given IM, and pt tolerated injection well. ? ?Next B12 injection scheduled for 09/15/21 ?

## 2021-08-22 ENCOUNTER — Encounter: Payer: Self-pay | Admitting: Emergency Medicine

## 2021-08-22 ENCOUNTER — Ambulatory Visit
Admission: EM | Admit: 2021-08-22 | Discharge: 2021-08-22 | Disposition: A | Discharge status: Home / Self Care | Payer: BC Managed Care – PPO | Attending: Emergency Medicine | Admitting: Emergency Medicine

## 2021-08-22 ENCOUNTER — Other Ambulatory Visit: Payer: Self-pay

## 2021-08-22 DIAGNOSIS — Z20822 Contact with and (suspected) exposure to covid-19: Secondary | ICD-10-CM | POA: Diagnosis present

## 2021-08-22 DIAGNOSIS — R6883 Chills (without fever): Secondary | ICD-10-CM | POA: Insufficient documentation

## 2021-08-22 DIAGNOSIS — U071 COVID-19: Secondary | ICD-10-CM | POA: Diagnosis not present

## 2021-08-22 DIAGNOSIS — R52 Pain, unspecified: Secondary | ICD-10-CM | POA: Diagnosis not present

## 2021-08-22 DIAGNOSIS — J039 Acute tonsillitis, unspecified: Secondary | ICD-10-CM | POA: Insufficient documentation

## 2021-08-22 DIAGNOSIS — Z20818 Contact with and (suspected) exposure to other bacterial communicable diseases: Secondary | ICD-10-CM | POA: Insufficient documentation

## 2021-08-22 DIAGNOSIS — J358 Other chronic diseases of tonsils and adenoids: Secondary | ICD-10-CM | POA: Insufficient documentation

## 2021-08-22 DIAGNOSIS — J029 Acute pharyngitis, unspecified: Secondary | ICD-10-CM | POA: Diagnosis not present

## 2021-08-22 LAB — POCT RAPID STREP A (OFFICE): Rapid Strep A Screen: NEGATIVE

## 2021-08-22 MED ORDER — IPRATROPIUM BROMIDE 0.06 % NA SOLN: 2.0000 | Freq: Three times a day (TID) | NASAL | 1 refills | Status: DC

## 2021-08-22 MED ORDER — PAXLOVID (150/100) 10 X 150 MG & 10 X 100MG PO TBPK: 3.0000 | ORAL_TABLET | Freq: Two times a day (BID) | ORAL | 0 refills | Status: AC

## 2021-08-22 MED ORDER — CEFDINIR 300 MG PO CAPS: 300.0000 mg | ORAL_CAPSULE | Freq: Two times a day (BID) | ORAL | 0 refills | Status: AC

## 2021-08-22 MED ORDER — FLUCONAZOLE 150 MG PO TABS: ORAL_TABLET | ORAL | 0 refills | Status: DC

## 2021-08-22 NOTE — Discharge Instructions (Addendum)
Your symptoms and physical exam findings are concerning for a viral respiratory infection.     You were tested for COVID-19 today.  The result of your COVID-19 test will be posted to your MyChart once it is complete, typically this takes 36 to 48 hours.  If you have had COVID test, please let us know if it is positive and we will prescribe Paxlovid for you cancel the COVID test that we performed here today.  Your strep test today is negative.  Streptococcal throat culture will be performed per our protocol.  The result of your throat culture will be posted to your MyChart once it is complete, this typically takes 3 to 5 days.     Based on my physical exam findings and the history you provided to me today, I recommend that you begin antibiotics now for presumed strep throat instead of waiting for the strep culture result.  I have sent a prescription to your pharmacy.  After 24 hours of antibiotics, you should begin to feel significantly better.     After 24 hours of taking antibiotics, please discard your toothbrush as well as any other oral devices that you are currently using and replace them with new ones to avoid reinfection.   If your streptococcal throat culture has a negative result but you feel significantly better after taking antibiotics for 24 to 48 hours, I strongly recommend that you finish the full 10-day course due to your exposure.  Bacterial culture tests are only as reliable as the laboratory technician performing them.  Alternately, if your streptococcal throat culture has a negative result and you see no improvement of your symptoms after 24 to 48 hours of antibiotics, please discontinue the antibiotics as they are no longer indicated.  Your throat infection will then be most likely considered viral and will have to resolve on its own.   Once you have been on antibiotics for a full 24 hours, you are no longer considered contagious.   I have provided you with a note to return to work.    Even if you are feeling better, please make sure that you finish the full 10-day course and do not skip any doses.  Failure to complete a full course of antibiotics for strep throat can result in worsening infection that may require longer treatment with stronger antibiotics.   Omnicef (cefdinir):  1 capsule twice daily for 10 days, you can take it with or without food.  This antibiotic can cause upset stomach, this will resolve once antibiotics are complete.  You are welcome to use a probiotic, eat yogurt, take Imodium while taking this medication.  Please avoid other systemic medications such as Maalox, Pepto-Bismol or milk of magnesia as they can interfere with your body's ability to absorb the antibiotics.   It is very important that you take antibiotics as prescribed.  If you skip doses or do not complete the full course of antibiotics, you put yourself at significant risk of recurrent infection which can often be worse than your initial infection.   Diflucan (fluconazole): As you may or may not be aware, taking antibiotics can often cause patients to develop a vaginal yeast infection.  For this reason, I have provided you with a prescription for Diflucan.  Please take the first Diflucan tablet on day 3 or 4 of your antibiotic therapy, and take the second Diflucan tablet 3 days later.  You do not need to pick up this prescription or take this medication unless you  develop symptoms of vaginal yeast infection including thick, white vaginal discharge and/or vaginal itching.  This prescription has been provided as a Manufacturing engineer and for your convenience. Please see the list below for recommended medications, dosages and frequencies to provide relief of your current symptoms:     Advil, Motrin (ibuprofen): This is a good anti-inflammatory medication which addresses aches, pains and inflammation of the upper airways that causes sinus and nasal congestion as well as in the lower airways which makes your cough  feel tight and sometimes burn.  I recommend that you take between 400 to 600 mg every 6-8 hours as needed.      Tylenol (acetaminophen): This is a good fever reducer.  If your body temperature rises above 101.5 as measured with a thermometer, it is recommended that you take 1,000 mg every 8 hours until your temperature falls below 101.5, please not take more than 3,000 mg of acetaminophen either as a separate medication or as in ingredient in an over-the-counter cold/flu preparation within a 24-hour period.      Flonase (fluticasone): This is a steroid nasal spray that you use once daily, 1 spray in each nare.  After 3 to 5 days of use, you will have significant improvement of the inflammation and mucus production that is being caused by exposure to allergens.  This medication can be purchased over-the-counter however I have provided you with a prescription.      Promethazine DM: Promethazine is both a nasal decongestant and an antinausea medication that makes most patients feel fairly sleepy.  The DM is dextromethorphan, a cough suppressant found in many over-the-counter cough medications.  Please take 5 mL before bedtime to minimize your cough which will help you sleep better.  I have provided you with a prescription for this medication.      Please follow-up within the next 7-10 days either with your primary care provider or urgent care if your symptoms do not resolve.  If you do not have a primary care provider, we will assist you in finding one.   Thank you for visiting urgent care today.  We appreciate the opportunity to participate in your care.

## 2021-08-22 NOTE — ED Provider Notes (Signed)
[facility]    CSN: [id number] Arrival date & time: 08/22/21  2505    HISTORY   Chief Complaint  Patient presents with   Cough   HPI Vanessa [name] is a 61 y.o. female. Pt presents urgent care today complaining of sore throat, nonproductive cough, nasal congestion with clear rhinorrhea, body aches and chills x 3 days.  Patient states she has been around her grandchildren who both tested positive for strep few days ago as well as having been around her daughter-in-law who lives with her and is a Radio producer, states daughter-in-law tested positive for COVID-19 3 days ago.  Patient endorses headache, mild nausea, denies loss of taste or smell, vomiting, diarrhea  The history is provided by the patient.  Past Medical History:  Diagnosis Date   Allergy    rhinitis   Anxiety    B12 deficiency    Cyst    gangeous- left hand   Depression    Hyperlipidemia    Hypothyroidism    Hypothyroidism    Kidney problem    Tobacco abuse    Vitamin D deficiency 11/2017   Value 21   Patient Active Problem List   Diagnosis Date Noted   Class 1 obesity with serious comorbidity and body mass index (BMI) of 32.0 to 32.9 in adult 10/28/2020   GERD (gastroesophageal reflux disease) 10/21/2020   Proteinuria 05/19/2020   Renal insufficiency 05/19/2020   Bowel habit changes 03/18/2020   Hand eczema 02/17/2020   Plantar fasciitis, right 12/07/2018   Flank pain 02/02/2018   Acute pain of right shoulder 12/17/2017   Strep throat 09/17/2016   Tick bite of back 09/17/2016   Menopause 03/25/2016   Hypertension, renal disease, stage 1-4 or unspecified chronic kidney disease 07/21/2015   Vitamin D deficiency 07/21/2015   Cough 06/28/2015   Trigger thumb of left hand 11/20/2014   Trigger thumb 04/23/2014   Rash and nonspecific skin eruption 04/23/2014   Abscess of skin of abdomen 04/09/2014   Onychomycosis 04/09/2014   Pain of right thumb 04/04/2014   Smoker 06/26/2013   Eye pain  11/09/2012   Right-sided chest wall pain 09/11/2012   Well adult exam 12/28/2010   PSORIASIS 02/02/2010   SINUSITIS, ACUTE 08/28/2009   B12 deficiency 02/10/2009   Anxiety disorder 08/11/2008   DEPRESSION 08/11/2008   Acute upper respiratory infection 06/03/2008   INSOMNIA, PERSISTENT 02/14/2008   PHARYNGITIS 02/14/2008   FATIGUE 02/14/2008   SWEATING 02/14/2008   TOBACCO USE DISORDER/SMOKER-SMOKING CESSATION DISCUSSED 06/08/2007   Hypothyroidism 03/07/2007   Allergic rhinitis 03/07/2007   WEIGHT GAIN 03/07/2007   ABNORMAL GLUCOSE NEC 03/07/2007   Dyslipidemia 10/27/2006   Past Surgical History:  Procedure Laterality Date   [place] CYST EXCISION  2009   right ankle   OB History     Gravida  2   Para  2   Term      Preterm      AB      Living  2      SAB      IAB      Ectopic      Multiple      Live Births             Home Medications    Prior to Admission medications   Medication Sig Start Date End Date Taking? Authorizing Provider  ALPRAZolam Duanne Moron) 0.25 MG tablet Take 1 tablet by mouth  twice daily as needed 07/08/21   Plotnikov, Evie Lacks, MD  ALPRAZolam (XANAX) 0.25 MG tablet Take 1 tablet (0.25 mg total) by mouth 2 (two) times daily as needed. 07/06/21   Plotnikov, Evie Lacks, MD  fluconazole (DIFLUCAN) 150 MG tablet Take one tab po every other day as needed. 07/08/21   Princess Bruins, MD  levothyroxine (SYNTHROID) 88 MCG tablet Take 1 tablet by mouth once daily 03/03/21   Plotnikov, Evie Lacks, MD  montelukast (SINGULAIR) 10 MG tablet Take 1 tablet (10 mg total) by mouth at bedtime. 07/02/21   Hazel Sams, PA-C  olopatadine (PATANOL) 0.1 % ophthalmic solution Place 1 drop into both eyes 2 (two) times daily. 07/02/21   Hazel Sams, PA-C  VITAMIN D PO Take by mouth.    [provider]   Family History Family History  Problem Relation Age of Onset   Thyroid disease Mother        Low    Other Mother        B12 deficiency   Diabetes Mother    Hyperlipidemia Mother    Heart disease Father 84       CAD   Hyperlipidemia Father    Heart failure Father    Sudden death Father    Coronary artery disease Other    Hyperlipidemia Other    Psoriasis Son    Colon cancer Neg Hx    Social History Social History   Tobacco Use   Smoking status: Every Day    Types: Cigarettes   Smokeless tobacco: Never  Vaping Use   Vaping Use: Never used  Substance Use Topics   Alcohol use: Not Currently    Alcohol/week: 0.0 standard drinks   Drug use: No   Allergies   Atorvastatin, Crestor [rosuvastatin calcium], Ezetimibe-simvastatin, Hctz [hydrochlorothiazide], and Hydrocodone bit-homatrop mbr  Review of Systems Review of Systems Pertinent findings noted in history of present illness.   Physical Exam Triage Vital Signs ED Triage Vitals  Enc Vitals Group     BP 01/22/21 0827 (!) 147/82     Pulse Rate 01/22/21 0827 72     Resp 01/22/21 0827 18     Temp 01/22/21 0827 98.3 F (36.8 C)     Temp Source 01/22/21 0827 Oral     SpO2 01/22/21 0827 98 %     Weight --      Height --      Head Circumference --      Peak Flow --      Pain Score 01/22/21 0826 5     Pain Loc --      Pain Edu? --      Excl. in [place]? --   No data found.  Updated Vital Signs BP (!) 156/99 (BP Location: Left Arm)   Pulse 87   Temp 98.5 F (36.9 C) (Oral)   Resp 18   LMP 03/08/2006   SpO2 96%   Physical Exam Constitutional:      General: She is not in acute distress.    Appearance: She is well-developed. She is ill-appearing. She is not toxic-appearing.  HENT:     Head: Normocephalic and atraumatic.     Salivary Glands: Right salivary gland is diffusely enlarged and tender. Left salivary gland is diffusely enlarged and tender.     Right Ear: Hearing and external ear normal.     Left Ear: Hearing and external ear normal.     Ears:     Comments: Bilateral EACs with  mild erythema, bilateral TMs  are normal    Nose: No mucosal edema, congestion or rhinorrhea.     Right Turbinates: Not enlarged, swollen or pale.     Left Turbinates: Not enlarged or swollen.     Right Sinus: No maxillary sinus tenderness or frontal sinus tenderness.     Left Sinus: No maxillary sinus tenderness or frontal sinus tenderness.     Mouth/Throat:     Lips: Pink. No lesions.     Mouth: Mucous membranes are moist. No oral lesions or angioedema.     Dentition: No gingival swelling.     Tongue: No lesions.     Palate: No mass.     Pharynx: Uvula midline. Pharyngeal swelling, oropharyngeal exudate and posterior oropharyngeal erythema present. No uvula swelling.     Tonsils: Tonsillar exudate present. 2+ on the right. 2+ on the left.  Eyes:     Extraocular Movements: Extraocular movements intact.     Conjunctiva/sclera: Conjunctivae normal.     Pupils: Pupils are equal, round, and reactive to light.  Neck:     Thyroid: No thyroid mass, thyromegaly or thyroid tenderness.     Trachea: Tracheal tenderness present. No abnormal tracheal secretions or tracheal deviation.     Comments: Voice is muffled Cardiovascular:     Rate and Rhythm: Normal rate and regular rhythm.     Pulses: Normal pulses.     Heart sounds: Normal heart sounds, S1 normal and S2 normal. No murmur heard.   No friction rub. No gallop.  Pulmonary:     Effort: Pulmonary effort is normal. No accessory muscle usage, prolonged expiration, respiratory distress or retractions.     Breath sounds: No stridor, decreased air movement or transmitted upper airway sounds. No decreased breath sounds, wheezing, rhonchi or rales.  Abdominal:     General: Bowel sounds are normal.     Palpations: Abdomen is soft.     Tenderness: There is generalized abdominal tenderness. There is no right CVA tenderness, left CVA tenderness or rebound. Negative signs include Murphy's sign.     Hernia: No hernia is present.  Musculoskeletal:        General: No tenderness.  Normal range of motion.     Cervical back: Full passive range of motion without pain, normal range of motion and neck supple.     Right lower leg: No edema.     Left lower leg: No edema.  Lymphadenopathy:     Cervical: Cervical adenopathy present.     Right cervical: Superficial cervical adenopathy present.     Left cervical: Superficial cervical adenopathy present.  Skin:    General: Skin is warm and dry.     Findings: No erythema, lesion or rash.  Neurological:     General: No focal deficit present.     Mental Status: She is alert and oriented to person, place, and time. Mental status is at baseline.  Psychiatric:        Mood and Affect: Mood normal.        Behavior: Behavior normal.        Thought Content: Thought content normal.        Judgment: Judgment normal.    Visual Acuity Right Eye Distance:   Left Eye Distance:   Bilateral Distance:    Right Eye Near:   Left Eye Near:    Bilateral Near:     UC Couse / Diagnostics / Procedures:    EKG  Radiology No results found.  Procedures Procedures (  including critical care time)  UC Diagnoses / Final Clinical Impressions(s)   I have reviewed the triage vital signs and the nursing notes.  Pertinent labs & imaging results that were available during my care of the patient were reviewed by me and considered in my medical decision making (see chart for details).   Final diagnoses:  Acute pharyngitis, unspecified etiology  Generalized body aches  Chills  Exposure to strep throat  Exposure to COVID-19 virus  Acute tonsillitis, unspecified etiology  Tonsillar exudate  COVID-19   Unfortunately, rapid strep test today despite patient's physical exam findings being consistent with acute streptococcal pharyngitis.  Patient advised to begin cefdinir for empiric treatment of presumed bacterial pharyngitis at this time, throat culture will be performed, patient advised that if cefdinir provides her immediate relief of her  throat pain and swelling, she should continue cefdinir for full 10 days despite results of streptococcal throat culture because she may have bacterial infection of another organism.  Patient politely declined in-house COVID-19 test during her visit today due to not wanting to wait 2 to 3 days for the results.  Patient states she has a home COVID-19 test which will be much faster.  Patient is a very reliable historian today.  Patient advised that if she test positive for COVID-19 at home, can reach out to [place] here at urgent care before we close to let us know and I will be happy to provide her with a prescription for Paxlovid.  Of note, patient called the clinic and he minutes after she was discharged to let us know her home COVID-19 test was positive.  Patient was provided with a prescription for Paxlovid.    ED Prescriptions     Medication Sig Dispense Auth. Provider   cefdinir (OMNICEF) 300 MG capsule Take 1 capsule (300 mg total) by mouth 2 (two) times daily for 10 days. 20 capsule Lynden Oxford Scales, PA-C   ipratropium (ATROVENT) 0.06 % nasal spray Place 2 sprays into both nostrils 3 (three) times daily. As needed for nasal congestion, runny nose 15 mL Lynden Oxford Scales, PA-C   fluconazole (DIFLUCAN) 150 MG tablet Take first tablet on the third day of cefdinir.  Take second tablet 3 days after first tablet.  Take third tablet 3 days after second tablet. 3 tablet Lynden Oxford Scales, PA-C   nirmatrelvir & ritonavir (PAXLOVID, 150/100,) 10 x 150 MG & 10 x '100MG'$  TBPK Take 3 tablets by mouth 2 (two) times daily for 5 days. 30 tablet Lynden Oxford Scales, PA-C      PDMP not reviewed this encounter.  Pending results:  Labs Reviewed  CULTURE, GROUP A STREP ([place])  NOVEL CORONAVIRUS, NAA  POCT RAPID STREP A (OFFICE)    Medications Ordered in UC: Medications - No data to display  Disposition Upon Discharge:  Condition: stable for discharge home Home: take medications as  prescribed; routine discharge instructions as discussed; follow up as advised.  Patient presented with an acute illness with associated systemic symptoms and significant discomfort requiring urgent management. In my opinion, this is a condition that a prudent lay person (someone who possesses an average knowledge of health and medicine) may potentially expect to result in complications if not addressed urgently such as respiratory distress, impairment of bodily function or dysfunction of bodily organs.   Routine symptom specific, illness specific and/or disease specific instructions were discussed with the patient and/or caregiver at length.   As such, the patient has been evaluated and assessed, work-up was  performed and treatment was provided in alignment with urgent care protocols and evidence based medicine.  Patient/parent/caregiver has been advised that the patient may require follow up for further testing and treatment if the symptoms continue in spite of treatment, as clinically indicated and appropriate.  If the patient was tested for COVID-19, Influenza and/or RSV, then the patient/parent/guardian was advised to isolate at home pending the results of his/her diagnostic coronavirus test and potentially longer if they're positive. I have also advised pt that if his/her COVID-19 test returns positive, it's recommended to self-isolate for at least 10 days after symptoms first appeared AND until fever-free for 24 hours without fever reducer AND other symptoms have improved or resolved. Discussed self-isolation recommendations as well as instructions for household member/close contacts as per the [facility] and [place] DHHS, and also gave patient the [facility] packet with this information.  Patient/parent/caregiver has been advised to return to the [facility] or PCP in 3-5 days if no better; to PCP or the Emergency Department if new signs and symptoms develop, or if the current signs or symptoms continue to change or worsen  for further workup, evaluation and treatment as clinically indicated and appropriate  The patient will follow up with their current PCP if and as advised. If the patient does not currently have a PCP we will assist them in obtaining one.   The patient may need specialty follow up if the symptoms continue, in spite of conservative treatment and management, for further workup, evaluation, consultation and treatment as clinically indicated and appropriate.  Patient/parent/caregiver verbalized understanding and agreement of plan as discussed.  All questions were addressed during visit.  Please see discharge instructions below for further details of plan.  Discharge Instructions:   Discharge Instructions      Your symptoms and physical exam findings are concerning for a viral respiratory infection.     You were tested for COVID-19 today.  The result of your COVID-19 test will be posted to your MyChart once it is complete, typically this takes 36 to 48 hours.  If you have had COVID test, please let us know if it is positive and we will prescribe Paxlovid for you cancel the COVID test that we performed here today.  Your strep test today is negative.  Streptococcal throat culture will be performed per our protocol.  The result of your throat culture will be posted to your MyChart once it is complete, this typically takes 3 to 5 days.     Based on my physical exam findings and the history you provided to me today, I recommend that you begin antibiotics now for presumed strep throat instead of waiting for the strep culture result.  I have sent a prescription to your pharmacy.  After 24 hours of antibiotics, you should begin to feel significantly better.     After 24 hours of taking antibiotics, please discard your toothbrush as well as any other oral devices that you are currently using and replace them with new ones to avoid reinfection.   If your streptococcal throat culture has a negative result but  you feel significantly better after taking antibiotics for 24 to 48 hours, I strongly recommend that you finish the full 10-day course due to your exposure.  Bacterial culture tests are only as reliable as the laboratory technician performing them.  Alternately, if your streptococcal throat culture has a negative result and you see no improvement of your symptoms after 24 to 48 hours of antibiotics, please discontinue the antibiotics  as they are no longer indicated.  Your throat infection will then be most likely considered viral and will have to resolve on its own.   Once you have been on antibiotics for a full 24 hours, you are no longer considered contagious.   I have provided you with a note to return to work.   Even if you are feeling better, please make sure that you finish the full 10-day course and do not skip any doses.  Failure to complete a full course of antibiotics for strep throat can result in worsening infection that may require longer treatment with stronger antibiotics.   Omnicef (cefdinir):  1 capsule twice daily for 10 days, you can take it with or without food.  This antibiotic can cause upset stomach, this will resolve once antibiotics are complete.  You are welcome to use a probiotic, eat yogurt, take Imodium while taking this medication.  Please avoid other systemic medications such as Maalox, Pepto-Bismol or milk of magnesia as they can interfere with your body's ability to absorb the antibiotics.   It is very important that you take antibiotics as prescribed.  If you skip doses or do not complete the full course of antibiotics, you put yourself at significant risk of recurrent infection which can often be worse than your initial infection.   Diflucan (fluconazole): As you may or may not be aware, taking antibiotics can often cause patients to develop a vaginal yeast infection.  For this reason, I have provided you with a prescription for Diflucan.  Please take the first Diflucan  tablet on day 3 or 4 of your antibiotic therapy, and take the second Diflucan tablet 3 days later.  You do not need to pick up this prescription or take this medication unless you develop symptoms of vaginal yeast infection including thick, white vaginal discharge and/or vaginal itching.  This prescription has been provided as a Manufacturing engineer and for your convenience. Please see the list below for recommended medications, dosages and frequencies to provide relief of your current symptoms:     Advil, Motrin (ibuprofen): This is a good anti-inflammatory medication which addresses aches, pains and inflammation of the upper airways that causes sinus and nasal congestion as well as in the lower airways which makes your cough feel tight and sometimes burn.  I recommend that you take between 400 to 600 mg every 6-8 hours as needed.      Tylenol (acetaminophen): This is a good fever reducer.  If your body temperature rises above 101.5 as measured with a thermometer, it is recommended that you take 1,000 mg every 8 hours until your temperature falls below 101.5, please not take more than 3,000 mg of acetaminophen either as a separate medication or as in ingredient in an over-the-counter cold/flu preparation within a 24-hour period.      Flonase (fluticasone): This is a steroid nasal spray that you use once daily, 1 spray in each nare.  After 3 to 5 days of use, you will have significant improvement of the inflammation and mucus production that is being caused by exposure to allergens.  This medication can be purchased over-the-counter however I have provided you with a prescription.      Promethazine DM: Promethazine is both a nasal decongestant and an antinausea medication that makes most patients feel fairly sleepy.  The DM is dextromethorphan, a cough suppressant found in many over-the-counter cough medications.  Please take 5 mL before bedtime to minimize your cough which will help you sleep  better.  I have provided  you with a prescription for this medication.      Please follow-up within the next 7-10 days either with your primary care provider or urgent care if your symptoms do not resolve.  If you do not have a primary care provider, we will assist you in finding one.   Thank you for visiting urgent care today.  We appreciate the opportunity to participate in your care.       This office note has been dictated using Museum/gallery curator.  Unfortunately, and despite my best efforts, this method of dictation can sometimes lead to occasional typographical or grammatical errors.  I apologize in advance if this occurs.     Lynden Oxford Scales, PA-C 08/23/21 1439

## 2021-08-22 NOTE — ED Triage Notes (Signed)
Pt sts cough and congestion with body aches x 3 days

## 2021-08-25 ENCOUNTER — Ambulatory Visit: Payer: BC Managed Care – PPO | Admitting: Internal Medicine

## 2021-08-25 ENCOUNTER — Telehealth: Payer: Self-pay

## 2021-08-25 LAB — CULTURE, GROUP A STREP (THRC): Special Requests: NORMAL

## 2021-09-01 ENCOUNTER — Telehealth: Payer: Self-pay | Admitting: Internal Medicine

## 2021-09-01 NOTE — Telephone Encounter (Signed)
Patient tested positive for covid last week - was seen at Urgent care and given paxlovid - is still experiencing headaches and stuffy nose.  Would like to know if there is anything that she can take over the counter.  Made appointment with Harland Dingwall on Friday

## 2021-09-02 ENCOUNTER — Encounter: Payer: Self-pay | Admitting: Emergency Medicine

## 2021-09-02 ENCOUNTER — Ambulatory Visit (INDEPENDENT_AMBULATORY_CARE_PROVIDER_SITE_OTHER): Payer: BC Managed Care – PPO | Admitting: Emergency Medicine

## 2021-09-02 VITALS — BP 126/82 | HR 76 | Temp 98.1°F | Ht 62.25 in | Wt 176.0 lb

## 2021-09-02 DIAGNOSIS — F17200 Nicotine dependence, unspecified, uncomplicated: Secondary | ICD-10-CM | POA: Diagnosis not present

## 2021-09-02 DIAGNOSIS — U071 COVID-19: Secondary | ICD-10-CM

## 2021-09-02 DIAGNOSIS — Z8616 Personal history of COVID-19: Secondary | ICD-10-CM | POA: Insufficient documentation

## 2021-09-02 NOTE — Patient Instructions (Signed)

## 2021-09-02 NOTE — Progress Notes (Signed)
Vanessa [name] EUMPNT 61 y.o.   Chief Complaint  Patient presents with   covid postive     More than a week ago and still having symptoms    HISTORY OF PRESENT ILLNESS: Acute problem visit today.  Patient of Dr. Cristie Hem Plotnikov. This is a 61 y.o. female here for follow-up of COVID infection. Day 1 of symptoms was May 26.  Tested + May 27.  Went to urgent care clinic on May 28 and was started on Paxlovid.  Was also started on antibiotic for possible throat infection.  Throat culture came back negative.  Patient tested negative for COVID last Sunday.  Returned to work on Monday.  Was feeling fine.  However some symptoms came back on Tuesday.  Still having some congestion and cough but overall slowly getting better. Needs work note.  HPI   Prior to Admission medications   Medication Sig Start Date End Date Taking? Authorizing Provider  ALPRAZolam Duanne Moron) 0.25 MG tablet Take 1 tablet by mouth twice daily as needed 07/08/21  Yes Plotnikov, Evie Lacks, MD  ALPRAZolam (XANAX) 0.25 MG tablet Take 1 tablet (0.25 mg total) by mouth 2 (two) times daily as needed. 07/06/21  Yes Plotnikov, Evie Lacks, MD  fluconazole (DIFLUCAN) 150 MG tablet Take first tablet on the third day of cefdinir.  Take second tablet 3 days after first tablet.  Take third tablet 3 days after second tablet. 08/22/21  Yes Lynden Oxford Scales, PA-C  ipratropium (ATROVENT) 0.06 % nasal spray Place 2 sprays into both nostrils 3 (three) times daily. As needed for nasal congestion, runny nose 08/22/21  Yes Lynden Oxford Scales, PA-C  levothyroxine (SYNTHROID) 88 MCG tablet Take 1 tablet by mouth once daily 03/03/21  Yes Plotnikov, Evie Lacks, MD  VITAMIN D PO Take by mouth.   Yes [provider]    Allergies  Allergen Reactions   Atorvastatin     REACTION: aches   Crestor [Rosuvastatin Calcium]     achy   Ezetimibe-Simvastatin     REACTION: hives   Hctz [Hydrochlorothiazide]     incontinence   Hydrocodone Bit-Homatrop Mbr  Itching    Patient Active Problem List   Diagnosis Date Noted   Class 1 obesity with serious comorbidity and body mass index (BMI) of 32.0 to 32.9 in adult 10/28/2020   GERD (gastroesophageal reflux disease) 10/21/2020   Proteinuria 05/19/2020   Renal insufficiency 05/19/2020   Bowel habit changes 03/18/2020   Hand eczema 02/17/2020   Plantar fasciitis, right 12/07/2018   Flank pain 02/02/2018   Acute pain of right shoulder 12/17/2017   Strep throat 09/17/2016   Tick bite of back 09/17/2016   Menopause 03/25/2016   Hypertension, renal disease, stage 1-4 or unspecified chronic kidney disease 07/21/2015   Vitamin D deficiency 07/21/2015   Cough 06/28/2015   Trigger thumb of left hand 11/20/2014   Trigger thumb 04/23/2014   Rash and nonspecific skin eruption 04/23/2014   Abscess of skin of abdomen 04/09/2014   Onychomycosis 04/09/2014   Pain of right thumb 04/04/2014   Smoker 06/26/2013   Eye pain 11/09/2012   Right-sided chest wall pain 09/11/2012   Well adult exam 12/28/2010   PSORIASIS 02/02/2010   SINUSITIS, ACUTE 08/28/2009   B12 deficiency 02/10/2009   Anxiety disorder 08/11/2008   DEPRESSION 08/11/2008   Acute upper respiratory infection 06/03/2008   INSOMNIA, PERSISTENT 02/14/2008   PHARYNGITIS 02/14/2008   FATIGUE 02/14/2008   SWEATING 02/14/2008   TOBACCO USE DISORDER/SMOKER-SMOKING CESSATION DISCUSSED 06/08/2007  Hypothyroidism 03/07/2007   Allergic rhinitis 03/07/2007   WEIGHT GAIN 03/07/2007   ABNORMAL GLUCOSE NEC 03/07/2007   Dyslipidemia 10/27/2006    Past Medical History:  Diagnosis Date   Allergy    rhinitis   Anxiety    B12 deficiency    Cyst    gangeous- left hand   Depression    Hyperlipidemia    Hypothyroidism    Hypothyroidism    Kidney problem    Tobacco abuse    Vitamin D deficiency 11/2017   Value 21    Past Surgical History:  Procedure Laterality Date   CESAREAN SECTION  1992   EYE SURGERY     GANGLION CYST EXCISION  2009    right ankle    Social History   Socioeconomic History   Marital status: Married    Spouse name: Not on file   Number of children: Not on file   Years of education: Not on file   Highest education level: Not on file  Occupational History   Occupation: Electrical engineer  Tobacco Use   Smoking status: Every Day    Types: Cigarettes   Smokeless tobacco: Never  Vaping Use   Vaping Use: Never used  Substance and Sexual Activity   Alcohol use: Not Currently    Alcohol/week: 0.0 standard drinks of alcohol   Drug use: No   Sexual activity: Yes    Birth control/protection: Post-menopausal    Comment: 1st intercourse 61 yo-Fewer than 5 partners  Other Topics Concern   Not on file  Social History Narrative   Not on file   Social Determinants of Health   Financial Resource Strain: Not on file  Food Insecurity: Not on file  Transportation Needs: Not on file  Physical Activity: Not on file  Stress: Not on file  Social Connections: Not on file  Intimate Partner Violence: Not on file    Family History  Problem Relation Age of Onset   Thyroid disease Mother        Low   Other Mother        B12 deficiency   Diabetes Mother    Hyperlipidemia Mother    Heart disease Father 21       CAD   Hyperlipidemia Father    Heart failure Father    Sudden death Father    Coronary artery disease Other    Hyperlipidemia Other    Psoriasis Son    Colon cancer Neg Hx      Review of Systems  Constitutional:  Positive for malaise/fatigue. Negative for chills and fever.  HENT:  Positive for congestion. Negative for ear pain and sore throat.   Respiratory:  Positive for cough. Negative for shortness of breath.   Cardiovascular:  Negative for chest pain and palpitations.  Gastrointestinal:  Negative for abdominal pain, diarrhea, nausea and vomiting.  Genitourinary: Negative.   Skin:  Negative for rash.  Neurological:  Negative for dizziness and headaches.  All other systems reviewed and are  negative.  Today's Vitals   09/02/21 0940  BP: 126/82  Pulse: 76  Temp: 98.1 F (36.7 C)  TempSrc: Oral  SpO2: 93%  Weight: 176 lb (79.8 kg)  Height: 5' 2.25" (1.581 m)   Body mass index is 31.93 kg/m.   Physical Exam Vitals reviewed.  Constitutional:      Appearance: Normal appearance.  HENT:     Head: Normocephalic.     Right Ear: Tympanic membrane, ear canal and external ear normal.  Left Ear: Tympanic membrane, ear canal and external ear normal.     Mouth/Throat:     Mouth: Mucous membranes are moist.     Pharynx: Oropharynx is clear. No oropharyngeal exudate or posterior oropharyngeal erythema.  Eyes:     Extraocular Movements: Extraocular movements intact.     Conjunctiva/sclera: Conjunctivae normal.     Pupils: Pupils are equal, round, and reactive to light.  Cardiovascular:     Rate and Rhythm: Normal rate and regular rhythm.     Pulses: Normal pulses.     Heart sounds: Normal heart sounds.  Pulmonary:     Effort: Pulmonary effort is normal.     Breath sounds: Normal breath sounds.  Musculoskeletal:        General: Normal range of motion.     Cervical back: No tenderness.  Lymphadenopathy:     Cervical: No cervical adenopathy.  Skin:    General: Skin is warm and dry.     Capillary Refill: Capillary refill takes less than 2 seconds.  Neurological:     General: No focal deficit present.     Mental Status: She is alert and oriented to person, place, and time.  Psychiatric:        Mood and Affect: Mood normal.        Behavior: Behavior normal.      ASSESSMENT & PLAN: A total of 30 minutes was spent with the patient and counseling/coordination of care regarding preparing for this visit, review of most recent office visit notes, review of chronic medical problems, review of all medications, review of most recent blood work results, diagnosis and treatment of COVID infection, prognosis, documentation and need for follow-up.  Problem List Items Addressed  This Visit       Other   Current smoker    Cardiovascular and cancer risks associated with smoking discussed.  Smoking cessation advice given.      COVID-19 virus infection - Primary    Clinically stable.  No complications.  Running its course. Slowly improving.  ED precautions given. COVID instructions given.      Patient Instructions  COVID-19: What to Do if You Are Sick If you test positive and are an older adult or someone who is at high risk of getting very sick from COVID-19, treatment may be available. Contact a healthcare provider right away after a positive test to determine if you are eligible, even if your symptoms are mild right now. You can also visit a Test to Treat location and, if eligible, receive a prescription from a provider. Don't delay: Treatment must be started within the first few days to be effective. If you have a fever, cough, or other symptoms, you might have COVID-19. Most people have mild illness and are able to recover at home. If you are sick: Keep track of your symptoms. If you have an emergency warning sign (including trouble breathing), call 911. Steps to help prevent the spread of COVID-19 if you are sick If you are sick with COVID-19 or think you might have COVID-19, follow the steps below to care for yourself and to help protect other people in your home and community. Stay home except to get medical care Stay home. Most people with COVID-19 have mild illness and can recover at home without medical care. Do not leave your home, except to get medical care. Do not visit public areas and do not go to places where you are unable to wear a mask. Take care of yourself. Get  rest and stay hydrated. Take over-the-counter medicines, such as acetaminophen, to help you feel better. Stay in touch with your doctor. Call before you get medical care. Be sure to get care if you have trouble breathing, or have any other emergency warning signs, or if you think it is an  emergency. Avoid public transportation, ride-sharing, or taxis if possible. Get tested If you have symptoms of COVID-19, get tested. While waiting for test results, stay away from others, including staying apart from those living in your household. Get tested as soon as possible after your symptoms start. Treatments may be available for people with COVID-19 who are at risk for becoming very sick. Don't delay: Treatment must be started early to be effective--some treatments must begin within 5 days of your first symptoms. Contact your healthcare provider right away if your test result is positive to determine if you are eligible. Self-tests are one of several options for testing for the virus that causes COVID-19 and may be more convenient than laboratory-based tests and point-of-care tests. Ask your healthcare provider or your local health department if you need help interpreting your test results. You can visit your state, tribal, local, and territorial health department's website to look for the latest local information on testing sites. Separate yourself from other people As much as possible, stay in a specific room and away from other people and pets in your home. If possible, you should use a separate bathroom. If you need to be around other people or animals in or outside of the home, wear a well-fitting mask. Tell your close contacts that they may have been exposed to COVID-19. An infected person can spread COVID-19 starting 48 hours (or 2 days) before the person has any symptoms or tests positive. By letting your close contacts know they may have been exposed to COVID-19, you are helping to protect everyone. See COVID-19 and Animals if you have questions about pets. If you are diagnosed with COVID-19, someone from the health department may call you. Answer the call to slow the spread. Monitor your symptoms Symptoms of COVID-19 include fever, cough, or other symptoms. Follow care instructions  from your healthcare provider and local health department. Your local health authorities may give instructions on checking your symptoms and reporting information. When to seek emergency medical attention Look for emergency warning signs* for COVID-19. If someone is showing any of these signs, seek emergency medical care immediately: Trouble breathing Persistent pain or pressure in the chest New confusion Inability to wake or stay awake Pale, gray, or blue-colored skin, lips, or nail beds, depending on skin tone *This list is not all possible symptoms. Please call your medical provider for any other symptoms that are severe or concerning to you. Call 911 or call ahead to your local emergency facility: Notify the operator that you are seeking care for someone who has or may have COVID-19. Call ahead before visiting your doctor Call ahead. Many medical visits for routine care are being postponed or done by phone or telemedicine. If you have a medical appointment that cannot be postponed, call your doctor's office, and tell them you have or may have COVID-19. This will help the office protect themselves and other patients. If you are sick, wear a well-fitting mask You should wear a mask if you must be around other people or animals, including pets (even at home). Wear a mask with the best fit, protection, and comfort for you. You don't need to wear the mask if you are  alone. If you can't put on a mask (because of trouble breathing, for example), cover your coughs and sneezes in some other way. Try to stay at least 6 feet away from other people. This will help protect the people around you. Masks should not be placed on young children under age 106 years, anyone who has trouble breathing, or anyone who is not able to remove the mask without help. Cover your coughs and sneezes Cover your mouth and nose with a tissue when you cough or sneeze. Throw away used tissues in a lined trash can. Immediately  wash your hands with soap and water for at least 20 seconds. If soap and water are not available, clean your hands with an alcohol-based hand sanitizer that contains at least 60% alcohol. Clean your hands often Wash your hands often with soap and water for at least 20 seconds. This is especially important after blowing your nose, coughing, or sneezing; going to the bathroom; and before eating or preparing food. Use hand sanitizer if soap and water are not available. Use an alcohol-based hand sanitizer with at least 60% alcohol, covering all surfaces of your hands and rubbing them together until they feel dry. Soap and water are the best option, especially if hands are visibly dirty. Avoid touching your eyes, nose, and mouth with unwashed hands. Handwashing Tips Avoid sharing personal household items Do not share dishes, drinking glasses, cups, eating utensils, towels, or bedding with other people in your home. Wash these items thoroughly after using them with soap and water or put in the dishwasher. Clean surfaces in your home regularly Clean and disinfect high-touch surfaces (for example, doorknobs, tables, handles, light switches, and countertops) in your "sick room" and bathroom. In shared spaces, you should clean and disinfect surfaces and items after each use by the person who is ill. If you are sick and cannot clean, a caregiver or other person should only clean and disinfect the area around you (such as your bedroom and bathroom) on an as needed basis. Your caregiver/other person should wait as long as possible (at least several hours) and wear a mask before entering, cleaning, and disinfecting shared spaces that you use. Clean and disinfect areas that may have blood, stool, or body fluids on them. Use household cleaners and disinfectants. Clean visible dirty surfaces with household cleaners containing soap or detergent. Then, use a household disinfectant. Use a product from [employer] List N:  Disinfectants for Coronavirus (OIZTI-45). Be sure to follow the instructions on the label to ensure safe and effective use of the product. Many products recommend keeping the surface wet with a disinfectant for a certain period of time (look at "contact time" on the product label). You may also need to wear personal protective equipment, such as gloves, depending on the directions on the product label. Immediately after disinfecting, wash your hands with soap and water for 20 seconds. For completed guidance on cleaning and disinfecting your home, visit Complete Disinfection Guidance. Take steps to improve ventilation at home Improve ventilation (air flow) at home to help prevent from spreading COVID-19 to other people in your household. Clear out COVID-19 virus particles in the air by opening windows, using air filters, and turning on fans in your home. Use this interactive tool to learn how to improve air flow in your home. When you can be around others after being sick with COVID-19 Deciding when you can be around others is different for different situations. Find out when you can safely end home  isolation. For any additional questions about your care, contact your healthcare provider or state or local health department. 06/16/2020 Content source: [facility] ([facility]), Division of Viral Diseases This information is not intended to replace advice given to you by your health care provider. Make sure you discuss any questions you have with your health care provider. Document Revised: 12/04/2020 Document Reviewed: 12/04/2020 Elsevier Patient Education  2022 Jerauld, MD [facility]

## 2021-09-02 NOTE — Assessment & Plan Note (Signed)
Cardiovascular and cancer risks associated with smoking discussed. °Smoking cessation advice given. °

## 2021-09-02 NOTE — Assessment & Plan Note (Signed)
Clinically stable.  No complications.  Running its course. Slowly improving.  ED precautions given. COVID instructions given.

## 2021-09-03 ENCOUNTER — Ambulatory Visit: Payer: BC Managed Care – PPO | Admitting: Family Medicine

## 2021-09-14 ENCOUNTER — Ambulatory Visit (INDEPENDENT_AMBULATORY_CARE_PROVIDER_SITE_OTHER): Payer: BC Managed Care – PPO

## 2021-09-14 ENCOUNTER — Ambulatory Visit: Payer: BC Managed Care – PPO | Admitting: Internal Medicine

## 2021-09-14 ENCOUNTER — Encounter: Payer: Self-pay | Admitting: Internal Medicine

## 2021-09-14 VITALS — BP 124/82 | HR 60 | Temp 98.0°F | Resp 18 | Ht 62.0 in | Wt 184.2 lb

## 2021-09-14 DIAGNOSIS — Z8616 Personal history of COVID-19: Secondary | ICD-10-CM

## 2021-09-14 DIAGNOSIS — E039 Hypothyroidism, unspecified: Secondary | ICD-10-CM

## 2021-09-14 DIAGNOSIS — Z23 Encounter for immunization: Secondary | ICD-10-CM | POA: Diagnosis not present

## 2021-09-14 DIAGNOSIS — U099 Post covid-19 condition, unspecified: Secondary | ICD-10-CM | POA: Diagnosis not present

## 2021-09-14 DIAGNOSIS — E538 Deficiency of other specified B group vitamins: Secondary | ICD-10-CM

## 2021-09-14 DIAGNOSIS — E559 Vitamin D deficiency, unspecified: Secondary | ICD-10-CM | POA: Diagnosis not present

## 2021-09-14 DIAGNOSIS — I129 Hypertensive chronic kidney disease with stage 1 through stage 4 chronic kidney disease, or unspecified chronic kidney disease: Secondary | ICD-10-CM

## 2021-09-14 DIAGNOSIS — G9332 Myalgic encephalomyelitis/chronic fatigue syndrome: Secondary | ICD-10-CM | POA: Diagnosis not present

## 2021-09-14 LAB — CBC WITH DIFFERENTIAL/PLATELET
Basophils Absolute: 0.1 10*3/uL (ref 0.0–0.1)
Basophils Relative: 0.8 % (ref 0.0–3.0)
Eosinophils Absolute: 0.5 10*3/uL (ref 0.0–0.7)
Eosinophils Relative: 4.5 % (ref 0.0–5.0)
HCT: 41.8 % (ref 36.0–46.0)
Hemoglobin: 13.8 g/dL (ref 12.0–15.0)
Lymphocytes Relative: 28.9 % (ref 12.0–46.0)
Lymphs Abs: 3.3 10*3/uL (ref 0.7–4.0)
MCHC: 33 g/dL (ref 30.0–36.0)
MCV: 87.7 fl (ref 78.0–100.0)
Monocytes Absolute: 0.9 10*3/uL (ref 0.1–1.0)
Monocytes Relative: 8.3 % (ref 3.0–12.0)
Neutro Abs: 6.5 10*3/uL (ref 1.4–7.7)
Neutrophils Relative %: 57.5 % (ref 43.0–77.0)
Platelets: 306 10*3/uL (ref 150.0–400.0)
RBC: 4.77 Mil/uL (ref 3.87–5.11)
RDW: 15.3 % (ref 11.5–15.5)
WBC: 11.3 10*3/uL — ABNORMAL HIGH (ref 4.0–10.5)

## 2021-09-14 LAB — COMPREHENSIVE METABOLIC PANEL
ALT: 17 U/L (ref 0–35)
AST: 19 U/L (ref 0–37)
Albumin: 4.1 g/dL (ref 3.5–5.2)
Alkaline Phosphatase: 48 U/L (ref 39–117)
BUN: 19 mg/dL (ref 6–23)
CO2: 27 mEq/L (ref 19–32)
Calcium: 9.7 mg/dL (ref 8.4–10.5)
Chloride: 103 mEq/L (ref 96–112)
Creatinine, Ser: 1.08 mg/dL (ref 0.40–1.20)
GFR: 55.5 mL/min — ABNORMAL LOW (ref >60.00)
Glucose, Bld: 78 mg/dL (ref 70–99)
Potassium: 3.8 mEq/L (ref 3.5–5.1)
Sodium: 138 mEq/L (ref 135–145)
Total Bilirubin: 0.3 mg/dL (ref 0.2–1.2)
Total Protein: 7.8 g/dL (ref 6.0–8.3)

## 2021-09-14 LAB — TSH: TSH: 5.67 u[IU]/mL — ABNORMAL HIGH (ref 0.35–5.50)

## 2021-09-14 LAB — T4, FREE: Free T4: 0.86 ng/dL (ref 0.60–1.60)

## 2021-09-14 MED ORDER — CYANOCOBALAMIN 1000 MCG/ML IJ SOLN
1000.0000 ug | Freq: Once | INTRAMUSCULAR | Status: AC
  Administered 2021-09-14: 1000 ug via INTRAMUSCULAR

## 2021-09-14 MED ORDER — ALPRAZOLAM 0.25 MG PO TABS: 0.2500 mg | ORAL_TABLET | Freq: Two times a day (BID) | ORAL | 3 refills | Status: DC | PRN

## 2021-09-14 NOTE — Assessment & Plan Note (Signed)
Recovering w/post-COVID fatiue

## 2021-09-14 NOTE — Assessment & Plan Note (Signed)
On Vit D 

## 2021-09-14 NOTE — Assessment & Plan Note (Signed)
Check TSH, FT4 

## 2021-09-14 NOTE — Progress Notes (Signed)
Subjective:  Patient ID: Vanessa [name], female    DOB: December 09, 1960  Age: 61 y.o. MRN: [id number]  CC: Follow-up (Would like to check B12)   HPI PUJA CAFFEY presents for COVID-19 in May 2023 F/u anxiety, hypothyroidism C/o fatigue post-COVID 50%, no "brain fog"  Outpatient Medications Prior to Visit  Medication Sig Dispense Refill   levothyroxine (SYNTHROID) 88 MCG tablet Take 1 tablet by mouth once daily 90 tablet 3   VITAMIN D PO Take by mouth.     ALPRAZolam (XANAX) 0.25 MG tablet Take 1 tablet (0.25 mg total) by mouth 2 (two) times daily as needed. 60 tablet 1   ALPRAZolam (XANAX) 0.25 MG tablet Take 1 tablet by mouth twice daily as needed (Patient not taking: Reported on 09/14/2021) 60 tablet 0   fluconazole (DIFLUCAN) 150 MG tablet Take first tablet on the third day of cefdinir.  Take second tablet 3 days after first tablet.  Take third tablet 3 days after second tablet. (Patient not taking: Reported on 09/14/2021) 3 tablet 0   ipratropium (ATROVENT) 0.06 % nasal spray Place 2 sprays into both nostrils 3 (three) times daily. As needed for nasal congestion, runny nose (Patient not taking: Reported on 09/14/2021) 15 mL 1   No facility-administered medications prior to visit.    ROS: Review of Systems  Constitutional:  Positive for fatigue. Negative for activity change, appetite change, chills and unexpected weight change.  HENT:  Negative for congestion, mouth sores and sinus pressure.   Eyes:  Negative for visual disturbance.  Respiratory:  Negative for cough and chest tightness.   Gastrointestinal:  Negative for abdominal pain and nausea.  Genitourinary:  Negative for difficulty urinating, frequency and vaginal pain.  Musculoskeletal:  Negative for back pain and gait problem.  Skin:  Negative for pallor and rash.  Neurological:  Negative for dizziness, tremors, weakness, numbness and headaches.  Psychiatric/Behavioral:  Negative for confusion and sleep disturbance.      Objective:  BP 124/82 (BP Location: Left Arm, Patient Position: Sitting, Cuff Size: Large)   Pulse 60   Temp 98 F (36.7 C)   Resp 18   Ht '5\' 2"'$  (1.575 m)   Wt 184 lb 3.2 oz (83.6 kg)   LMP 03/08/2006   SpO2 95%   BMI 33.69 kg/m   BP Readings from Last 3 Encounters:  09/14/21 124/82  09/02/21 126/82  08/22/21 (!) 156/99    Wt Readings from Last 3 Encounters:  09/14/21 184 lb 3.2 oz (83.6 kg)  09/02/21 176 lb (79.8 kg)  04/21/21 176 lb (79.8 kg)    Physical Exam Constitutional:      General: She is not in acute distress.    Appearance: She is well-developed. She is obese.  HENT:     Head: Normocephalic.     Right Ear: External ear normal.     Left Ear: External ear normal.     Nose: Nose normal.  Eyes:     General:        Right eye: No discharge.        Left eye: No discharge.     Conjunctiva/sclera: Conjunctivae normal.     Pupils: Pupils are equal, round, and reactive to light.  Neck:     Thyroid: No thyromegaly.     Vascular: No JVD.     Trachea: No tracheal deviation.  Cardiovascular:     Rate and Rhythm: Normal rate and regular rhythm.     Heart sounds: Normal heart sounds.  Pulmonary:     Effort: No respiratory distress.     Breath sounds: No stridor. No wheezing.  Abdominal:     General: Bowel sounds are normal. There is no distension.     Palpations: Abdomen is soft. There is no mass.     Tenderness: There is no abdominal tenderness. There is no guarding or rebound.  Musculoskeletal:        General: No tenderness.     Cervical back: Normal range of motion and neck supple. No rigidity.  Lymphadenopathy:     Cervical: No cervical adenopathy.  Skin:    Findings: No erythema or rash.  Neurological:     Cranial Nerves: No cranial nerve deficit.     Motor: No abnormal muscle tone.     Coordination: Coordination normal.     Deep Tendon Reflexes: Reflexes normal.  Psychiatric:        Behavior: Behavior normal.        Thought Content: Thought  content normal.        Judgment: Judgment normal.     Lab Results  Component Value Date   WBC 12.4 (H) 08/13/2020   HGB 14.7 08/13/2020   HCT 45.0 08/13/2020   PLT 343.0 03/18/2020   GLUCOSE 87 02/04/2021   CHOL 302 (H) 02/04/2021   TRIG 124 02/04/2021   HDL 47 02/04/2021   LDLDIRECT 199.0 10/15/2018   LDLCALC 232 (H) 02/04/2021   ALT 15 02/04/2021   AST 18 02/04/2021   NA 139 02/04/2021   K 4.5 02/04/2021   CL 101 02/04/2021   CREATININE 1.00 02/04/2021   BUN 23 02/04/2021   CO2 19 (L) 02/04/2021   TSH 0.923 02/04/2021   HGBA1C 5.7 (H) 02/04/2021   MICROALBUR 39.7 (H) 05/14/2020    No results found.  Assessment & Plan:   Problem List Items Addressed This Visit     B12 deficiency    Check B12        History of COVID-19    Recovering w/post-COVID fatiue      Relevant Orders   DG Chest 2 View   Hypertension, renal disease, stage 1-4 or unspecified chronic kidney disease    Check CMET      Hypothyroidism    Check TSH, FT4      Relevant Orders   T4, free   TSH   Post-COVID chronic fatigue    Recovering w/post-COVID fatiue      Relevant Orders   DG Chest 2 View   CBC with Differential/Platelet   Comprehensive metabolic panel   T4, free   TSH   Vitamin D deficiency    On Vit D         Meds ordered this encounter  Medications   ALPRAZolam (XANAX) 0.25 MG tablet    Sig: Take 1 tablet (0.25 mg total) by mouth 2 (two) times daily as needed.    Dispense:  60 tablet    Refill:  3      Follow-up: Return in about 3 months (around 12/15/2021) for a follow-up visit.  Walker Kehr, MD

## 2021-09-14 NOTE — Addendum Note (Signed)
Addended by: Marijean Heath R on: 09/14/2021 04:47 PM   Modules accepted: Orders

## 2021-09-14 NOTE — Assessment & Plan Note (Signed)
Check CMET. 

## 2021-09-14 NOTE — Assessment & Plan Note (Signed)
Check B12 

## 2021-09-15 ENCOUNTER — Ambulatory Visit: Payer: BC Managed Care – PPO

## 2021-09-16 ENCOUNTER — Encounter: Payer: Self-pay | Admitting: Internal Medicine

## 2021-10-18 ENCOUNTER — Ambulatory Visit (INDEPENDENT_AMBULATORY_CARE_PROVIDER_SITE_OTHER): Payer: BC Managed Care – PPO

## 2021-10-18 DIAGNOSIS — E538 Deficiency of other specified B group vitamins: Secondary | ICD-10-CM

## 2021-10-18 MED ORDER — CYANOCOBALAMIN 1000 MCG/ML IJ SOLN
1000.0000 ug | Freq: Once | INTRAMUSCULAR | Status: AC
  Administered 2021-10-18: 1000 ug via INTRAMUSCULAR

## 2021-10-18 NOTE — Progress Notes (Addendum)
After obtaining consent, and per orders of Dr. Alain Marion, injection of B12 given by Max Sane. Patient tolerated injection well in left deltoid and instructed to report any adverse reaction to me immediately.  Medical screening examination/treatment/procedure(s) were performed by non-physician practitioner and as supervising physician I was immediately available for consultation/collaboration.  I agree with above. Lew Dawes, MD

## 2021-11-03 ENCOUNTER — Encounter (INDEPENDENT_AMBULATORY_CARE_PROVIDER_SITE_OTHER): Payer: Self-pay

## 2021-11-03 ENCOUNTER — Encounter: Payer: Self-pay | Admitting: Internal Medicine

## 2021-11-05 ENCOUNTER — Ambulatory Visit: Payer: BC Managed Care – PPO | Admitting: Family Medicine

## 2021-11-05 ENCOUNTER — Encounter: Payer: Self-pay | Admitting: Family Medicine

## 2021-11-05 VITALS — BP 150/90 | HR 72 | Temp 97.6°F | Ht 62.0 in | Wt 178.0 lb

## 2021-11-05 DIAGNOSIS — H6982 Other specified disorders of Eustachian tube, left ear: Secondary | ICD-10-CM

## 2021-11-05 DIAGNOSIS — J0140 Acute pansinusitis, unspecified: Secondary | ICD-10-CM | POA: Diagnosis not present

## 2021-11-05 MED ORDER — AMOXICILLIN-POT CLAVULANATE 875-125 MG PO TABS: 1.0000 | ORAL_TABLET | Freq: Two times a day (BID) | ORAL | 0 refills | Status: DC

## 2021-11-05 MED ORDER — FLUCONAZOLE 150 MG PO TABS
150.0000 mg | ORAL_TABLET | Freq: Once | ORAL | 0 refills | Status: AC
Start: 2021-11-05 — End: 2021-11-05

## 2021-11-05 NOTE — Progress Notes (Signed)
Subjective:  Vanessa [name] is a 61 y.o. female who presents for possible sinus infection.  Symptoms include a 6 day history of headache behind her left eye, nasal congestion, eyes watery, left ear pain, sore throat, post nasal drainage, and mild cough.   Denies fever, chills, dizziness, chest pain, palpitations, shortness of breath, abdominal pain, N/V/D.   Taking Mucinex, Zyrtec, Flonase, eye drops and Tylenol.  She missed work this week   Past history is significant for no history of pneumonia or bronchitis. Patient is a smoker. Denies sick contacts.  No other aggravating or relieving factors.  No other c/o.  ROS as in subjective  Past Medical History:  Diagnosis Date   Allergy    rhinitis   Anxiety    B12 deficiency    Cyst    gangeous- left hand   Depression    Hyperlipidemia    Hypothyroidism    Hypothyroidism    Kidney problem    Tobacco abuse    Vitamin D deficiency 11/2017   Value 21      Objective: Vitals:   11/05/21 0847  BP: (!) 150/90  Pulse: 72  Temp: 97.6 F (36.4 C)  SpO2: 99%    General appearance: Alert, WD/WN, no distress                             Skin: warm, no rash                           Head: + left maxillary sinus tenderness,                            Eyes: conjunctiva normal, corneas clear, PERRLA                            Ears: red, dull left TM, normal right TM, external ear canals normal                          Nose: septum midline, turbinates swollen, with erythema and clear discharge             Mouth/throat: MMM, tongue normal, mild pharyngeal erythema                           Neck: supple, no adenopathy, no thyromegaly, nontender                          Heart: RRR, normal S1, S2, no murmurs                         Lungs: CTA bilaterally, no wheezes, rales, or rhonchi       Assessment and Plan:  Acute non-recurrent pansinusitis - Plan: amoxicillin-clavulanate (AUGMENTIN) 875-125 MG tablet  Eustachian tube dysfunction,  left - Plan: amoxicillin-clavulanate (AUGMENTIN) 875-125 MG tablet   Prescription for Augmentin and Diflucan (yeast infections with abx). Can use OTC Mucinex .Flonase for congestion.  Tylenol or Ibuprofen OTC.   Discussed symptomatic relief, nasal saline flush, and call or return if worse or not back to baseline in 10 days.

## 2021-11-05 NOTE — Patient Instructions (Addendum)
Take the antibiotic as prescribed.  Continue drinking plenty of water.  Use Flonase for the next week.  Continue taking Zyrtec, Mucinex and Tylenol or ibuprofen as needed.  Follow-up if you are getting worse or not back to baseline when you complete the antibiotic.

## 2021-11-08 ENCOUNTER — Ambulatory Visit: Payer: BC Managed Care – PPO | Admitting: Emergency Medicine

## 2021-11-11 IMAGING — US US RENAL
1 series · 14 of 25 positions shown · non-contrast
Comparison: None.

CLINICAL DATA: Decreasing GFR

EXAM:
RENAL / URINARY TRACT ULTRASOUND COMPLETE

[Series 1: us renal · 0.23mm/px · 14 of 37 slices shown]
[im 1/37]
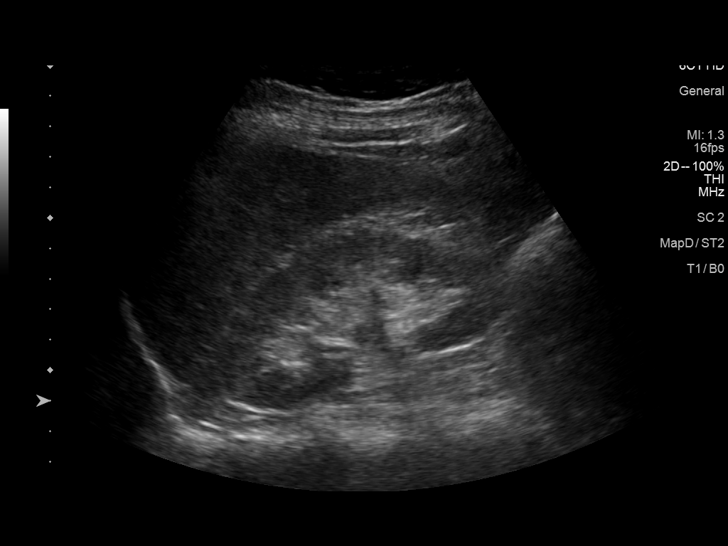
[im 4/37]
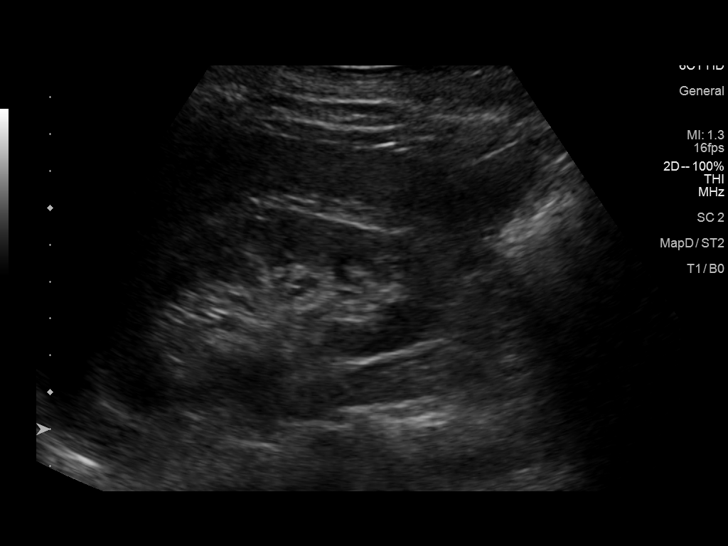
[im 7/37]
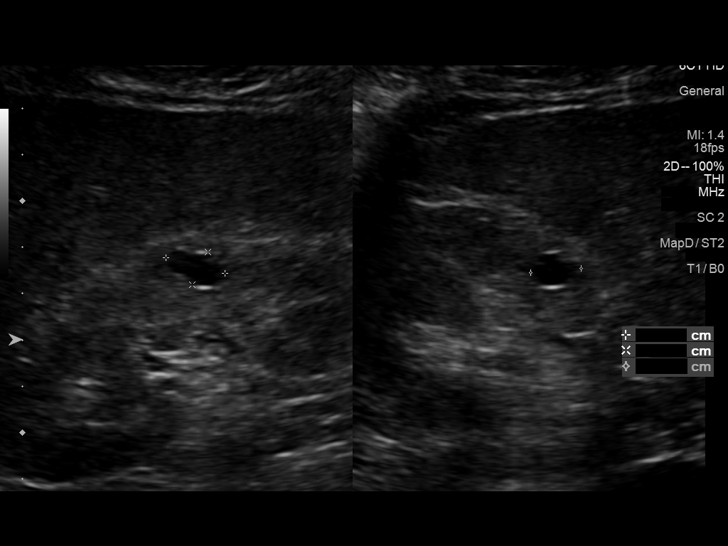
[im 10/37]
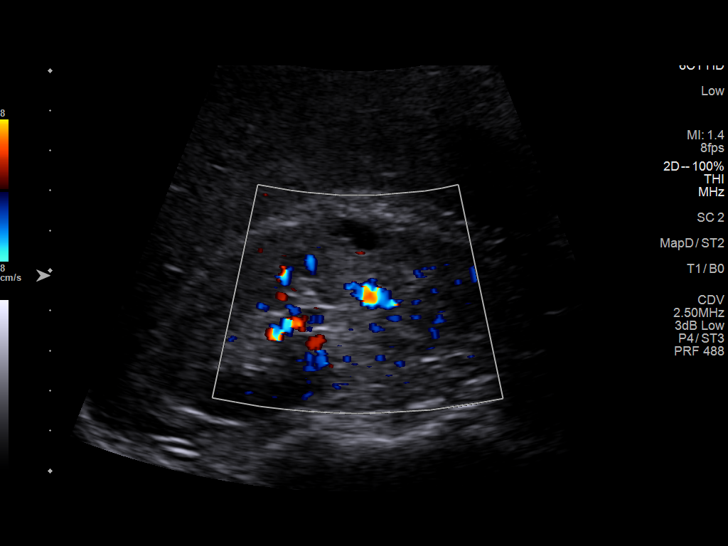
[im 13/37]
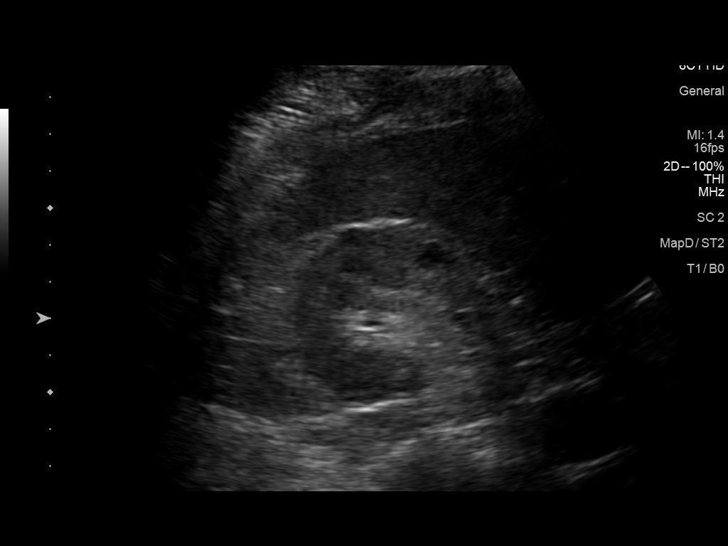
[im 14/37]
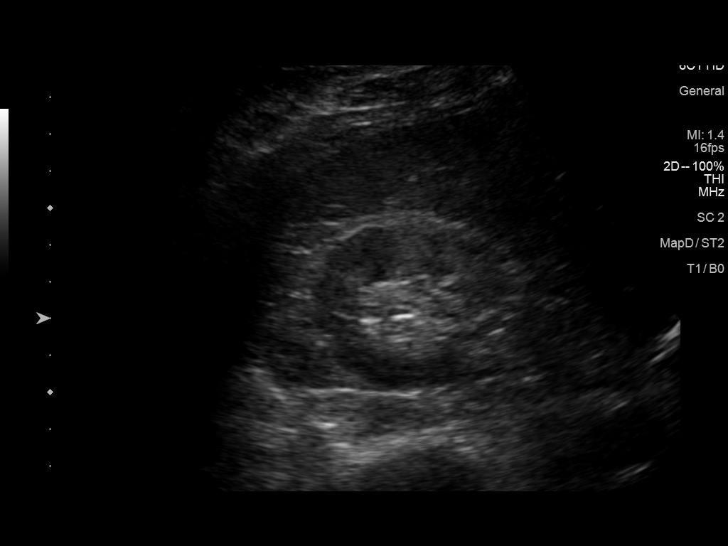
[im 17/37]
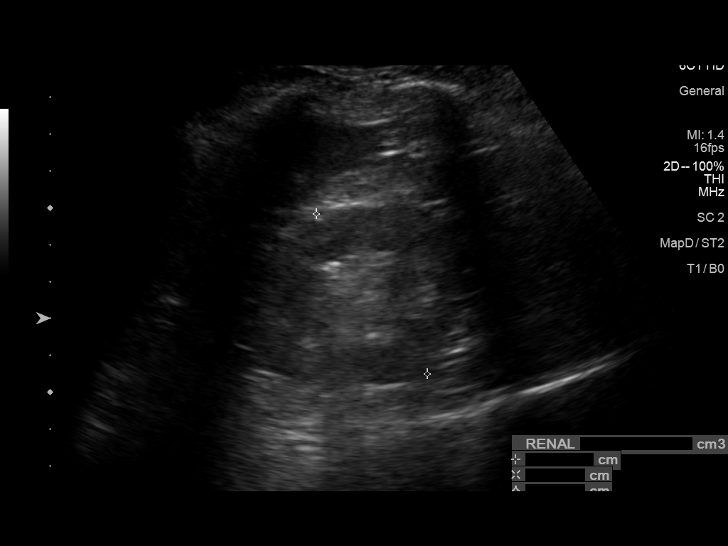
[im 20/37]
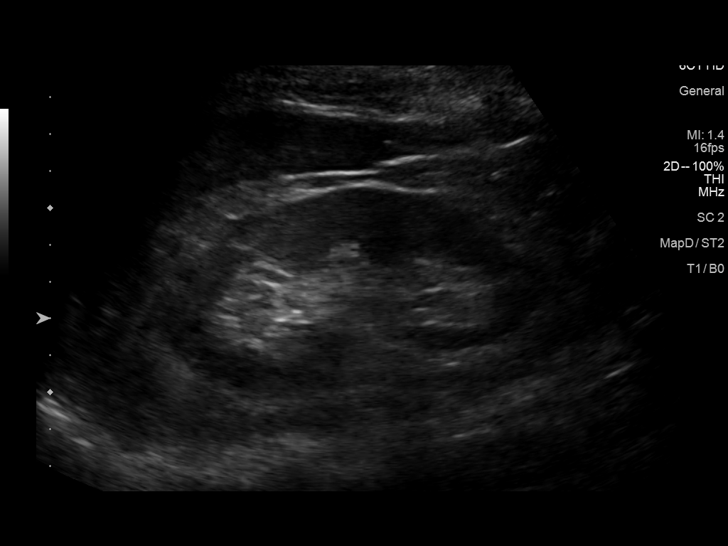
[im 23/37]
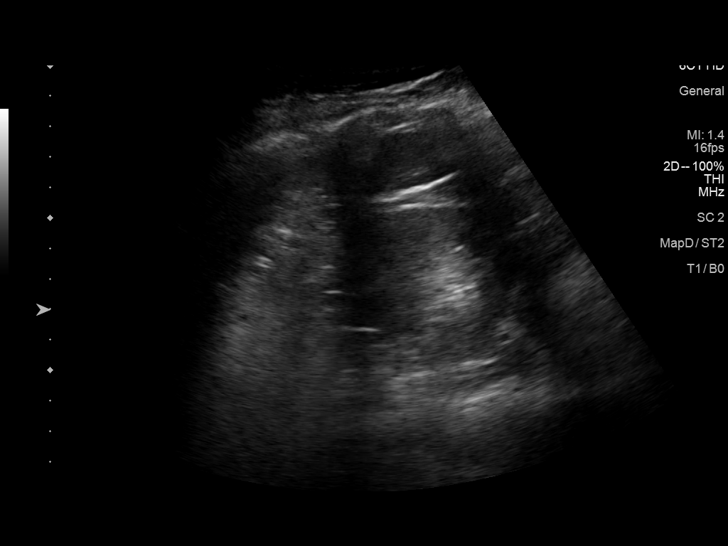
[im 25/37]
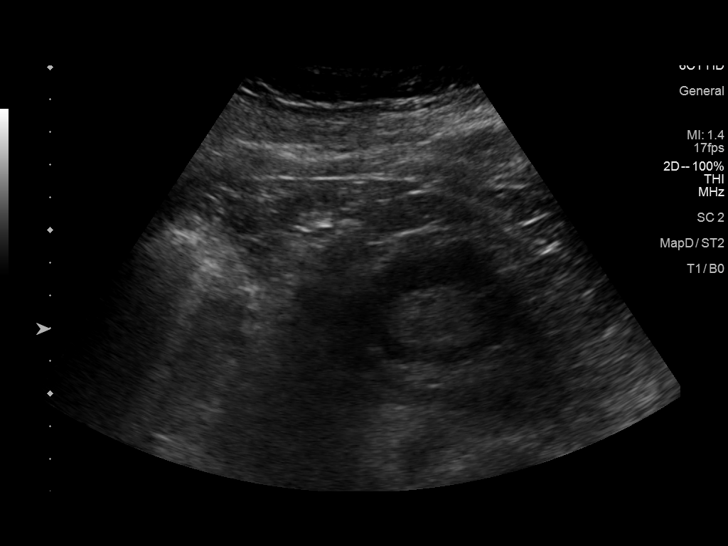
[im 28/37]
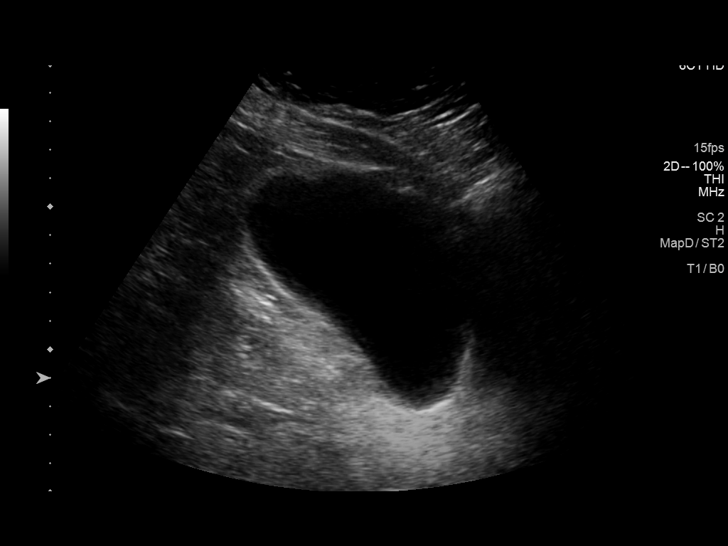
[im 31/37]
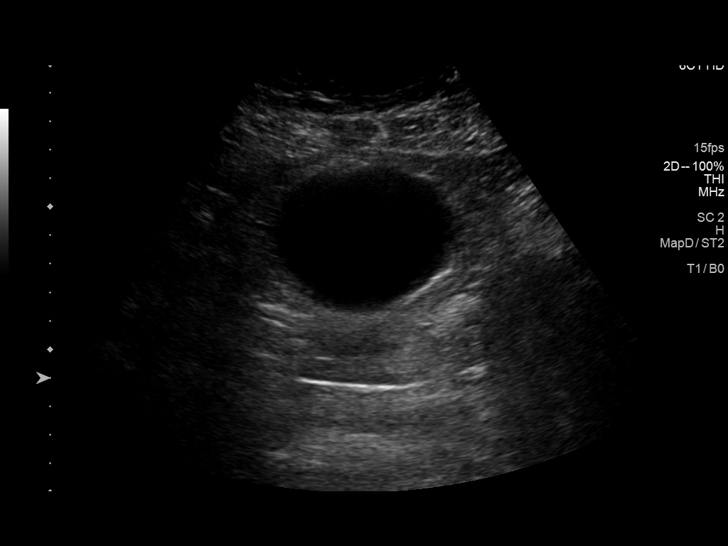
[im 34/37]
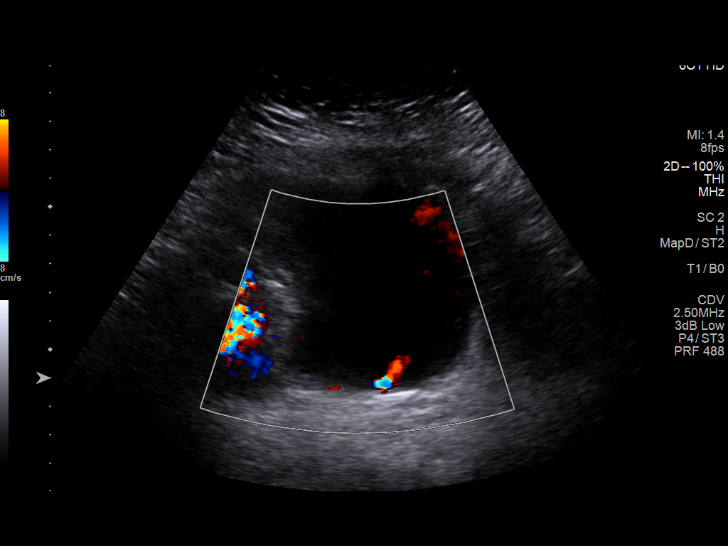
[im 37/37]
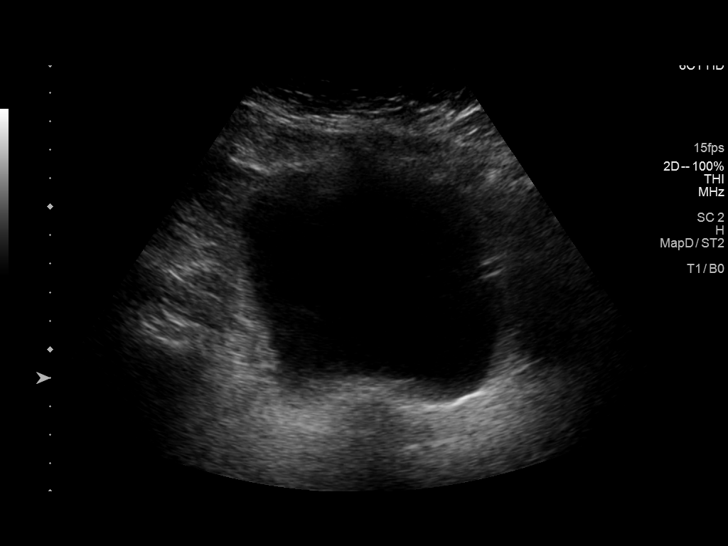

[14 of 25 positions shown; findings below may reference images not displayed]

FINDINGS: Right Kidney:

Renal measurements: 10.2 x 5.3 x 5.2 cm = volume: 145 mL. Diffusely
increased echogenicity. No hydronephrosis. 1.3 x 1.1 x 0.8 cm
interpolar renal cyst.

Left Kidney:

Renal measurements: 10.8 x 5.3 x 4.6 cm = volume: 136 mL. Increased
echogenicity. No mass or hydronephrosis visualized.

Bladder:

Appears normal for degree of bladder distention. Bilateral ureteral
jets are visualized.

Other:

None.
IMPRESSION: 1. No hydronephrosis.
2. Diffusely increased bilateral renal echogenicity, as can be seen
with medical renal disease.

## 2021-11-25 ENCOUNTER — Ambulatory Visit (INDEPENDENT_AMBULATORY_CARE_PROVIDER_SITE_OTHER): Payer: BC Managed Care – PPO | Admitting: Family Medicine

## 2021-11-25 ENCOUNTER — Ambulatory Visit: Payer: Self-pay

## 2021-11-25 ENCOUNTER — Ambulatory Visit (INDEPENDENT_AMBULATORY_CARE_PROVIDER_SITE_OTHER): Payer: BC Managed Care – PPO

## 2021-11-25 ENCOUNTER — Ambulatory Visit: Payer: BC Managed Care – PPO

## 2021-11-25 VITALS — BP 134/90 | HR 85 | Ht 62.0 in | Wt 183.0 lb

## 2021-11-25 DIAGNOSIS — E538 Deficiency of other specified B group vitamins: Secondary | ICD-10-CM

## 2021-11-25 DIAGNOSIS — M6530 Trigger finger, unspecified finger: Secondary | ICD-10-CM | POA: Diagnosis not present

## 2021-11-25 DIAGNOSIS — M79642 Pain in left hand: Secondary | ICD-10-CM

## 2021-11-25 MED ORDER — CYANOCOBALAMIN 1000 MCG/ML IJ SOLN
1000.0000 ug | Freq: Once | INTRAMUSCULAR | Status: AC
  Administered 2021-11-25: 1000 ug via INTRAMUSCULAR

## 2021-11-25 NOTE — Progress Notes (Cosign Needed Addendum)
Patient here for monthly B12 injection per Dr. Alain Marion. B12 1000 mcg given in left IM and patient tolerated injection well today.   Medical screening examination/treatment/procedure(s) were performed by non-physician practitioner and as supervising physician I was immediately available for consultation/collaboration.  I agree with above. Lew Dawes, MD

## 2021-11-25 NOTE — Patient Instructions (Addendum)
Injected finger today Splint with bandaids for trigger finger for a few weeks.  See me again as needed.

## 2021-11-25 NOTE — Progress Notes (Signed)
   I, Peterson Lombard, LAT, ATC acting as a scribe for Lynne Leader, MD.  Subjective:    CC: L hand pain  HPI: Pt is a 61 y/o female c/o L hand pain. Pt was previously seen by Dr. Tamala Julian on 12/07/18 for bilat heel pain. Today, pt c/o L hand pain middle finger for past month. Pt locates pain to her L 3rd finger w/ triggering. 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 Using Tylenol prn. History of trigger thumb.   L hand swelling: Grip strength: Numbness/tingling: Aggravates: Treatments tried:  Pertinent review of Systems: No fevers or chills  Relevant historical information: Hypothyroidism.  Prior trigger thumb in the past.   Objective:    Vitals:   11/25/21 1444  BP: (!) 134/90  Pulse: 85  SpO2: 98%   General: Well Developed, well nourished, and in no acute distress.   MSK: Left hand: Normal-appearing Triggering present with flexion of left third digit at PIP.  Tender palpation left palmar MCP.  Strength is intact.  Pulses capillary refill and sensation are intact distally.  Lab and Radiology Results  Procedure: Real-time Ultrasound Guided Injection of left third flexor tendon sheath at A1 pulley (trigger finger injection) Device: Philips Affiniti 50G Images permanently stored and available for review in PACS Verbal informed consent obtained.  Discussed risks and benefits of procedure. Warned about infection, bleeding, hyperglycemia damage to structures among others. Patient expresses understanding and agreement Time-out conducted.   Noted no overlying erythema, induration, or other signs of local infection.   Skin prepped in a sterile fashion.   Local anesthesia: Topical Ethyl chloride.   With sterile technique and under real time ultrasound guidance: 40 mg of Kenalog and 1 mL of lidocaine injected into tendon sheath at A1 pulley. Fluid seen entering the tendon sheath.   Completed without difficulty   Pain immediately resolved suggesting  accurate placement of the medication.   Advised to call if fevers/chills, erythema, induration, drainage, or persistent bleeding.   Images permanently stored and available for review in the ultrasound unit.  Impression: Technically successful ultrasound guided injection.       Impression and Recommendations:    Assessment and Plan: 60 y.o. female with left trigger finger third digit.  Plan for injection today.  Also plan for double Band-Aid splint.  Recheck as needed.Marland Kitchen  PDMP not reviewed this encounter. Orders Placed This Encounter  Procedures   [place] LIMITED JOINT SPACE STRUCTURES UP LEFT(NO LINKED CHARGES)    Order Specific Question:   Reason for Exam (SYMPTOM  OR DIAGNOSIS REQUIRED)    Answer:   left hand pain    Order Specific Question:   Preferred imaging location?    Answer:   [place]   No orders of the defined types were placed in this encounter.   Discussed warning signs or symptoms. Please see discharge instructions. Patient expresses understanding.   The above documentation has been reviewed and is accurate and complete Lynne Leader, M.D.

## 2021-12-29 ENCOUNTER — Encounter: Payer: Self-pay | Admitting: Internal Medicine

## 2021-12-29 ENCOUNTER — Ambulatory Visit (INDEPENDENT_AMBULATORY_CARE_PROVIDER_SITE_OTHER): Payer: BC Managed Care – PPO

## 2021-12-29 DIAGNOSIS — E538 Deficiency of other specified B group vitamins: Secondary | ICD-10-CM

## 2021-12-29 MED ORDER — CYANOCOBALAMIN 1000 MCG/ML IJ SOLN
1000.0000 ug | Freq: Once | INTRAMUSCULAR | Status: AC
  Administered 2021-12-29: 1000 ug via INTRAMUSCULAR

## 2021-12-29 NOTE — Progress Notes (Addendum)
After obtaining consent, and per orders of Dr. Plotnikov, injection of B12 was given in the right deltoid by Keniesha Adderly P [place] Wins[place]. Patient instructed to report any adverse reaction to me immediately.   Medical screening examination/treatment/procedure(s) were performed by non-physician practitioner and as supervising physician I was immediately available for consultation/collaboration.  I agree with above. Aleksei Plotnikov, MD 

## 2022-01-13 LAB — BASIC METABOLIC PANEL
BUN: 27 — AB (ref 4–21)
CO2: 24 — AB (ref 13–22)
Chloride: 103 (ref 99–108)
Creatinine: 1.1 (ref 0.5–1.1)
Glucose: 83
Potassium: 4 mEq/L (ref 3.5–5.1)
Sodium: 139 (ref 137–147)

## 2022-01-13 LAB — COMPREHENSIVE METABOLIC PANEL
Albumin: 4.2 (ref 3.5–5.0)
Calcium: 9 (ref 8.7–10.7)
eGFR: 56

## 2022-01-13 LAB — PROTEIN / CREATININE RATIO, URINE: Creatinine, Urine: 124.4

## 2022-01-24 ENCOUNTER — Encounter: Payer: Self-pay | Admitting: Internal Medicine

## 2022-02-01 ENCOUNTER — Encounter: Payer: Self-pay | Admitting: Internal Medicine

## 2022-02-01 ENCOUNTER — Ambulatory Visit: Payer: BC Managed Care – PPO | Admitting: Internal Medicine

## 2022-02-01 VITALS — BP 130/72 | HR 73 | Temp 97.7°F | Ht 62.0 in | Wt 183.4 lb

## 2022-02-01 DIAGNOSIS — E785 Hyperlipidemia, unspecified: Secondary | ICD-10-CM | POA: Diagnosis not present

## 2022-02-01 DIAGNOSIS — I129 Hypertensive chronic kidney disease with stage 1 through stage 4 chronic kidney disease, or unspecified chronic kidney disease: Secondary | ICD-10-CM

## 2022-02-01 DIAGNOSIS — E669 Obesity, unspecified: Secondary | ICD-10-CM | POA: Diagnosis not present

## 2022-02-01 DIAGNOSIS — Z6832 Body mass index (BMI) 32.0-32.9, adult: Secondary | ICD-10-CM

## 2022-02-01 DIAGNOSIS — E538 Deficiency of other specified B group vitamins: Secondary | ICD-10-CM

## 2022-02-01 MED ORDER — CYANOCOBALAMIN 1000 MCG/ML IJ SOLN
1000.0000 ug | Freq: Once | INTRAMUSCULAR | Status: AC
  Administered 2022-02-01: 1000 ug via INTRAMUSCULAR

## 2022-02-01 MED ORDER — PITAVASTATIN CALCIUM 2 MG PO TABS: 1.0000 | ORAL_TABLET | Freq: Every day | ORAL | 11 refills | Status: DC

## 2022-02-01 MED ORDER — ALPRAZOLAM 0.25 MG PO TABS: 0.2500 mg | ORAL_TABLET | Freq: Two times a day (BID) | ORAL | 3 refills | Status: DC | PRN

## 2022-02-01 NOTE — Patient Instructions (Signed)

## 2022-02-01 NOTE — Addendum Note (Signed)
Addended by: Earnstine Regal on: 02/01/2022 04:18 PM   Modules accepted: Orders

## 2022-02-01 NOTE — Assessment & Plan Note (Addendum)
Chronic Crestor d/c Statin intolerant A cardiac CT scan for coronary calcium offered   - pt declined Try Livalo

## 2022-02-01 NOTE — Progress Notes (Signed)
Subjective:  Patient ID: Vanessa [name], female    DOB: 1960-09-28  Age: 61 y.o. MRN: [id number]  CC: Follow-up (4 MONTH F/U)   HPI ESTEPHANY PEROT presents for CRI, anxiety, hypothyroidism  Outpatient Medications Prior to Visit  Medication Sig Dispense Refill   levothyroxine (SYNTHROID) 88 MCG tablet Take 1 tablet by mouth once daily 90 tablet 3   VITAMIN D PO Take by mouth.     ALPRAZolam (XANAX) 0.25 MG tablet Take 1 tablet (0.25 mg total) by mouth 2 (two) times daily as needed. 60 tablet 3   ALPRAZolam (XANAX) 0.25 MG tablet Take 1 tablet by mouth twice daily as needed (Patient not taking: Reported on 02/01/2022) 60 tablet 0   amoxicillin-clavulanate (AUGMENTIN) 875-125 MG tablet Take 1 tablet by mouth 2 (two) times daily. (Patient not taking: Reported on 02/01/2022) 20 tablet 0   No facility-administered medications prior to visit.    ROS: Review of Systems  Constitutional:  Negative for activity change, appetite change, chills, fatigue and unexpected weight change.  HENT:  Negative for congestion, mouth sores and sinus pressure.   Eyes:  Negative for visual disturbance.  Respiratory:  Negative for cough and chest tightness.   Gastrointestinal:  Negative for abdominal pain and nausea.  Genitourinary:  Negative for difficulty urinating, frequency and vaginal pain.  Musculoskeletal:  Negative for back pain and gait problem.  Skin:  Negative for pallor and rash.  Neurological:  Negative for dizziness, tremors, weakness, numbness and headaches.  Psychiatric/Behavioral:  Negative for confusion and sleep disturbance. The patient is nervous/anxious.     Objective:  BP 130/72 (BP Location: Left Arm)   Pulse 73   Temp 97.7 F (36.5 C) (Oral)   Ht '5\' 2"'$  (1.575 m)   Wt 183 lb 7.2 oz (83.2 kg)   LMP 03/08/2006   SpO2 97%   BMI 33.55 kg/m   BP Readings from Last 3 Encounters:  02/01/22 130/72  11/25/21 (!) 134/90  11/05/21 (!) 150/90    Wt Readings from Last 3  Encounters:  02/01/22 183 lb 7.2 oz (83.2 kg)  11/25/21 183 lb (83 kg)  11/05/21 178 lb (80.7 kg)    Physical Exam Constitutional:      General: She is not in acute distress.    Appearance: She is well-developed. She is obese.  HENT:     Head: Normocephalic.     Right Ear: External ear normal.     Left Ear: External ear normal.     Nose: Nose normal.  Eyes:     General:        Right eye: No discharge.        Left eye: No discharge.     Conjunctiva/sclera: Conjunctivae normal.     Pupils: Pupils are equal, round, and reactive to light.  Neck:     Thyroid: No thyromegaly.     Vascular: No JVD.     Trachea: No tracheal deviation.  Cardiovascular:     Rate and Rhythm: Normal rate and regular rhythm.     Heart sounds: Normal heart sounds.  Pulmonary:     Effort: No respiratory distress.     Breath sounds: No stridor. No wheezing.  Abdominal:     General: Bowel sounds are normal. There is no distension.     Palpations: Abdomen is soft. There is no mass.     Tenderness: There is no abdominal tenderness. There is no guarding or rebound.  Musculoskeletal:  General: No tenderness.     Cervical back: Normal range of motion and neck supple. No rigidity.  Lymphadenopathy:     Cervical: No cervical adenopathy.  Skin:    Findings: No erythema or rash.  Neurological:     Cranial Nerves: No cranial nerve deficit.     Motor: No abnormal muscle tone.     Coordination: Coordination normal.     Deep Tendon Reflexes: Reflexes normal.  Psychiatric:        Behavior: Behavior normal.        Thought Content: Thought content normal.        Judgment: Judgment normal.     Lab Results  Component Value Date   WBC 11.3 (H) 09/14/2021   HGB 13.8 09/14/2021   HCT 41.8 09/14/2021   PLT 306.0 09/14/2021   GLUCOSE 78 09/14/2021   CHOL 302 (H) 02/04/2021   TRIG 124 02/04/2021   HDL 47 02/04/2021   LDLDIRECT 199.0 10/15/2018   LDLCALC 232 (H) 02/04/2021   ALT 17 09/14/2021   AST 19  09/14/2021   NA 139 01/13/2022   K 4.0 01/13/2022   CL 103 01/13/2022   CREATININE 1.1 01/13/2022   BUN 27 (A) 01/13/2022   CO2 24 (A) 01/13/2022   TSH 5.67 (H) 09/14/2021   HGBA1C 5.7 (H) 02/04/2021   MICROALBUR 39.7 (H) 05/14/2020    No results found.  Assessment & Plan:   Problem List Items Addressed This Visit     B12 deficiency    On B12 inj      Dyslipidemia - Primary    Chronic Crestor d/c Statin intolerant A cardiac CT scan for coronary calcium offered   - pt declined Try Livalo      Relevant Medications   Pitavastatin Calcium 2 MG TABS   Other Relevant Orders   Comprehensive metabolic panel   Lipid panel   Urinalysis   Hypertension, renal disease, stage 1-4 or unspecified chronic kidney disease    CKD st 3 - seeing Dr Donnelly Angelica well      Class 1 obesity with serious comorbidity and body mass index (BMI) of 32.0 to 32.9 in adult    [place] options discussed         Meds ordered this encounter  Medications   Pitavastatin Calcium 2 MG TABS    Sig: Take 1 tablet (2 mg total) by mouth daily.    Dispense:  30 tablet    Refill:  11    Statin intolerant   ALPRAZolam (XANAX) 0.25 MG tablet    Sig: Take 1 tablet (0.25 mg total) by mouth 2 (two) times daily as needed.    Dispense:  60 tablet    Refill:  3      Follow-up: Return in about 3 months (around 05/04/2022) for a follow-up visit.  Walker Kehr, MD

## 2022-02-01 NOTE — Progress Notes (Addendum)
Pls cosign for B12 inj../lmb   Medical screening examination/treatment/procedure(s) were performed by non-physician practitioner and as supervising physician I was immediately available for consultation/collaboration.  I agree with above. Aleksei Plotnikov, MD  

## 2022-02-01 NOTE — Assessment & Plan Note (Signed)
On B12 inj 

## 2022-02-01 NOTE — Assessment & Plan Note (Signed)
Mancel Parsons options discussed

## 2022-02-01 NOTE — Assessment & Plan Note (Signed)
CKD st 3 - seeing Dr Donnelly Angelica well

## 2022-02-02 ENCOUNTER — Ambulatory Visit: Payer: BC Managed Care – PPO

## 2022-02-09 ENCOUNTER — Telehealth: Payer: Self-pay | Admitting: Internal Medicine

## 2022-02-09 NOTE — Telephone Encounter (Signed)
Patient called and said she talked to the pharmacy and said that they told her that the cholesterol medication sent in wasn't covered by insurance and said it was going to be $300. She was told to call to see what else could be sent in instead that would be covered. Please advise

## 2022-02-10 ENCOUNTER — Other Ambulatory Visit: Payer: Self-pay | Admitting: Internal Medicine

## 2022-02-10 MED ORDER — PITAVASTATIN CALCIUM 2 MG PO TABS: 1.0000 | ORAL_TABLET | Freq: Every day | ORAL | 11 refills | Status: DC

## 2022-02-10 NOTE — Telephone Encounter (Signed)
Called pt no answer LMOM w/ MD response, along w/ pharmacy phone #[email or web address]

## 2022-02-10 NOTE — Telephone Encounter (Signed)
As I told Zimal, I will send this prescription to [facility] where she should be able to get it for $30 a month.  Thanks

## 2022-02-10 NOTE — Telephone Encounter (Signed)
Rec'd msg from [facility] stating " DO NOT CARRY LIVALO PLS CHANGE TO ZYPITAMAG.Marland Kitchen/LMB

## 2022-02-10 NOTE — Telephone Encounter (Signed)
Rec;d msg stating drug not covered. insurance prefers pravastin and simvastatin. ..Vanessa [name]

## 2022-02-14 ENCOUNTER — Other Ambulatory Visit: Payer: Self-pay | Admitting: Internal Medicine

## 2022-02-14 MED ORDER — ZYPITAMAG 2 MG PO TABS: 2.0000 mg | ORAL_TABLET | Freq: Every day | ORAL | 11 refills | Status: DC

## 2022-02-15 ENCOUNTER — Ambulatory Visit: Payer: BC Managed Care – PPO | Admitting: Internal Medicine

## 2022-02-21 ENCOUNTER — Other Ambulatory Visit: Payer: Self-pay | Admitting: Internal Medicine

## 2022-03-03 ENCOUNTER — Ambulatory Visit: Payer: BC Managed Care – PPO

## 2022-03-07 ENCOUNTER — Ambulatory Visit: Payer: BC Managed Care – PPO

## 2022-03-09 ENCOUNTER — Ambulatory Visit (INDEPENDENT_AMBULATORY_CARE_PROVIDER_SITE_OTHER): Payer: BC Managed Care – PPO

## 2022-03-09 DIAGNOSIS — E538 Deficiency of other specified B group vitamins: Secondary | ICD-10-CM

## 2022-03-09 MED ORDER — CYANOCOBALAMIN 1000 MCG/ML IJ SOLN
1000.0000 ug | Freq: Once | INTRAMUSCULAR | Status: AC
  Administered 2022-03-09: 1000 ug via INTRAMUSCULAR

## 2022-03-09 NOTE — Progress Notes (Addendum)
Pt was given Q91 w/o any complications. Medical screening examination/treatment/procedure(s) were performed by non-physician practitioner and as supervising physician I was immediately available for consultation/collaboration.  I agree with above. Lew Dawes, MD

## 2022-03-15 ENCOUNTER — Encounter: Payer: Self-pay | Admitting: Emergency Medicine

## 2022-03-15 ENCOUNTER — Ambulatory Visit (INDEPENDENT_AMBULATORY_CARE_PROVIDER_SITE_OTHER): Payer: BC Managed Care – PPO | Admitting: Emergency Medicine

## 2022-03-15 VITALS — BP 136/96 | HR 93 | Temp 98.2°F | Ht 62.0 in | Wt 182.2 lb

## 2022-03-15 DIAGNOSIS — R6889 Other general symptoms and signs: Secondary | ICD-10-CM

## 2022-03-15 DIAGNOSIS — R519 Headache, unspecified: Secondary | ICD-10-CM | POA: Diagnosis not present

## 2022-03-15 DIAGNOSIS — R0981 Nasal congestion: Secondary | ICD-10-CM

## 2022-03-15 DIAGNOSIS — J0100 Acute maxillary sinusitis, unspecified: Secondary | ICD-10-CM | POA: Diagnosis not present

## 2022-03-15 DIAGNOSIS — H6691 Otitis media, unspecified, right ear: Secondary | ICD-10-CM | POA: Diagnosis not present

## 2022-03-15 LAB — POC COVID19 BINAXNOW: SARS Coronavirus 2 Ag: NEGATIVE

## 2022-03-15 MED ORDER — AMOXICILLIN-POT CLAVULANATE 875-125 MG PO TABS: 1.0000 | ORAL_TABLET | Freq: Two times a day (BID) | ORAL | 0 refills | Status: AC

## 2022-03-15 MED ORDER — PREDNISONE 20 MG PO TABS: 40.0000 mg | ORAL_TABLET | Freq: Every day | ORAL | 0 refills | Status: AC

## 2022-03-15 NOTE — Assessment & Plan Note (Signed)
Recommend Tylenol and or Advil as needed

## 2022-03-15 NOTE — Assessment & Plan Note (Signed)
Active and creating a lot of symptoms. Will start Augmentin 875 mg twice a day for 7 days Advised to rest and stay well-hydrated

## 2022-03-15 NOTE — Assessment & Plan Note (Signed)
Negative COVID test. Advised to rest and stay well-hydrated Medications as prescribed

## 2022-03-15 NOTE — Patient Instructions (Signed)

## 2022-03-15 NOTE — Assessment & Plan Note (Signed)
Start Augmentin 875 mg twice a day for 7 days Continue Tylenol for pain

## 2022-03-15 NOTE — Assessment & Plan Note (Signed)
Continue over-the-counter Mucinex DM Start using saline nasal sprays frequently during the day Start prednisone 40 mg daily for 5 days May use over-the-counter Sudafed

## 2022-03-15 NOTE — Progress Notes (Signed)
Vanessa [name] QPRFFM 61 y.o.   Chief Complaint  Patient presents with   Acute Visit    Poss sinus infection, sore throat, ear clogged, nasal congestion, cough chest congestion , patient wants Covid test    HISTORY OF PRESENT ILLNESS: Acute problem visit today. This is a 61 y.o. female complaining of flulike symptoms that started last Thursday and progressively getting worse.  Complaining of sore throat, sinus congestion, headache, chest congestion, right ear pain, mild cough.  Coworkers also sick with similar symptoms. No other associated symptoms. No other complaints or medical concerns today.  HPI   Prior to Admission medications   Medication Sig Start Date End Date Taking? Authorizing Provider  ALPRAZolam (XANAX) 0.25 MG tablet Take 1 tablet (0.25 mg total) by mouth 2 (two) times daily as needed. 02/01/22  Yes Vanessa [name], Vanessa Lacks, MD  amoxicillin-clavulanate (AUGMENTIN) 875-125 MG tablet Take 1 tablet by mouth 2 (two) times daily for 7 days. 03/15/22 03/22/22 Yes Vanessa [name], Vanessa Bloomer, MD  levothyroxine (SYNTHROID) 88 MCG tablet Take 1 tablet by mouth once daily 02/21/22  Yes Vanessa [name], Vanessa Lacks, MD  Pitavastatin Calcium 2 MG TABS Take 1 tablet (2 mg total) by mouth daily. 02/10/22  Yes Vanessa [name], Vanessa Lacks, MD  Pitavastatin Magnesium (ZYPITAMAG) 2 MG TABS Take 2 mg by mouth daily. 02/14/22  Yes Vanessa [name], Vanessa Lacks, MD  predniSONE (DELTASONE) 20 MG tablet Take 2 tablets (40 mg total) by mouth daily with breakfast for 5 days. 03/15/22 03/20/22 Yes Vanessa [name], Vanessa Bloomer, MD  VITAMIN D PO Take by mouth.   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   History of COVID-19 09/02/2021   Class 1 obesity with serious comorbidity and body mass index (BMI) of 32.0 to 32.9 in adult 10/28/2020   GERD (gastroesophageal reflux disease) 10/21/2020   Proteinuria 05/19/2020   Renal insufficiency 05/19/2020   Menopause 03/25/2016   Hypertension, renal disease, stage 1-4 or unspecified chronic kidney disease 07/21/2015   Vitamin D deficiency 07/21/2015   Onychomycosis 04/09/2014   Current smoker 06/26/2013   PSORIASIS 02/02/2010   Acute non-recurrent pansinusitis 08/28/2009   B12 deficiency 02/10/2009   Anxiety disorder 08/11/2008   DEPRESSION 08/11/2008   INSOMNIA, PERSISTENT 02/14/2008   FATIGUE 02/14/2008   SWEATING 02/14/2008   TOBACCO USE DISORDER/SMOKER-SMOKING CESSATION DISCUSSED 06/08/2007   Hypothyroidism 03/07/2007   Dyslipidemia 10/27/2006    Past Medical History:  Diagnosis Date   Allergy    rhinitis   Anxiety    B12 deficiency    Cyst    gangeous- left hand   Depression    Hyperlipidemia    Hypothyroidism    Hypothyroidism    Kidney problem    Tobacco abuse    Vitamin D deficiency 11/2017   Value 21    Past Surgical History:  Procedure Laterality Date   [place] CYST EXCISION  2009   right ankle    Social History   Socioeconomic History   Marital status: Married    Spouse name: Not on file  Number of children: Not on file   Years of education: Not on file   Highest education level: Not on file  Occupational History   Occupation: Electrical engineer  Tobacco Use   Smoking status: Every Day    Types: Cigarettes   Smokeless tobacco: Never  Vaping Use   Vaping Use: Never used  Substance and Sexual Activity   Alcohol use: Not Currently    Alcohol/week: 0.0 standard drinks of alcohol   Drug use: No   Sexual activity: Yes    Birth control/protection: Post-menopausal    Comment: 1st intercourse 61 yo-Fewer than 5 partners  Other  Topics Concern   Not on file  Social History Narrative   Not on file   Social Determinants of Health   Financial Resource Strain: Not on file  Food Insecurity: Not on file  Transportation Needs: Not on file  Physical Activity: Not on file  Stress: Not on file  Social Connections: Not on file  Intimate Partner Violence: Not on file    Family History  Problem Relation Age of Onset   Thyroid disease Mother        Low   Other Mother        B12 deficiency   Diabetes Mother    Hyperlipidemia Mother    Heart disease Father 68       CAD   Hyperlipidemia Father    Heart failure Father    Sudden death Father    Coronary artery disease Other    Hyperlipidemia Other    Psoriasis Son    Colon cancer Neg Hx      Review of Systems  Constitutional:  Positive for malaise/fatigue. Negative for chills and fever.  HENT:  Positive for congestion and sore throat.   Respiratory:  Positive for cough.   Cardiovascular:  Negative for chest pain and palpitations.  Gastrointestinal:  Negative for abdominal pain, diarrhea, nausea and vomiting.  Genitourinary: Negative.  Negative for dysuria and hematuria.  Skin: Negative.   Neurological:  Positive for headaches. Negative for dizziness.  All other systems reviewed and are negative.  Today's Vitals   03/15/22 1002  BP: (!) 146/98  Pulse: 93  Temp: 98.2 F (36.8 C)  TempSrc: Oral  SpO2: 96%  Weight: 182 lb 4 oz (82.7 kg)  Height: '5\' 2"'$  (1.575 m)   Body mass index is 33.33 kg/m.   Physical Exam Vitals reviewed.  Constitutional:      Appearance: Normal appearance.  HENT:     Head: Normocephalic.     Left Ear: Tympanic membrane, ear canal and external ear normal.     Ears:     Comments: Hyperemic right tympanic membrane    Nose:     Right Sinus: Maxillary sinus tenderness present.     Left Sinus: Maxillary sinus tenderness present.     Mouth/Throat:     Mouth: Mucous membranes are moist.     Pharynx: Oropharynx is clear.   Eyes:     Extraocular Movements: Extraocular movements intact.     Pupils: Pupils are equal, round, and reactive to light.  Cardiovascular:     Rate and Rhythm: Normal rate and regular rhythm.     Pulses: Normal pulses.     Heart sounds: Normal heart sounds.  Pulmonary:     Effort: Pulmonary effort is normal.     Breath sounds: Normal breath sounds.  Musculoskeletal:     Cervical back: No tenderness.  Lymphadenopathy:     Cervical: No cervical  adenopathy.  Skin:    General: Skin is warm and dry.  Neurological:     General: No focal deficit present.     Mental Status: She is alert and oriented to person, place, and time.  Psychiatric:        Mood and Affect: Mood normal.        Behavior: Behavior normal.    Results for orders placed or performed in visit on 03/15/22 (from the past 24 hour(s))  POC COVID-19     Status: Normal   Collection Time: 03/15/22 10:43 AM  Result Value Ref Range   SARS Coronavirus 2 Ag Negative Negative     ASSESSMENT & PLAN: Problem List Items Addressed This Visit       Respiratory   Acute non-recurrent maxillary sinusitis - Primary    Active and creating a lot of symptoms. Will start Augmentin 875 mg twice a day for 7 days Advised to rest and stay well-hydrated      Relevant Medications   amoxicillin-clavulanate (AUGMENTIN) 875-125 MG tablet   predniSONE (DELTASONE) 20 MG tablet   Sinus congestion    Continue over-the-counter Mucinex DM Start using saline nasal sprays frequently during the day Start prednisone 40 mg daily for 5 days May use over-the-counter Sudafed      Relevant Medications   predniSONE (DELTASONE) 20 MG tablet     Nervous and Auditory   Right otitis media    Start Augmentin 875 mg twice a day for 7 days Continue Tylenol for pain      Relevant Medications   amoxicillin-clavulanate (AUGMENTIN) 875-125 MG tablet     Other   Flu-like symptoms    Negative COVID test. Advised to rest and stay  well-hydrated Medications as prescribed      Relevant Orders   POC COVID-19   Sinus headache    Recommend Tylenol and or Advil as needed      Patient Instructions  Sinus Infection, Adult A sinus infection is soreness and swelling (inflammation) of your sinuses. Sinuses are hollow spaces in the bones around your face. They are located: Around your eyes. In the middle of your forehead. Behind your nose. In your cheekbones. Your sinuses and nasal passages are lined with a fluid called mucus. Mucus drains out of your sinuses. Swelling can trap mucus in your sinuses. This lets germs (bacteria, virus, or fungus) grow, which leads to infection. Most of the time, this condition is caused by a virus. What are the causes? Allergies. Asthma. Germs. Things that block your nose or sinuses. Growths in the nose (nasal polyps). Chemicals or irritants in the air. A fungus. This is rare. What increases the risk? Having a weak body defense system (immune system). Doing a lot of swimming or diving. Using nasal sprays too much. Smoking. What are the signs or symptoms? The main symptoms of this condition are pain and a feeling of pressure around the sinuses. Other symptoms include: Stuffy nose (congestion). This may make it hard to breathe through your nose. Runny nose (drainage). Soreness, swelling, and warmth in the sinuses. A cough that may get worse at night. Being unable to smell and taste. Mucus that collects in the throat or the back of the nose (postnasal drip). This may cause a sore throat or bad breath. Being very tired (fatigued). A fever. How is this diagnosed? Your symptoms. Your medical history. A physical exam. Tests to find out if your condition is short-term (acute) or long-term (chronic). Your doctor may: Check your  nose for growths (polyps). Check your sinuses using a tool that has a light on one end (endoscope). Check for allergies or germs. Do imaging tests, such as  an MRI or CT scan. How is this treated? Treatment for this condition depends on the cause and whether it is short-term or long-term. If caused by a virus, your symptoms should go away on their own within 10 days. You may be given medicines to relieve symptoms. They include: Medicines that shrink swollen tissue in the nose. A spray that treats swelling of the nostrils. Rinses that help get rid of thick mucus in your nose (nasal saline washes). Medicines that treat allergies (antihistamines). Over-the-counter pain relievers. If caused by bacteria, your doctor may wait to see if you will get better without treatment. You may be given antibiotic medicine if you have: A very bad infection. A weak body defense system. If caused by growths in the nose, surgery may be needed. Follow these instructions at home: Medicines Take, use, or apply over-the-counter and prescription medicines only as told by your doctor. These may include nasal sprays. If you were prescribed an antibiotic medicine, take it as told by your doctor. Do not stop taking it even if you start to feel better. Hydrate and humidify  Drink enough water to keep your pee (urine) pale yellow. Use a cool mist humidifier to keep the humidity level in your home above 50%. Breathe in steam for 10-15 minutes, 3-4 times a day, or as told by your doctor. You can do this in the bathroom while a hot shower is running. Try not to spend time in cool or dry air. Rest Rest as much as you can. Sleep with your head raised (elevated). Make sure you get enough sleep each night. General instructions  Put a warm, moist washcloth on your face 3-4 times a day, or as often as told by your doctor. Use nasal saline washes as often as told by your doctor. Wash your hands often with soap and water. If you cannot use soap and water, use hand sanitizer. Do not smoke. Avoid being around people who are smoking (secondhand smoke). Keep all follow-up  visits. Contact a doctor if: You have a fever. Your symptoms get worse. Your symptoms do not get better within 10 days. Get help right away if: You have a very bad headache. You cannot stop vomiting. You have very bad pain or swelling around your face or eyes. You have trouble seeing. You feel confused. Your neck is stiff. You have trouble breathing. These symptoms may be an emergency. Get help right away. Call 911. Do not wait to see if the symptoms will go away. Do not drive yourself to the hospital. Summary A sinus infection is swelling of your sinuses. Sinuses are hollow spaces in the bones around your face. This condition is caused by tissues in your nose that become inflamed or swollen. This traps germs. These can lead to infection. If you were prescribed an antibiotic medicine, take it as told by your doctor. Do not stop taking it even if you start to feel better. Keep all follow-up visits. This information is not intended to replace advice given to you by your health care provider. Make sure you discuss any questions you have with your health care provider. Document Revised: 02/16/2021 Document Reviewed: 02/16/2021 Elsevier Patient Education  2023 [place], MD [facility]

## 2022-03-16 ENCOUNTER — Ambulatory Visit: Payer: BC Managed Care – PPO | Admitting: Family Medicine

## 2022-03-24 LAB — HM MAMMOGRAPHY

## 2022-03-25 ENCOUNTER — Encounter: Payer: Self-pay | Admitting: Internal Medicine

## 2022-03-29 ENCOUNTER — Encounter: Payer: Self-pay | Admitting: Obstetrics & Gynecology

## 2022-04-13 ENCOUNTER — Ambulatory Visit (INDEPENDENT_AMBULATORY_CARE_PROVIDER_SITE_OTHER): Payer: BC Managed Care – PPO

## 2022-04-13 DIAGNOSIS — E538 Deficiency of other specified B group vitamins: Secondary | ICD-10-CM

## 2022-04-13 MED ORDER — CYANOCOBALAMIN 1000 MCG/ML IJ SOLN
1000.0000 ug | Freq: Once | INTRAMUSCULAR | Status: AC
  Administered 2022-04-13: 1000 ug via INTRAMUSCULAR

## 2022-04-13 NOTE — Progress Notes (Addendum)
After obtaining consent, and per orders of Dr. Plotnikov, injection of B12 given by Markis Langland P Sher Shampine. Patient instructed to report any adverse reaction to me immediately.   Medical screening examination/treatment/procedure(s) were performed by non-physician practitioner and as supervising physician I was immediately available for consultation/collaboration.  I agree with above. Aleksei Plotnikov, MD  

## 2022-05-18 ENCOUNTER — Ambulatory Visit (INDEPENDENT_AMBULATORY_CARE_PROVIDER_SITE_OTHER): Payer: BC Managed Care – PPO

## 2022-05-18 DIAGNOSIS — E538 Deficiency of other specified B group vitamins: Secondary | ICD-10-CM | POA: Diagnosis not present

## 2022-05-18 MED ORDER — CYANOCOBALAMIN 1000 MCG/ML IJ SOLN
1000.0000 ug | Freq: Once | INTRAMUSCULAR | Status: AC
  Administered 2022-05-18: 1000 ug via INTRAMUSCULAR

## 2022-05-18 MED ORDER — CYANOCOBALAMIN 1000 MCG/ML IJ SOLN: 1000.0000 ug | Freq: Once | INTRAMUSCULAR | Status: DC

## 2022-05-18 NOTE — Progress Notes (Addendum)
B12 given.  Pt tolerated well. Pt is aware to give the office a call for an side effects or reactions. Please co-sign.   Medical screening examination/treatment/procedure(s) were performed by non-physician practitioner and as supervising physician I was immediately available for consultation/collaboration.  I agree with above. Lew Dawes, MD

## 2022-06-16 ENCOUNTER — Ambulatory Visit: Payer: BC Managed Care – PPO

## 2022-06-22 ENCOUNTER — Ambulatory Visit (INDEPENDENT_AMBULATORY_CARE_PROVIDER_SITE_OTHER): Payer: BC Managed Care – PPO | Admitting: Internal Medicine

## 2022-06-22 ENCOUNTER — Encounter: Payer: Self-pay | Admitting: Internal Medicine

## 2022-06-22 VITALS — BP 120/82 | HR 84 | Temp 98.6°F | Ht 62.0 in | Wt 185.0 lb

## 2022-06-22 DIAGNOSIS — E538 Deficiency of other specified B group vitamins: Secondary | ICD-10-CM | POA: Diagnosis not present

## 2022-06-22 DIAGNOSIS — J302 Other seasonal allergic rhinitis: Secondary | ICD-10-CM

## 2022-06-22 DIAGNOSIS — K219 Gastro-esophageal reflux disease without esophagitis: Secondary | ICD-10-CM

## 2022-06-22 DIAGNOSIS — E785 Hyperlipidemia, unspecified: Secondary | ICD-10-CM | POA: Diagnosis not present

## 2022-06-22 DIAGNOSIS — R739 Hyperglycemia, unspecified: Secondary | ICD-10-CM

## 2022-06-22 DIAGNOSIS — E039 Hypothyroidism, unspecified: Secondary | ICD-10-CM

## 2022-06-22 DIAGNOSIS — J301 Allergic rhinitis due to pollen: Secondary | ICD-10-CM

## 2022-06-22 DIAGNOSIS — I129 Hypertensive chronic kidney disease with stage 1 through stage 4 chronic kidney disease, or unspecified chronic kidney disease: Secondary | ICD-10-CM

## 2022-06-22 DIAGNOSIS — N289 Disorder of kidney and ureter, unspecified: Secondary | ICD-10-CM

## 2022-06-22 MED ORDER — CYANOCOBALAMIN 1000 MCG/ML IJ SOLN
1000.0000 ug | Freq: Once | INTRAMUSCULAR | Status: AC
  Administered 2022-06-22: 1000 ug via INTRAMUSCULAR

## 2022-06-22 MED ORDER — LEVOTHYROXINE SODIUM 88 MCG PO TABS: 88.0000 ug | ORAL_TABLET | Freq: Every day | ORAL | 3 refills | Status: DC

## 2022-06-22 MED ORDER — WEGOVY 0.25 MG/0.5ML ~~LOC~~ SOAJ: 0.2500 mg | SUBCUTANEOUS | 2 refills | Status: DC

## 2022-06-22 MED ORDER — PSEUDOEPHEDRINE HCL ER 120 MG PO TB12: 120.0000 mg | ORAL_TABLET | Freq: Two times a day (BID) | ORAL | 1 refills | Status: DC | PRN

## 2022-06-22 MED ORDER — ALPRAZOLAM 0.25 MG PO TABS: 0.2500 mg | ORAL_TABLET | Freq: Two times a day (BID) | ORAL | 3 refills | Status: DC | PRN

## 2022-06-22 MED ORDER — METHYLPREDNISOLONE ACETATE 80 MG/ML IJ SUSP
80.0000 mg | Freq: Once | INTRAMUSCULAR | Status: AC
  Administered 2022-06-22: 80 mg via INTRAMUSCULAR

## 2022-06-22 NOTE — Progress Notes (Signed)
Subjective:  Patient ID: Vanessa [name], female    DOB: 07/06/1960  Age: 62 y.o. MRN: [id number]  CC: Follow-up (4 mnth f/u, Seasonal allergies flaring up, discuss colonoscopy)   HPI Vanessa [name] presents for B12 def, hypothyroidism, dyslipidemia C/o allergies - worse  Outpatient Medications Prior to Visit  Medication Sig Dispense Refill   Pitavastatin Magnesium (ZYPITAMAG) 2 MG TABS Take 2 mg by mouth daily. 30 tablet 11   VITAMIN D PO Take by mouth.     ALPRAZolam (XANAX) 0.25 MG tablet Take 1 tablet (0.25 mg total) by mouth 2 (two) times daily as needed. 60 tablet 3   levothyroxine (SYNTHROID) 88 MCG tablet Take 1 tablet by mouth once daily 90 tablet 3   Pitavastatin Calcium 2 MG TABS Take 1 tablet (2 mg total) by mouth daily. 30 tablet 11   No facility-administered medications prior to visit.    ROS: Review of Systems  Constitutional:  Negative for activity change, appetite change, chills, fatigue and unexpected weight change.  HENT:  Negative for congestion, mouth sores and sinus pressure.   Eyes:  Negative for visual disturbance.  Respiratory:  Negative for cough and chest tightness.   Gastrointestinal:  Negative for abdominal pain and nausea.  Genitourinary:  Negative for difficulty urinating, frequency and vaginal pain.  Musculoskeletal:  Negative for back pain and gait problem.  Skin:  Negative for pallor and rash.  Neurological:  Negative for dizziness, tremors, weakness, numbness and headaches.  Psychiatric/Behavioral:  Negative for confusion, sleep disturbance and suicidal ideas. The patient is nervous/anxious.     Objective:  BP 120/82 (BP Location: Left Arm, Patient Position: Sitting, Cuff Size: Large)   Pulse 84   Temp 98.6 F (37 C) (Oral)   Ht 5\' 2"  (1.575 m)   Wt 185 lb (83.9 kg)   LMP 03/08/2006   SpO2 96%   BMI 33.84 kg/m   BP Readings from Last 3 Encounters:  06/22/22 120/82  03/15/22 (!) 136/96  02/01/22 130/72    Wt Readings from Last  3 Encounters:  06/22/22 185 lb (83.9 kg)  03/15/22 182 lb 4 oz (82.7 kg)  02/01/22 183 lb 7.2 oz (83.2 kg)    Physical Exam Constitutional:      General: Vanessa [name] is not in acute distress.    Appearance: Vanessa [name] is well-developed. Vanessa [name] is obese.  HENT:     Head: Normocephalic.     Right Ear: External ear normal.     Left Ear: External ear normal.     Nose: Nose normal.  Eyes:     General:        Right eye: No discharge.        Left eye: No discharge.     Conjunctiva/sclera: Conjunctivae normal.     Pupils: Pupils are equal, round, and reactive to light.  Neck:     Thyroid: No thyromegaly.     Vascular: No JVD.     Trachea: No tracheal deviation.  Cardiovascular:     Rate and Rhythm: Normal rate and regular rhythm.     Heart sounds: Normal heart sounds.  Pulmonary:     Effort: No respiratory distress.     Breath sounds: No stridor. No wheezing.  Abdominal:     General: Bowel sounds are normal. There is no distension.     Palpations: Abdomen is soft. There is no mass.     Tenderness: There is no abdominal tenderness. There is no guarding or rebound.  Musculoskeletal:  General: No tenderness.     Cervical back: Normal range of motion and neck supple. No rigidity.     Right lower leg: No edema.     Left lower leg: No edema.  Lymphadenopathy:     Cervical: No cervical adenopathy.  Skin:    Findings: No erythema or rash.  Neurological:     Mental Status: Vanessa [name] is oriented to person, place, and time.     Cranial Nerves: No cranial nerve deficit.     Motor: No abnormal muscle tone.     Coordination: Coordination normal.     Gait: Gait normal.     Deep Tendon Reflexes: Reflexes normal.  Psychiatric:        Behavior: Behavior normal.        Thought Content: Thought content normal.        Judgment: Judgment normal.    Black head on abd   Lab Results  Component Value Date   WBC 11.3 (H) 09/14/2021   HGB 13.8 09/14/2021   HCT 41.8 09/14/2021   PLT 306.0 09/14/2021    GLUCOSE 83 07/01/2022   CHOL 278 (H) 07/01/2022   TRIG 137.0 07/01/2022   HDL 54.00 07/01/2022   LDLDIRECT 199.0 10/15/2018   LDLCALC 196 (H) 07/01/2022   ALT 15 07/01/2022   AST 15 07/01/2022   NA 138 07/01/2022   K 4.0 07/01/2022   CL 104 07/01/2022   CREATININE 1.13 07/01/2022   BUN 25 (H) 07/01/2022   CO2 26 07/01/2022   TSH 4.79 07/01/2022   HGBA1C 6.0 07/01/2022   MICROALBUR 39.7 (H) 05/14/2020    No results found.  Assessment & Plan:   Problem List Items Addressed This Visit       Respiratory   Allergic rhinitis    Worse.  Start over-the-counter Claritin or Zyrtec        Digestive   GERD (gastroesophageal reflux disease)    Restart Protonix if needed        Endocrine   Hypothyroidism    Continue levothyroxine Monitor TSH      Relevant Medications   levothyroxine (SYNTHROID) 88 MCG tablet     Genitourinary   Hypertension, renal disease, stage 1-4 or unspecified chronic kidney disease    CKD st 3 - f/u w/Dr Francene BoyersKruska Hydrate well      Renal insufficiency    CKD st 3 - f/u w/Dr Francene BoyersKruska Hydrate well        Other   B12 deficiency - Primary    Continue on vitamin B12 injections      Dyslipidemia    Obtain lab work -lipids Livalo discussed again      Relevant Orders   TSH (Completed)   Comprehensive metabolic panel (Completed)   Lipid panel (Completed)   Hyperglycemia   Relevant Orders   Hemoglobin A1c (Completed)   Seasonal allergies      Meds ordered this encounter  Medications   pseudoephedrine (SUDAFED) 120 MG 12 hr tablet    Sig: Take 1 tablet (120 mg total) by mouth 2 (two) times daily as needed for congestion.    Dispense:  60 tablet    Refill:  1   ALPRAZolam (XANAX) 0.25 MG tablet    Sig: Take 1 tablet (0.25 mg total) by mouth 2 (two) times daily as needed.    Dispense:  60 tablet    Refill:  3   levothyroxine (SYNTHROID) 88 MCG tablet    Sig: Take 1 tablet (88 mcg total) by mouth  daily.    Dispense:  90 tablet     Refill:  3   Semaglutide-Weight Management (WEGOVY) 0.25 MG/0.5ML SOAJ    Sig: Inject 0.25 mg into the skin once a week.    Dispense:  2 mL    Refill:  2    BMI 33   cyanocobalamin (VITAMIN B12) injection 1,000 mcg   methylPREDNISolone acetate (DEPO-MEDROL) injection 80 mg      Follow-up: Return in about 3 months (around 09/22/2022) for a follow-up visit.  Sonda Primes, MD

## 2022-07-01 ENCOUNTER — Other Ambulatory Visit (INDEPENDENT_AMBULATORY_CARE_PROVIDER_SITE_OTHER): Payer: BC Managed Care – PPO

## 2022-07-01 DIAGNOSIS — R739 Hyperglycemia, unspecified: Secondary | ICD-10-CM | POA: Diagnosis not present

## 2022-07-01 DIAGNOSIS — E785 Hyperlipidemia, unspecified: Secondary | ICD-10-CM

## 2022-07-01 LAB — COMPREHENSIVE METABOLIC PANEL
ALT: 15 U/L (ref 0–35)
AST: 15 U/L (ref 0–37)
Albumin: 4.4 g/dL (ref 3.5–5.2)
Alkaline Phosphatase: 57 U/L (ref 39–117)
BUN: 25 mg/dL — ABNORMAL HIGH (ref 6–23)
CO2: 26 mEq/L (ref 19–32)
Calcium: 9.5 mg/dL (ref 8.4–10.5)
Chloride: 104 mEq/L (ref 96–112)
Creatinine, Ser: 1.13 mg/dL (ref 0.40–1.20)
GFR: 52.27 mL/min — ABNORMAL LOW (ref >60.00)
Glucose, Bld: 83 mg/dL (ref 70–99)
Potassium: 4 mEq/L (ref 3.5–5.1)
Sodium: 138 mEq/L (ref 135–145)
Total Bilirubin: 0.4 mg/dL (ref 0.2–1.2)
Total Protein: 8.1 g/dL (ref 6.0–8.3)

## 2022-07-01 LAB — URINALYSIS, ROUTINE W REFLEX MICROSCOPIC
Bilirubin Urine: NEGATIVE
Ketones, ur: NEGATIVE
Leukocytes,Ua: NEGATIVE
Nitrite: NEGATIVE
Specific Gravity, Urine: >=1.03 — AB (ref 1.000–1.030)
Total Protein, Urine: 100 — AB
Urine Glucose: NEGATIVE
Urobilinogen, UA: 0.2 (ref 0.0–1.0)
pH: 5.5 (ref 5.0–8.0)

## 2022-07-01 LAB — LIPID PANEL
Cholesterol: 278 mg/dL — ABNORMAL HIGH (ref 0–200)
HDL: 54 mg/dL (ref >39.00)
LDL Cholesterol: 196 mg/dL — ABNORMAL HIGH (ref 0–99)
NonHDL: 223.64
Total CHOL/HDL Ratio: 5
Triglycerides: 137 mg/dL (ref 0.0–149.0)
VLDL: 27.4 mg/dL (ref 0.0–40.0)

## 2022-07-01 LAB — TSH: TSH: 4.79 u[IU]/mL (ref 0.35–5.50)

## 2022-07-01 LAB — HEMOGLOBIN A1C: Hgb A1c MFr Bld: 6 % (ref 4.6–6.5)

## 2022-07-04 ENCOUNTER — Encounter: Payer: Self-pay | Admitting: Internal Medicine

## 2022-07-04 DIAGNOSIS — R739 Hyperglycemia, unspecified: Secondary | ICD-10-CM | POA: Insufficient documentation

## 2022-07-04 DIAGNOSIS — J302 Other seasonal allergic rhinitis: Secondary | ICD-10-CM | POA: Insufficient documentation

## 2022-07-04 NOTE — Assessment & Plan Note (Signed)
Restart Protonix if needed

## 2022-07-04 NOTE — Assessment & Plan Note (Addendum)
Obtain lab work -lipids Livalo discussed again

## 2022-07-04 NOTE — Assessment & Plan Note (Signed)
Continue on vitamin B12 injections

## 2022-07-04 NOTE — Assessment & Plan Note (Signed)
CKD st 3 - f/u w/Dr Kruska Hydrate well 

## 2022-07-04 NOTE — Assessment & Plan Note (Signed)
CKD st 3 - f/u w/Dr Francene Boyers well

## 2022-07-04 NOTE — Assessment & Plan Note (Signed)
Worse.  Start over-the-counter Claritin or Zyrtec

## 2022-07-04 NOTE — Assessment & Plan Note (Signed)
Continue levothyroxine. Monitor TSH.

## 2022-07-27 ENCOUNTER — Ambulatory Visit: Payer: BC Managed Care – PPO

## 2022-07-27 ENCOUNTER — Other Ambulatory Visit (HOSPITAL_COMMUNITY): Payer: Self-pay

## 2022-07-27 MED ORDER — SEMAGLUTIDE-WEIGHT MANAGEMENT 0.25 MG/0.5ML ~~LOC~~ SOAJ
0.2500 mg | SUBCUTANEOUS | 2 refills | Status: DC
  Filled 2022-07-27: qty 2, 28d supply, fill #0

## 2022-08-03 ENCOUNTER — Encounter: Payer: Self-pay | Admitting: Internal Medicine

## 2022-08-03 ENCOUNTER — Ambulatory Visit (INDEPENDENT_AMBULATORY_CARE_PROVIDER_SITE_OTHER): Payer: BC Managed Care – PPO

## 2022-08-03 DIAGNOSIS — E538 Deficiency of other specified B group vitamins: Secondary | ICD-10-CM

## 2022-08-03 MED ORDER — CYANOCOBALAMIN 1000 MCG/ML IJ SOLN
1000.0000 ug | Freq: Once | INTRAMUSCULAR | Status: AC
Start: 2022-08-03 — End: 2022-08-03
  Administered 2022-08-03: 1000 ug via INTRAMUSCULAR

## 2022-08-03 NOTE — Progress Notes (Addendum)
After obtaining consent, and per orders of Dr. Plotnikov, injection of B12 given by Orilla Templeman P Paz Fuentes. Patient instructed to report any adverse reaction to me immediately.  Medical screening examination/treatment/procedure(s) were performed by non-physician practitioner and as supervising physician I was immediately available for consultation/collaboration.  I agree with above. Aleksei Plotnikov, MD  

## 2022-08-19 ENCOUNTER — Ambulatory Visit (AMBULATORY_SURGERY_CENTER): Payer: BC Managed Care – PPO | Admitting: *Deleted

## 2022-08-19 ENCOUNTER — Encounter: Payer: Self-pay | Admitting: Internal Medicine

## 2022-08-19 VITALS — Ht 62.0 in | Wt 180.0 lb

## 2022-08-19 DIAGNOSIS — Z1211 Encounter for screening for malignant neoplasm of colon: Secondary | ICD-10-CM

## 2022-08-19 MED ORDER — NA SULFATE-K SULFATE-MG SULF 17.5-3.13-1.6 GM/177ML PO SOLN
1.0000 | Freq: Once | ORAL | 0 refills | Status: AC
Start: 2022-08-19 — End: 2022-08-19

## 2022-08-19 NOTE — Progress Notes (Signed)
Pt's name and DOB verified at the beginning of the pre-visit.  Pt denies any difficulty with ambulating,sitting, laying down or rolling side to side Gave both [facility] main # and MD on call # prior to instructions.  No egg or soy allergy known to patient  Patient denies ever being intubated Pt has no issues moving head neck or swallowing No FH of Malignant Hyperthermia Pt is not on diet pills Pt is not on home 02  Pt is not on blood thinners  Pt denies issues with constipation  Pt is not on dialysis Pt denise any abnormal heart rhythms  Pt denies any upcoming cardiac testing Pt encouraged to use to use Singlecare or Goodrx to reduce cost  Patient's chart reviewed by Cathlyn Parsons CNRA prior to pre-visit and patient appropriate for the [facility].  Pre-visit completed and red dot placed by patient's name on their procedure day (on provider's schedule).  . Visit by phone Pt states weight is 180lb Instructed pt why it is important to and  to call if they have any changes in health or new medications. Directed them to the # given and on instructions.   Pt states they will.  Instructions reviewed with pt and pt states understanding. Instructed to review again prior to procedure. Pt states they will.  Instructions sent by mail with coupon and by my chart

## 2022-08-29 ENCOUNTER — Ambulatory Visit (INDEPENDENT_AMBULATORY_CARE_PROVIDER_SITE_OTHER): Payer: BC Managed Care – PPO | Admitting: Family Medicine

## 2022-08-29 ENCOUNTER — Other Ambulatory Visit: Payer: Self-pay

## 2022-08-29 VITALS — BP 148/88 | HR 89 | Ht 62.0 in | Wt 181.0 lb

## 2022-08-29 DIAGNOSIS — M7662 Achilles tendinitis, left leg: Secondary | ICD-10-CM

## 2022-08-29 DIAGNOSIS — M7752 Other enthesopathy of left foot: Secondary | ICD-10-CM | POA: Diagnosis not present

## 2022-08-29 DIAGNOSIS — G8929 Other chronic pain: Secondary | ICD-10-CM

## 2022-08-29 DIAGNOSIS — M79672 Pain in left foot: Secondary | ICD-10-CM

## 2022-08-29 NOTE — Patient Instructions (Addendum)
Thank you for coming in today.   Start gabapentin 100mg  at night for first week then 200mg  nightly thereafter if not causing grogginess.    Please use Voltaren gel (Generic Diclofenac Gel) up to 4x daily for pain as needed.  This is available over-the-counter as both the name brand Voltaren gel and the generic diclofenac gel.   Please complete the exercises that the athletic trainer went over with you:  View at [email or web address] using code: [email or web address]  Check back in 1 month

## 2022-08-29 NOTE — Progress Notes (Signed)
   Rubin Payor, PhD, LAT, ATC acting as a scribe for Clementeen Graham, MD.  Vanessa [name] is a 62 y.o. female who presents to [facility] at [facility] today for L ankle pain. Pt was previously seen by Dr. Denyse Amass on 11/25/21 for trigger finger on her L hand.   Today, pt c/o L ankle pain x a couple weeks. No MOI. She works for the [employer], pulling criminal court records, very up and down. She also notes she has been doing more walking w/ her husband recreationally. Pt locates pain to the posterior aspect of the L calcaneous and into the lateral ankle.   L ankle swelling: no Aggravates: Treatments tried: Epsom salt soak, tylenol, lidocaine cream  Pertinent review of systems: No fevers or chills  Relevant historical information: Hypertension.  Smoker.   Exam:  BP (!) 148/88   Pulse 89   Ht 5\' 2"  (1.575 m)   Wt 181 lb (82.1 kg)   LMP 03/08/2006   SpO2 98%   BMI 33.11 kg/m  General: Well Developed, well nourished, and in no acute distress.   MSK: Left ankle: Some swelling present at posterior ankle.  Tender palpation Achilles tendon.  Nontender elsewhere.  Normal foot and ankle motion.  Pain with dorsiflexion and resisted plantarflexion.  Stable ligamentous exam.   Lab and Radiology Results  Diagnostic Limited MSK Ultrasound of: Left Achilles tendon Achilles tendon is intact.  However there is calcific change at distal Achilles tendon near insertion on the calcaneus consistent with chronic calcific tendinopathy. There is also moderate to large retrocalcaneal bursitis. 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 female with left posterior foot and ankle pain thought to be due to chronic calcific Achilles tendinitis and retrocalcaneal bursitis.  Plan for Voltaren gel and home exercise program.  Check back in 1 month.  If not improved consider  injection retrocalcaneal bursa.  Recommend CAM Walker boot if she hurts badly enough to use it.  Handicap placard form filled out today.   PDMP not reviewed this encounter. Orders Placed This Encounter  Procedures   [place] LIMITED JOINT SPACE STRUCTURES LOW LEFT(NO LINKED CHARGES)    Order Specific Question:   Reason for Exam (SYMPTOM  OR DIAGNOSIS REQUIRED)    Answer:   left heel pain    Order Specific Question:   Preferred imaging location?    Answer:   [facility]   No orders of the defined types were placed in this encounter.    Discussed warning signs or symptoms. Please see discharge instructions. Patient expresses understanding.   The above documentation has been reviewed and is accurate and complete Clementeen Graham, M.D.

## 2022-09-04 ENCOUNTER — Encounter: Payer: Self-pay | Admitting: Certified Registered Nurse Anesthetist

## 2022-09-05 ENCOUNTER — Ambulatory Visit: Payer: BC Managed Care – PPO

## 2022-09-08 ENCOUNTER — Ambulatory Visit (AMBULATORY_SURGERY_CENTER): Payer: BC Managed Care – PPO | Admitting: Internal Medicine

## 2022-09-08 ENCOUNTER — Encounter: Payer: Self-pay | Admitting: Internal Medicine

## 2022-09-08 VITALS — BP 131/74 | HR 61 | Temp 97.5°F | Resp 15 | Ht 62.0 in | Wt 180.0 lb

## 2022-09-08 DIAGNOSIS — K635 Polyp of colon: Secondary | ICD-10-CM | POA: Diagnosis not present

## 2022-09-08 DIAGNOSIS — D124 Benign neoplasm of descending colon: Secondary | ICD-10-CM | POA: Diagnosis not present

## 2022-09-08 DIAGNOSIS — Z1211 Encounter for screening for malignant neoplasm of colon: Secondary | ICD-10-CM

## 2022-09-08 MED ORDER — SODIUM CHLORIDE 0.9 % IV SOLN
500.0000 mL | Freq: Once | INTRAVENOUS | Status: DC
Start: 2022-09-08 — End: 2022-09-08

## 2022-09-08 NOTE — Progress Notes (Signed)
Report given to PACU, vss 

## 2022-09-08 NOTE — Progress Notes (Signed)
Pt's states no medical or surgical changes since previsit or office visit. 

## 2022-09-08 NOTE — Op Note (Signed)
[facility] Patient Name: Vanessa [name] Procedure Date: 09/08/2022 9:07 AM MRN: [id number] Endoscopist: Iva Boop , MD, [id number] Age: 62 Referring MD:  Date of Birth: [birth year] Gender: Female Account #: [id number] Procedure:                Colonoscopy Indications:              Screening for colorectal malignant neoplasm, Last                            colonoscopy: 2014 Medicines:                Monitored Anesthesia Care Procedure:                Pre-Anesthesia Assessment:                           - Prior to the procedure, a History and Physical                            was performed, and patient medications and                            allergies were reviewed. The patient's tolerance of                            previous anesthesia was also reviewed. The risks                            and benefits of the procedure and the sedation                            options and risks were discussed with the patient.                            All questions were answered, and informed consent                            was obtained. Prior Anticoagulants: The patient has                            taken no anticoagulant or antiplatelet agents. ASA                            Grade Assessment: II - A patient with mild systemic                            disease. After reviewing the risks and benefits,                            the patient was deemed in satisfactory condition to                            undergo the procedure.  After obtaining informed consent, the colonoscope                            was passed under direct vision. Throughout the                            procedure, the patient's blood pressure, pulse, and                            oxygen saturations were monitored continuously. The                            Olympus CF-HQ190L ([id number]) Colonoscope was                            introduced through the anus and advanced to  the the                            cecum, identified by appendiceal orifice and                            ileocecal valve. The colonoscopy was performed                            without difficulty. The patient tolerated the                            procedure well. The quality of the bowel                            preparation was adequate. The bowel preparation                            used was SUPREP via split dose instruction. The                            ileocecal valve, appendiceal orifice, and rectum                            were photographed. Scope In: 9:14:50 AM Scope Out: 9:27:25 AM Scope Withdrawal Time: 0 hours 10 minutes 22 seconds  Total Procedure Duration: 0 hours 12 minutes 35 seconds  Findings:                 The perianal and digital rectal examinations were                            normal.                           A diminutive polyp was found in the descending                            colon. The polyp was sessile. The polyp was removed  with a cold snare. Resection and retrieval were                            complete. Verification of patient identification                            for the specimen was done. Estimated blood loss was                            minimal.                           The exam was otherwise without abnormality on                            direct and retroflexion views. Complications:            No immediate complications. Estimated Blood Loss:     Estimated blood loss was minimal. Impression:               - One diminutive polyp in the descending colon,                            removed with a cold snare. Resected and retrieved.                           - The examination was otherwise normal on direct                            and retroflexion views. Recommendation:           - Patient has a contact number available for                            emergencies. The signs and symptoms of potential                             delayed complications were discussed with the                            patient. Return to normal activities tomorrow.                            Written discharge instructions were provided to the                            patient.                           - Resume previous diet.                           - Continue present medications.                           - Repeat colonoscopy is recommended. The  colonoscopy date will be determined after pathology                            results from today's exam become available for                            review. Iva Boop, MD 09/08/2022 9:34:13 AM This report has been signed electronically.

## 2022-09-08 NOTE — Progress Notes (Signed)
Called to room to assist during endoscopic procedure.  Patient ID and intended procedure confirmed with present staff. Received instructions for my participation in the procedure from the performing physician.  

## 2022-09-08 NOTE — Progress Notes (Signed)
[facility] History and Physical   Primary Care Physician:  Tresa Garter, MD   Reason for Procedure:   CRCA screening  Plan:    colonoscopy     HPI: Vanessa [name] is a 62 y.o. female for repeat screening, last colonoscopy 07/2012   Past Medical History:  Diagnosis Date   Allergy    rhinitis   Anxiety    B12 deficiency    Cyst    gangeous- left hand   Decreased kidney fxn    Depression    Hyperlipidemia    Hypothyroidism    Hypothyroidism    Tobacco abuse    Vitamin D deficiency 11/2017   Value 21    Past Surgical History:  Procedure Laterality Date   CESAREAN SECTION  1992   EYE SURGERY     GANGLION CYST EXCISION  2009   right ankle    Prior to Admission medications   Medication Sig Start Date End Date Taking? Authorizing Provider  ALPRAZolam (XANAX) 0.25 MG tablet Take 1 tablet (0.25 mg total) by mouth 2 (two) times daily as needed. 06/22/22  Yes Plotnikov, Georgina Quint, MD  levothyroxine (SYNTHROID) 88 MCG tablet Take 1 tablet (88 mcg total) by mouth daily. 06/22/22  Yes Plotnikov, Georgina Quint, MD  Cyanocobalamin (B-12 COMPLIANCE INJECTION IJ) Inject as directed. Every 30 days    [provider]  Cyanocobalamin (VITAMIN B-12) 5000 MCG SUBL Place under the tongue.    [provider]  pseudoephedrine (SUDAFED) 120 MG 12 hr tablet Take 1 tablet (120 mg total) by mouth 2 (two) times daily as needed for congestion. Patient not taking: Reported on 08/19/2022 06/22/22 06/22/23  Plotnikov, Georgina Quint, MD  VITAMIN D PO Take by mouth.    [provider]    Current Outpatient Medications  Medication Sig Dispense Refill   ALPRAZolam (XANAX) 0.25 MG tablet Take 1 tablet (0.25 mg total) by mouth 2 (two) times daily as needed. 60 tablet 3   levothyroxine (SYNTHROID) 88 MCG tablet Take 1 tablet (88 mcg total) by mouth daily. 90 tablet 3   Cyanocobalamin (B-12 COMPLIANCE INJECTION IJ) Inject as directed. Every 30 days      Cyanocobalamin (VITAMIN B-12) 5000 MCG SUBL Place under the tongue.     pseudoephedrine (SUDAFED) 120 MG 12 hr tablet Take 1 tablet (120 mg total) by mouth 2 (two) times daily as needed for congestion. (Patient not taking: Reported on 08/19/2022) 60 tablet 1   VITAMIN D PO Take by mouth.     Current Facility-Administered Medications  Medication Dose Route Frequency Provider Last Rate Last Admin   0.9 %  sodium chloride infusion  500 mL Intravenous Once Iva Boop, MD        Allergies as of 09/08/2022 - Review Complete 09/08/2022  Allergen Reaction Noted   Atorvastatin     Crestor [rosuvastatin calcium]  08/09/2017   Ezetimibe-simvastatin     Hctz [hydrochlorothiazide]  10/21/2020   Hydrocodone bit-homatrop mbr Itching 04/27/2018    Family History  Problem Relation Age of Onset   Thyroid disease Mother        Low   Other Mother        B12 deficiency   Diabetes Mother    Hyperlipidemia Mother    Heart disease Father 21       CAD   Hyperlipidemia Father    Heart failure Father    Sudden death Father    Psoriasis Son    Coronary artery disease  Other    Hyperlipidemia Other    Colon cancer Neg Hx    Colon polyps Neg Hx    Esophageal cancer Neg Hx    Rectal cancer Neg Hx    Stomach cancer Neg Hx     Social History   Socioeconomic History   Marital status: Married    Spouse name: Not on file   Number of children: Not on file   Years of education: Not on file   Highest education level: Not on file  Occupational History   Occupation: Air traffic controller  Tobacco Use   Smoking status: Every Day    Packs/day: .25    Types: Cigarettes   Smokeless tobacco: Never  Vaping Use   Vaping Use: Never used  Substance and Sexual Activity   Alcohol use: Yes    Comment: occ   Drug use: Never   Sexual activity: Yes    Birth control/protection: Post-menopausal    Comment: 1st intercourse 62 yo-Fewer than 5 partners  Other Topics Concern   Not on file  Social History Narrative    Not on file   Social Determinants of Health   Financial Resource Strain: Not on file  Food Insecurity: Not on file  Transportation Needs: Not on file  Physical Activity: Not on file  Stress: Not on file  Social Connections: Not on file  Intimate Partner Violence: Not on file    Review of Systems:  All other review of systems negative except as mentioned in the HPI.  Physical Exam: Vital signs BP (!) 157/93   Pulse 62   Temp (!) 97.5 F (36.4 C)   Resp 14   Ht 5\' 2"  (1.575 m)   Wt 180 lb (81.6 kg)   LMP 03/08/2006   SpO2 99%   BMI 32.92 kg/m   General:   Alert,  Well-developed, well-nourished, pleasant and cooperative in NAD Lungs:  Clear throughout to auscultation.   Heart:  Regular rate and rhythm; no murmurs, clicks, rubs,  or gallops. Abdomen:  Soft, nontender and nondistended. Normal bowel sounds.   Neuro/Psych:  Alert and cooperative. Normal mood and affect. A and O x 3   @Jora Galluzzo  Sena Slate, MD, [facility] [phone] (pager) 09/08/2022 9:05 AM@

## 2022-09-08 NOTE — Patient Instructions (Addendum)
I found and removed one tiny polyp today - looks benign. All else ok.  I will let you know pathology results and when to have another routine colonoscopy by mail and/or My Chart.  Iva Boop, MD, FACG   YOU HAD AN ENDOSCOPIC PROCEDURE TODAY AT THE [facility]:   Refer to the procedure report that was given to you for any specific questions about what was found during the examination.  If the procedure report does not answer your questions, please call your gastroenterologist to clarify.  If you requested that your care partner not be given the details of your procedure findings, then the procedure report has been included in a sealed envelope for you to review at your convenience later.  YOU SHOULD EXPECT: Some feelings of bloating in the abdomen. Passage of more gas than usual.  Walking can help get rid of the air that was put into your GI tract during the procedure and reduce the bloating. If you had a lower endoscopy (such as a colonoscopy or flexible sigmoidoscopy) you may notice spotting of blood in your stool or on the toilet paper. If you underwent a bowel prep for your procedure, you may not have a normal bowel movement for a few days.  Please Note:  You might notice some irritation and congestion in your nose or some drainage.  This is from the oxygen used during your procedure.  There is no need for concern and it should clear up in a day or so.  SYMPTOMS TO REPORT IMMEDIATELY:  Following lower endoscopy (colonoscopy or flexible sigmoidoscopy):  Excessive amounts of blood in the stool  Significant tenderness or worsening of abdominal pains  Swelling of the abdomen that is new, acute  Fever of 100F or higher  For urgent or emergent issues, a gastroenterologist can be reached at any hour by calling [phone]. Do not use MyChart messaging for urgent concerns.    DIET:  We do recommend a small meal at first, but then you may proceed to your regular diet.  Drink  plenty of fluids but you should avoid alcoholic beverages for 24 hours.  ACTIVITY:  You should plan to take it easy for the rest of today and you should NOT DRIVE or use heavy machinery until tomorrow (because of the sedation medicines used during the test).    FOLLOW UP: Our staff will call the number listed on your records the next business day following your procedure.  We will call around 7:15- 8:00 am to check on you and address any questions or concerns that you may have regarding the information given to you following your procedure. If we do not reach you, we will leave a message.     If any biopsies were taken you will be contacted by phone or by letter within the next 1-3 weeks.  Please call us at [phone] if you have not heard about the biopsies in 3 weeks.    SIGNATURES/CONFIDENTIALITY: You and/or your care partner have signed paperwork which will be entered into your electronic medical record.  These signatures attest to the fact that that the information above on your After Visit Summary has been reviewed and is understood.  Full responsibility of the confidentiality of this discharge information lies with you and/or your care-partner.

## 2022-09-09 ENCOUNTER — Telehealth: Payer: Self-pay | Admitting: *Deleted

## 2022-09-09 NOTE — Telephone Encounter (Signed)
  Follow up Call-     09/08/2022    8:46 AM  Call back number  Post procedure Call Back phone  # [phone]  Permission to leave phone message Yes     Patient questions:  Do you have a fever, pain , or abdominal swelling? No. Pain Score  0 *  Have you tolerated food without any problems? Yes.    Have you been able to return to your normal activities? Yes.    Do you have any questions about your discharge instructions: Diet   No. Medications  No. Follow up visit  No.  Do you have questions or concerns about your Care? No.  Actions: * If pain score is 4 or above: No action needed, pain <4.

## 2022-09-14 ENCOUNTER — Ambulatory Visit: Payer: BC Managed Care – PPO

## 2022-09-15 ENCOUNTER — Ambulatory Visit (INDEPENDENT_AMBULATORY_CARE_PROVIDER_SITE_OTHER): Payer: BC Managed Care – PPO | Admitting: *Deleted

## 2022-09-15 DIAGNOSIS — E538 Deficiency of other specified B group vitamins: Secondary | ICD-10-CM | POA: Diagnosis not present

## 2022-09-15 MED ORDER — CYANOCOBALAMIN 1000 MCG/ML IJ SOLN
1000.0000 ug | Freq: Once | INTRAMUSCULAR | Status: AC
Start: 2022-09-15 — End: 2022-09-15
  Administered 2022-09-15: 1000 ug via INTRAMUSCULAR

## 2022-09-15 NOTE — Progress Notes (Addendum)
Pls cosign for B12 inj../lmb   Medical screening examination/treatment/procedure(s) were performed by non-physician practitioner and as supervising physician I was immediately available for consultation/collaboration.  I agree with above. Aleksei Plotnikov, MD  

## 2022-09-19 ENCOUNTER — Encounter: Payer: Self-pay | Admitting: Internal Medicine

## 2022-09-19 ENCOUNTER — Other Ambulatory Visit: Payer: Self-pay | Admitting: Internal Medicine

## 2022-09-19 MED ORDER — WEGOVY 1 MG/0.5ML ~~LOC~~ SOAJ: 1.0000 mg | SUBCUTANEOUS | 3 refills | Status: DC

## 2022-09-19 NOTE — Progress Notes (Signed)
See the message.

## 2022-09-22 ENCOUNTER — Encounter: Payer: Self-pay | Admitting: Internal Medicine

## 2022-09-28 ENCOUNTER — Encounter: Payer: Self-pay | Admitting: Family Medicine

## 2022-09-28 ENCOUNTER — Ambulatory Visit: Payer: BC Managed Care – PPO | Admitting: Family Medicine

## 2022-09-28 VITALS — BP 142/96 | HR 73 | Temp 98.7°F | Ht 62.0 in | Wt 181.0 lb

## 2022-09-28 DIAGNOSIS — J0100 Acute maxillary sinusitis, unspecified: Secondary | ICD-10-CM | POA: Diagnosis not present

## 2022-09-28 DIAGNOSIS — R030 Elevated blood-pressure reading, without diagnosis of hypertension: Secondary | ICD-10-CM | POA: Diagnosis not present

## 2022-09-28 DIAGNOSIS — R0981 Nasal congestion: Secondary | ICD-10-CM | POA: Diagnosis not present

## 2022-09-28 DIAGNOSIS — H6691 Otitis media, unspecified, right ear: Secondary | ICD-10-CM | POA: Diagnosis not present

## 2022-09-28 LAB — POC COVID19 BINAXNOW: SARS Coronavirus 2 Ag: NEGATIVE

## 2022-09-28 MED ORDER — AMOXICILLIN-POT CLAVULANATE 875-125 MG PO TABS
1.0000 | ORAL_TABLET | Freq: Two times a day (BID) | ORAL | 0 refills | Status: DC
Start: 2022-09-28 — End: 2022-10-14

## 2022-09-28 NOTE — Patient Instructions (Signed)
Take the antibiotic as prescribed with food and plenty of water.  Continue using Flonase nasal spray, taking Mucinex DM and Tylenol or ibuprofen as needed.

## 2022-09-28 NOTE — Progress Notes (Signed)
Subjective:  Vanessa [name] is a 62 y.o. female who presents for a 6 day hx of fatigue, nasal congestion, rhinorrhea, frontal headache, right ear pain, mild ST and mild cough.   Denies fever, chills, body aches, chest pain, palpitations, shortness of breath, abdominal pain, N/V/D.   Treatment to date:  Sudafed, Flonase and Mucinex DM .  Denies sick contacts.  No other aggravating or relieving factors.  No other c/o.  ROS as in subjective.   Objective: Vitals:   09/28/22 0952 09/28/22 1019  BP: (!) 160/100 (!) 142/96  Pulse: 73   Temp: 98.7 F (37.1 C)   SpO2: 97%     General appearance: Alert, WD/WN, no distress, mildly ill appearing                             Skin: warm, no rash                           Head: + maxillary sinus tenderness                            Eyes: conjunctiva normal, corneas clear, PERRLA                            Ears: right TM with erythema, poor light reflex and bulging, left TM with good light reflex, external ear canals normal                          Nose: septum midline, turbinates swollen, with erythema and clear discharge             Mouth/throat: MMM                           Neck: supple, no adenopathy, no thyromegaly, nontender                          Heart: RRR                         Lungs: CTA bilaterally, no wheezes, rales, or rhonchi      Assessment: Acute otitis media, right - Plan: amoxicillin-clavulanate (AUGMENTIN) 875-125 MG tablet  Nasal congestion - Plan: POC COVID-19  Acute non-recurrent maxillary sinusitis - Plan: amoxicillin-clavulanate (AUGMENTIN) 875-125 MG tablet  Elevated blood pressure reading in office without diagnosis of hypertension   Plan: Negative COVID test.  No acute distress.  Moderately ill. Augmentin prescribed.  Suggested symptomatic OTC remedies. Nasal saline spray and Flonase for congestion.  Tylenol or Ibuprofen OTC as needed.   Advised her to monitor her blood pressure at home due to it being  significantly elevated today.  Follow-up with PCP if not improving.

## 2022-10-04 ENCOUNTER — Ambulatory Visit: Payer: BC Managed Care – PPO | Admitting: Family Medicine

## 2022-10-14 ENCOUNTER — Ambulatory Visit: Payer: BC Managed Care – PPO | Admitting: Internal Medicine

## 2022-10-14 ENCOUNTER — Encounter: Payer: Self-pay | Admitting: Internal Medicine

## 2022-10-14 VITALS — BP 160/100 | HR 81 | Temp 98.8°F | Ht 62.0 in | Wt 182.0 lb

## 2022-10-14 DIAGNOSIS — U071 COVID-19: Secondary | ICD-10-CM

## 2022-10-14 DIAGNOSIS — E538 Deficiency of other specified B group vitamins: Secondary | ICD-10-CM

## 2022-10-14 LAB — POC COVID19 BINAXNOW: SARS Coronavirus 2 Ag: POSITIVE — AB

## 2022-10-14 MED ORDER — CYANOCOBALAMIN 1000 MCG/ML IJ SOLN
1000.0000 ug | Freq: Once | INTRAMUSCULAR | Status: AC
Start: 2022-10-14 — End: 2022-10-14
  Administered 2022-10-14: 1000 ug via INTRAMUSCULAR

## 2022-10-14 NOTE — Assessment & Plan Note (Signed)
B12 shot given today to continue treatment monthly.

## 2022-10-14 NOTE — Progress Notes (Signed)
   Subjective:   [name], female    DOB: [birth year], 62 y.o.   MRN: [id number]  HPI [name] is a 62 YO female coming in for acute symptoms for 4 days. Just finished antibiotics for sinus infection and was improving/improved.   Review of Systems  Constitutional:  Positive for activity change. Negative for appetite change, chills, fatigue, fever and unexpected weight change.  HENT:  Positive for congestion, postnasal drip, rhinorrhea and sinus pressure. Negative for ear discharge, ear pain, sinus pain, sneezing, sore throat, tinnitus, trouble swallowing and voice change.   Eyes: Negative.   Respiratory:  Positive for cough. Negative for chest tightness, shortness of breath and wheezing.   Cardiovascular: Negative.   Gastrointestinal: Negative.   Musculoskeletal:  Positive for myalgias.  Neurological: Negative.     Objective:  Physical Exam Constitutional:      Appearance: She is well-developed.  HENT:     Head: Normocephalic and atraumatic.     Comments: Oropharynx with redness and clear drainage, nose with swollen turbinates, TMs normal bilaterally.  Neck:     Thyroid: No thyromegaly.  Cardiovascular:     Rate and Rhythm: Normal rate and regular rhythm.  Pulmonary:     Effort: Pulmonary effort is normal. No respiratory distress.     Breath sounds: Normal breath sounds. No wheezing or rales.  Abdominal:     Palpations: Abdomen is soft.  Musculoskeletal:        General: Tenderness present.     Cervical back: Normal range of motion.  Lymphadenopathy:     Cervical: No cervical adenopathy.  Skin:    General: Skin is warm and dry.  Neurological:     Mental Status: She is alert and oriented to person, place, and time.     Vitals:   10/14/22 1508 10/14/22 1510  BP: (!) 160/100 (!) 160/100  Pulse: 81   Temp: 98.8 F (37.1 C)   TempSrc: Oral   SpO2: 93%   Weight: 182 lb (82.6 kg)   Height: 5\' 2"  (1.575 m)   POC covid-19 positive.   Assessment &  Plan:  B12 given at visit

## 2022-10-14 NOTE — Assessment & Plan Note (Signed)
New symptoms consistent with covid-19 and in last 4 days. POC covid-19 testing done and positive in office. Discussed benefit/risk from paxlovid and come to decision that likely small benefit would not be better than side effects and will not treat. She is advised to use flonase she just bought. Otc cough medication. Counseled on new CDC guidelines and work note given.

## 2022-10-17 ENCOUNTER — Ambulatory Visit: Payer: BC Managed Care – PPO

## 2022-10-26 ENCOUNTER — Encounter (INDEPENDENT_AMBULATORY_CARE_PROVIDER_SITE_OTHER): Payer: Self-pay

## 2022-11-14 ENCOUNTER — Telehealth: Payer: Self-pay | Admitting: *Deleted

## 2022-11-14 ENCOUNTER — Ambulatory Visit (INDEPENDENT_AMBULATORY_CARE_PROVIDER_SITE_OTHER): Payer: BC Managed Care – PPO | Admitting: *Deleted

## 2022-11-14 DIAGNOSIS — E538 Deficiency of other specified B group vitamins: Secondary | ICD-10-CM | POA: Diagnosis not present

## 2022-11-14 MED ORDER — CYANOCOBALAMIN 1000 MCG/ML IJ SOLN
1000.0000 ug | Freq: Once | INTRAMUSCULAR | Status: AC
Start: 2022-11-14 — End: 2022-11-14
  Administered 2022-11-14: 1000 ug via INTRAMUSCULAR

## 2022-11-14 NOTE — Progress Notes (Addendum)
Place sign for b12 injection.Marland Kitchenlmb  Medical screening examination/treatment/procedure(s) were performed by non-physician practitioner and as supervising physician I was immediately available for consultation/collaboration.  I agree with above. Jacinta Shoe, MD

## 2022-11-14 NOTE — Telephone Encounter (Signed)
Pt came in for her B12 injection. She also states that she need refill on her alprazolam../lmb

## 2022-11-15 ENCOUNTER — Encounter: Payer: Self-pay | Admitting: Internal Medicine

## 2022-11-16 ENCOUNTER — Other Ambulatory Visit: Payer: Self-pay | Admitting: Internal Medicine

## 2022-11-16 NOTE — Telephone Encounter (Signed)
Pt called back about her Rx.

## 2022-11-18 NOTE — Telephone Encounter (Signed)
It was filled on 8/22.  Please schedule follow-up office visit

## 2022-12-15 ENCOUNTER — Ambulatory Visit (INDEPENDENT_AMBULATORY_CARE_PROVIDER_SITE_OTHER): Payer: BC Managed Care – PPO | Admitting: Radiology

## 2022-12-15 DIAGNOSIS — E538 Deficiency of other specified B group vitamins: Secondary | ICD-10-CM | POA: Diagnosis not present

## 2022-12-15 MED ORDER — CYANOCOBALAMIN 1000 MCG/ML IJ SOLN
1000.0000 ug | Freq: Once | INTRAMUSCULAR | Status: AC
Start: 2022-12-15 — End: 2022-12-15
  Administered 2022-12-15: 1000 ug via INTRAMUSCULAR

## 2022-12-15 NOTE — Progress Notes (Cosign Needed Addendum)
Patient her for monthly b12 injections. Patient tolerate well with no complications.  Medical screening examination/treatment/procedure(s) were performed by non-physician practitioner and as supervising physician I was immediately available for consultation/collaboration.  I agree with above. Jacinta Shoe, MD

## 2022-12-21 ENCOUNTER — Ambulatory Visit: Payer: BC Managed Care – PPO | Admitting: Internal Medicine

## 2023-01-16 ENCOUNTER — Ambulatory Visit (INDEPENDENT_AMBULATORY_CARE_PROVIDER_SITE_OTHER): Payer: BC Managed Care – PPO

## 2023-01-16 ENCOUNTER — Telehealth: Payer: Self-pay

## 2023-01-16 DIAGNOSIS — E538 Deficiency of other specified B group vitamins: Secondary | ICD-10-CM | POA: Diagnosis not present

## 2023-01-16 MED ORDER — CYANOCOBALAMIN 1000 MCG/ML IJ SOLN
1000.0000 ug | Freq: Once | INTRAMUSCULAR | Status: AC
Start: 2023-01-16 — End: 2023-01-16
  Administered 2023-01-16: 1000 ug via INTRAMUSCULAR

## 2023-01-16 NOTE — Progress Notes (Signed)
Pt was given B12 injection w/o any complications. 

## 2023-01-16 NOTE — Telephone Encounter (Signed)
Pt has asked for a rx refill of her ALPRAZolam (XANAX) 0.25 MG tablet.

## 2023-01-17 MED ORDER — ALPRAZOLAM 0.25 MG PO TABS: 0.2500 mg | ORAL_TABLET | Freq: Two times a day (BID) | ORAL | 1 refills | Status: DC | PRN

## 2023-01-17 NOTE — Telephone Encounter (Signed)
Okay.  Thanks.

## 2023-01-17 NOTE — Addendum Note (Signed)
Addended by: Tresa Garter on: 01/17/2023 07:34 AM   Modules accepted: Orders

## 2023-02-08 ENCOUNTER — Ambulatory Visit: Payer: BC Managed Care – PPO | Admitting: Internal Medicine

## 2023-02-20 ENCOUNTER — Ambulatory Visit: Payer: BC Managed Care – PPO

## 2023-02-20 ENCOUNTER — Encounter: Payer: Self-pay | Admitting: Internal Medicine

## 2023-02-20 ENCOUNTER — Ambulatory Visit: Payer: BC Managed Care – PPO | Admitting: Internal Medicine

## 2023-02-20 VITALS — BP 118/78 | HR 76 | Temp 98.6°F | Ht 62.0 in | Wt 186.0 lb

## 2023-02-20 DIAGNOSIS — E538 Deficiency of other specified B group vitamins: Secondary | ICD-10-CM | POA: Diagnosis not present

## 2023-02-20 DIAGNOSIS — R635 Abnormal weight gain: Secondary | ICD-10-CM | POA: Diagnosis not present

## 2023-02-20 DIAGNOSIS — F419 Anxiety disorder, unspecified: Secondary | ICD-10-CM

## 2023-02-20 DIAGNOSIS — E039 Hypothyroidism, unspecified: Secondary | ICD-10-CM

## 2023-02-20 MED ORDER — CYANOCOBALAMIN 1000 MCG/ML IJ SOLN
1000.0000 ug | Freq: Once | INTRAMUSCULAR | Status: AC
Start: 2023-02-20 — End: 2023-02-20
  Administered 2023-02-20: 1000 ug via INTRAMUSCULAR

## 2023-02-20 MED ORDER — METFORMIN HCL ER 500 MG PO TB24: 500.0000 mg | ORAL_TABLET | Freq: Every day | ORAL | 11 refills | Status: DC

## 2023-02-20 MED ORDER — ALPRAZOLAM 0.25 MG PO TABS: 0.2500 mg | ORAL_TABLET | Freq: Two times a day (BID) | ORAL | 3 refills | Status: DC | PRN

## 2023-02-20 NOTE — Assessment & Plan Note (Addendum)
Start Metformin XR to help to improve metabolism Phentermine 37.5 mg option is discussed  Potential benefits of a long term phentermine use as well as potential risks  and complications were explained to the patient and were aknowledged.

## 2023-02-20 NOTE — Progress Notes (Signed)
Subjective:  Patient ID: Randell Patient, female    DOB: November 22, 1960  Age: 62 y.o. MRN: [id number]  CC: Medical Management of Chronic Issues (6 MNTH F/U)   HPI OPHA RAMES presents for B12, hypothyroidism,hyperglycemia  Outpatient Medications Prior to Visit  Medication Sig Dispense Refill   Cyanocobalamin (B-12 COMPLIANCE INJECTION IJ) Inject as directed. Every 30 days     Cyanocobalamin (VITAMIN B-12) 5000 MCG SUBL Place under the tongue.     levothyroxine (SYNTHROID) 88 MCG tablet Take 1 tablet (88 mcg total) by mouth daily. 90 tablet 3   ALPRAZolam (XANAX) 0.25 MG tablet Take 1 tablet (0.25 mg total) by mouth 2 (two) times daily as needed. 60 tablet 1   Semaglutide-Weight Management (WEGOVY) 1 MG/0.5ML SOAJ Inject 1 mg into the skin once a week. 2 mL 3   VITAMIN D PO Take by mouth.     No facility-administered medications prior to visit.    ROS: Review of Systems  Constitutional:  Negative for activity change, appetite change, chills, fatigue and unexpected weight change.  HENT:  Negative for congestion, mouth sores and sinus pressure.   Eyes:  Negative for visual disturbance.  Respiratory:  Negative for cough and chest tightness.   Gastrointestinal:  Negative for abdominal pain and nausea.  Genitourinary:  Negative for difficulty urinating, frequency and vaginal pain.  Musculoskeletal:  Positive for back pain. Negative for gait problem.  Skin:  Negative for pallor and rash.  Neurological:  Negative for dizziness, tremors, weakness, numbness and headaches.  Psychiatric/Behavioral:  Negative for confusion, sleep disturbance and suicidal ideas.     Objective:  BP 118/78 (BP Location: Left Arm, Patient Position: Sitting, Cuff Size: Normal)   Pulse 76   Temp 98.6 F (37 C) (Oral)   Ht 5\' 2"  (1.575 m)   Wt 186 lb (84.4 kg)   LMP 03/08/2006   SpO2 97%   BMI 34.02 kg/m   BP Readings from Last 3 Encounters:  02/20/23 118/78  10/14/22 (!) 160/100  09/28/22 (!) 142/96     Wt Readings from Last 3 Encounters:  02/20/23 186 lb (84.4 kg)  10/14/22 182 lb (82.6 kg)  09/28/22 181 lb (82.1 kg)    Physical Exam Constitutional:      General: She is not in acute distress.    Appearance: She is well-developed. She is obese.  HENT:     Head: Normocephalic.     Right Ear: External ear normal.     Left Ear: External ear normal.     Nose: Nose normal.  Eyes:     General:        Right eye: No discharge.        Left eye: No discharge.     Conjunctiva/sclera: Conjunctivae normal.     Pupils: Pupils are equal, round, and reactive to light.  Neck:     Thyroid: No thyromegaly.     Vascular: No JVD.     Trachea: No tracheal deviation.  Cardiovascular:     Rate and Rhythm: Normal rate and regular rhythm.     Heart sounds: Normal heart sounds.  Pulmonary:     Effort: No respiratory distress.     Breath sounds: No stridor. No wheezing.  Abdominal:     General: Bowel sounds are normal. There is no distension.     Palpations: Abdomen is soft. There is no mass.     Tenderness: There is no abdominal tenderness. There is no guarding or rebound.  Musculoskeletal:  General: No tenderness.     Cervical back: Normal range of motion and neck supple. No rigidity.  Lymphadenopathy:     Cervical: No cervical adenopathy.  Skin:    Findings: No erythema or rash.  Neurological:     Cranial Nerves: No cranial nerve deficit.     Motor: No abnormal muscle tone.     Coordination: Coordination normal.     Deep Tendon Reflexes: Reflexes normal.  Psychiatric:        Behavior: Behavior normal.        Thought Content: Thought content normal.        Judgment: Judgment normal.     Lab Results  Component Value Date   WBC 11.3 (H) 09/14/2021   HGB 13.8 09/14/2021   HCT 41.8 09/14/2021   PLT 306.0 09/14/2021   GLUCOSE 83 07/01/2022   CHOL 278 (H) 07/01/2022   TRIG 137.0 07/01/2022   HDL 54.00 07/01/2022   LDLDIRECT 199.0 10/15/2018   LDLCALC 196 (H) 07/01/2022    ALT 15 07/01/2022   AST 15 07/01/2022   NA 138 07/01/2022   K 4.0 07/01/2022   CL 104 07/01/2022   CREATININE 1.13 07/01/2022   BUN 25 (H) 07/01/2022   CO2 26 07/01/2022   TSH 4.79 07/01/2022   HGBA1C 6.0 07/01/2022   MICROALBUR 39.7 (H) 05/14/2020    No results found.  Assessment & Plan:   Problem List Items Addressed This Visit     Hypothyroidism    Continue levothyroxine Monitor TSH      B12 deficiency    Continue with vitamin B12 injections      Anxiety disorder    Xanax prn  Potential benefits of a long term benzodiazepines  use as well as potential risks  and complications were explained to the patient and were aknowledged.       Relevant Medications   ALPRAZolam (XANAX) 0.25 MG tablet   WEIGHT GAIN - Primary    Start Metformin XR to help to improve metabolism Phentermine 37.5 mg option is discussed  Potential benefits of a long term phentermine use as well as potential risks  and complications were explained to the patient and were aknowledged.          Meds ordered this encounter  Medications   ALPRAZolam (XANAX) 0.25 MG tablet    Sig: Take 1 tablet (0.25 mg total) by mouth 2 (two) times daily as needed.    Dispense:  60 tablet    Refill:  3   metFORMIN (GLUCOPHAGE-XR) 500 MG 24 hr tablet    Sig: Take 1 tablet (500 mg total) by mouth daily with breakfast.    Dispense:  30 tablet    Refill:  11   cyanocobalamin (VITAMIN B12) injection 1,000 mcg      Follow-up: Return in about 3 months (around 05/23/2023) for a follow-up visit.  Sonda Primes, MD

## 2023-02-20 NOTE — Patient Instructions (Signed)
Phentermine 37.5mg 

## 2023-02-26 ENCOUNTER — Encounter: Payer: Self-pay | Admitting: Internal Medicine

## 2023-02-26 NOTE — Assessment & Plan Note (Signed)
Continue with vitamin B12 injections

## 2023-02-26 NOTE — Assessment & Plan Note (Signed)
Continue levothyroxine. Monitor TSH.

## 2023-02-26 NOTE — Assessment & Plan Note (Signed)
Xanax prn  Potential benefits of a long term benzodiazepines  use as well as potential risks  and complications were explained to the patient and were aknowledged. 

## 2023-03-27 ENCOUNTER — Ambulatory Visit: Payer: BC Managed Care – PPO

## 2023-03-27 DIAGNOSIS — E538 Deficiency of other specified B group vitamins: Secondary | ICD-10-CM | POA: Diagnosis not present

## 2023-03-27 MED ORDER — CYANOCOBALAMIN 1000 MCG/ML IJ SOLN
1000.0000 ug | Freq: Once | INTRAMUSCULAR | Status: AC
Start: 2023-03-27 — End: 2023-03-27
  Administered 2023-03-27: 1000 ug via INTRAMUSCULAR

## 2023-03-27 NOTE — Progress Notes (Cosign Needed)
Patient here for monthly B12 injection per Dr. Jones.  B12 1000 mcg given in left IM and patient tolerated injection well today.  

## 2023-03-30 LAB — HM MAMMOGRAPHY

## 2023-03-31 ENCOUNTER — Encounter: Payer: Self-pay | Admitting: Internal Medicine

## 2023-04-05 ENCOUNTER — Ambulatory Visit: Payer: Self-pay | Admitting: Nurse Practitioner

## 2023-04-10 ENCOUNTER — Ambulatory Visit (INDEPENDENT_AMBULATORY_CARE_PROVIDER_SITE_OTHER): Payer: 59 | Admitting: Internal Medicine

## 2023-04-10 ENCOUNTER — Encounter: Payer: Self-pay | Admitting: Internal Medicine

## 2023-04-10 VITALS — BP 128/80 | HR 72 | Temp 97.9°F | Ht 62.0 in | Wt 184.0 lb

## 2023-04-10 DIAGNOSIS — J0100 Acute maxillary sinusitis, unspecified: Secondary | ICD-10-CM

## 2023-04-10 NOTE — Patient Instructions (Addendum)
 Medications changes include :   None      Return if symptoms worsen or fail to improve.    Sinus Infection, Adult A sinus infection, also called sinusitis, is inflammation of your sinuses. Sinuses are hollow spaces in the bones around your face. Your sinuses are located: Around your eyes. In the middle of your forehead. Behind your nose. In your cheekbones. Mucus normally drains out of your sinuses. When your nasal tissues become inflamed or swollen, mucus can become trapped or blocked. This allows bacteria, viruses, and fungi to grow, which leads to infection. Most infections of the sinuses are caused by a virus. A sinus infection can develop quickly. It can last for up to 4 weeks (acute) or for more than 12 weeks (chronic). A sinus infection often develops after a cold. What are the causes? This condition is caused by anything that creates swelling in the sinuses or stops mucus from draining. This includes: Allergies. Asthma. Infection from bacteria or viruses. Deformities or blockages in your nose or sinuses. Abnormal growths in the nose (nasal polyps). Pollutants, such as chemicals or irritants in the air. Infection from fungi. This is rare. What increases the risk? You are more likely to develop this condition if you: Have a weak body defense system (immune system). Do a lot of swimming or diving. Overuse nasal sprays. Smoke. What are the signs or symptoms? The main symptoms of this condition are pain and a feeling of pressure around the affected sinuses. Other symptoms include: Stuffy nose or congestion that makes it difficult to breathe through your nose. Thick yellow or greenish drainage from your nose. Tenderness, swelling, and warmth over the affected sinuses. A cough that may get worse at night. Decreased sense of smell and taste. Extra mucus that collects in the throat or the back of the nose (postnasal drip) causing a sore throat or bad breath. Tiredness  (fatigue). Fever. How is this diagnosed? This condition is diagnosed based on: Your symptoms. Your medical history. A physical exam. Tests to find out if your condition is acute or chronic. This may include: Checking your nose for nasal polyps. Viewing your sinuses using a device that has a light (endoscope). Testing for allergies or bacteria. Imaging tests, such as an MRI or CT scan. In rare cases, a bone biopsy may be done to rule out more serious types of fungal sinus disease. How is this treated? Treatment for a sinus infection depends on the cause and whether your condition is chronic or acute. If caused by a virus, your symptoms should go away on their own within 10 days. You may be given medicines to relieve symptoms. They include: Medicines that shrink swollen nasal passages (decongestants). A spray that eases inflammation of the nostrils (topical intranasal corticosteroids). Rinses that help get rid of thick mucus in your nose (nasal saline washes). Medicines that treat allergies (antihistamines). Over-the-counter pain relievers. If caused by bacteria, your health care provider may recommend waiting to see if your symptoms improve. Most bacterial infections will get better without antibiotic medicine. You may be given antibiotics if you have: A severe infection. A weak immune system. If caused by narrow nasal passages or nasal polyps, surgery may be needed. Follow these instructions at home: Medicines Take, use, or apply over-the-counter and prescription medicines only as told by your health care provider. These may include nasal sprays. If you were prescribed an antibiotic medicine, take it as told by your health care provider. Do not stop taking the  antibiotic even if you start to feel better. Hydrate and humidify  Drink enough fluid to keep your urine pale yellow. Staying hydrated will help to thin your mucus. Use a cool mist humidifier to keep the humidity level in your  home above 50%. Inhale steam for 10-15 minutes, 3-4 times a day, or as told by your health care provider. You can do this in the bathroom while a hot shower is running. Limit your exposure to cool or dry air. Rest Rest as much as possible. Sleep with your head raised (elevated). Make sure you get enough sleep each night. General instructions  Apply a warm, moist washcloth to your face 3-4 times a day or as told by your health care provider. This will help with discomfort. Use nasal saline washes as often as told by your health care provider. Wash your hands often with soap and water to reduce your exposure to germs. If soap and water are not available, use hand sanitizer. Do not smoke. Avoid being around people who are smoking (secondhand smoke). Keep all follow-up visits. This is important. Contact a health care provider if: You have a fever. Your symptoms get worse. Your symptoms do not improve within 10 days. Get help right away if: You have a severe headache. You have persistent vomiting. You have severe pain or swelling around your face or eyes. You have vision problems. You develop confusion. Your neck is stiff. You have trouble breathing. These symptoms may be an emergency. Get help right away. Call 911. Do not wait to see if the symptoms will go away. Do not drive yourself to the hospital. Summary A sinus infection is soreness and inflammation of your sinuses. Sinuses are hollow spaces in the bones around your face. This condition is caused by nasal tissues that become inflamed or swollen. The swelling traps or blocks the flow of mucus. This allows bacteria, viruses, and fungi to grow, which leads to infection. If you were prescribed an antibiotic medicine, take it as told by your health care provider. Do not stop taking the antibiotic even if you start to feel better. Keep all follow-up visits. This is important. This information is not intended to replace advice given to  you by your health care provider. Make sure you discuss any questions you have with your health care provider. Document Revised: 02/16/2021 Document Reviewed: 02/16/2021 Elsevier Patient Education  2024 [employer].

## 2023-04-10 NOTE — Progress Notes (Signed)
 Subjective:    Patient ID: Vanessa [name], female    DOB: 1960-11-14, 63 y.o.   MRN: [id number]      HPI Nevaya is here for  Chief Complaint  Patient presents with   Sinusitis    Started last Wednesday (sore throat, crusty eyes, right ear felt full ). Sinus pressure and headache. Took Tylenol  and mucinex , flonase  and feels better today. Made appointment on last Thursday; No coughing today.    She is here for an acute visit for cold symptoms.   Her symptoms started 5 days ago  She is experiencing Nasal congestion that is mild, ear pain mostly on the right side, PND, sinus pressure under her eyes, sore throat on the right side of her throat which has improved and a dry cough.  She really only had 1 episode of dry cough when she had a tickle in his throat at night which she assumed was drainage.  She denies any fevers, shortness of breath, wheeze and headaches.  She had 1 mild episode of dizziness.  She denies any other symptoms.  She has tried taking tylenol , mucinex -dm, zyrtec, flonase   Feeling better today.       Medications and allergies reviewed with patient and updated if appropriate.  Current Outpatient Medications on File Prior to Visit  Medication Sig Dispense Refill   ALPRAZolam  (XANAX ) 0.25 MG tablet Take 1 tablet (0.25 mg total) by mouth 2 (two) times daily as needed. 60 tablet 3   Cyanocobalamin  (B-12 COMPLIANCE INJECTION IJ) Inject as directed. Every 30 days     Cyanocobalamin  (VITAMIN B-12) 5000 MCG SUBL Place under the tongue.     levothyroxine  (SYNTHROID ) 88 MCG tablet Take 1 tablet (88 mcg total) by mouth daily. 90 tablet 3   metFORMIN  (GLUCOPHAGE -XR) 500 MG 24 hr tablet Take 1 tablet (500 mg total) by mouth daily with breakfast. 30 tablet 11   No current facility-administered medications on file prior to visit.    Review of Systems  Constitutional:  Negative for chills and fever.  HENT:  Positive for congestion (mild), ear pain (achy - R > L),  postnasal drip, sinus pressure (under eyes) and sore throat (on right side - improved).   Eyes:        Eyes puffy, crusty in morning  Respiratory:  Positive for cough (dry - related to pnd -one episode). Negative for shortness of breath and wheezing.   Neurological:  Positive for dizziness (one episode). Negative for headaches.       Objective:   Vitals:   04/10/23 1007  BP: 128/80  Pulse: 72  Temp: 97.9 F (36.6 C)  SpO2: 97%   BP Readings from Last 3 Encounters:  04/10/23 128/80  02/20/23 118/78  10/14/22 (!) 160/100   Wt Readings from Last 3 Encounters:  04/10/23 184 lb (83.5 kg)  02/20/23 186 lb (84.4 kg)  10/14/22 182 lb (82.6 kg)   Body mass index is 33.65 kg/m.    Physical Exam Constitutional:      General: She is not in acute distress.    Appearance: Normal appearance. She is not ill-appearing.  HENT:     Head: Normocephalic and atraumatic.     Right Ear: Tympanic membrane, ear canal and external ear normal.     Left Ear: Tympanic membrane, ear canal and external ear normal.     Mouth/Throat:     Mouth: Mucous membranes are moist.     Pharynx: No oropharyngeal exudate or posterior oropharyngeal erythema.  Eyes:     Conjunctiva/sclera: Conjunctivae normal.  Cardiovascular:     Rate and Rhythm: Normal rate and regular rhythm.  Pulmonary:     Effort: Pulmonary effort is normal. No respiratory distress.     Breath sounds: Normal breath sounds. No wheezing or rales.  Musculoskeletal:     Cervical back: Neck supple. No tenderness.  Lymphadenopathy:     Cervical: No cervical adenopathy.  Skin:    General: Skin is warm and dry.  Neurological:     Mental Status: She is alert.            Assessment & Plan:    Sinusitis: Acute Likely viral in nature No need for an antibiotic Symptoms have not proved some Continue over-the-counter medications-Flonase , Zyrtec, Tylenol  as needed Call if symptoms worsen or do not improve/resolve

## 2023-05-01 ENCOUNTER — Ambulatory Visit (INDEPENDENT_AMBULATORY_CARE_PROVIDER_SITE_OTHER): Payer: 59

## 2023-05-01 ENCOUNTER — Ambulatory Visit: Payer: BC Managed Care – PPO

## 2023-05-01 DIAGNOSIS — E538 Deficiency of other specified B group vitamins: Secondary | ICD-10-CM

## 2023-05-01 MED ORDER — CYANOCOBALAMIN 1000 MCG/ML IJ SOLN
1000.0000 ug | Freq: Once | INTRAMUSCULAR | Status: AC
  Administered 2023-05-01: 1000 ug via INTRAMUSCULAR

## 2023-05-01 NOTE — Progress Notes (Cosign Needed Addendum)
After obtaining consent, and per orders of Dr. Plotnikov, injection of B12 given by Rabab Currington P Cache Decoursey. Patient instructed to report any adverse reaction to me immediately.  Medical screening examination/treatment/procedure(s) were performed by non-physician practitioner and as supervising physician I was immediately available for consultation/collaboration.  I agree with above. Aleksei Plotnikov, MD  

## 2023-05-29 ENCOUNTER — Ambulatory Visit (INDEPENDENT_AMBULATORY_CARE_PROVIDER_SITE_OTHER): Payer: 59

## 2023-05-29 DIAGNOSIS — E538 Deficiency of other specified B group vitamins: Secondary | ICD-10-CM

## 2023-05-29 MED ORDER — CYANOCOBALAMIN 1000 MCG/ML IJ SOLN
1000.0000 ug | Freq: Once | INTRAMUSCULAR | Status: AC
Start: 2023-05-29 — End: 2023-05-29
  Administered 2023-05-29: 1000 ug via INTRAMUSCULAR

## 2023-05-29 NOTE — Progress Notes (Signed)
 Patient visits today for their b-12 injection. Patient informed of what they had received and tolerated the injection well. Patient notified to reach out to the office if needed.

## 2023-07-03 ENCOUNTER — Ambulatory Visit (INDEPENDENT_AMBULATORY_CARE_PROVIDER_SITE_OTHER)

## 2023-07-03 DIAGNOSIS — E538 Deficiency of other specified B group vitamins: Secondary | ICD-10-CM | POA: Diagnosis not present

## 2023-07-03 MED ORDER — CYANOCOBALAMIN 1000 MCG/ML IJ SOLN
1000.0000 ug | Freq: Once | INTRAMUSCULAR | Status: AC
  Administered 2023-07-03: 1000 ug via INTRAMUSCULAR

## 2023-07-03 NOTE — Progress Notes (Cosign Needed Addendum)
After obtaining consent, and per orders of Dr. Plotnikov, injection of B12 given by Rabab Currington P Cache Decoursey. Patient instructed to report any adverse reaction to me immediately.  Medical screening examination/treatment/procedure(s) were performed by non-physician practitioner and as supervising physician I was immediately available for consultation/collaboration.  I agree with above. Aleksei Plotnikov, MD  

## 2023-07-26 ENCOUNTER — Ambulatory Visit: Payer: Self-pay | Admitting: Nurse Practitioner

## 2023-07-28 ENCOUNTER — Telehealth: Payer: Self-pay | Admitting: Internal Medicine

## 2023-07-28 NOTE — Telephone Encounter (Signed)
 Copied from CRM [id number]. Topic: General - Other >> Jul 28, 2023 11:23 AM Jenice Mitts wrote: Reason for CRM:  Patient is calling because she is out of her Alprazolam  and would like to see if it can be processed quickly

## 2023-07-31 ENCOUNTER — Telehealth: Payer: Self-pay | Admitting: Internal Medicine

## 2023-07-31 NOTE — Telephone Encounter (Signed)
 Requesting to speak with a nurse in regards to being having abdominal pain.   Please advise.

## 2023-07-31 NOTE — Telephone Encounter (Signed)
 Complains of a sour stomach with burning into her esophagus and pain under her breast. Used a heating pad and was able to relax. Started taking OTC omeprazole. This has helped enough that she can tell a difference in how she feels. She has also added a stool softener for new onset of constipation. Appointment scheduled. Applauded her efforts. Brief discussion of Anti-reflux measures.

## 2023-07-31 NOTE — Telephone Encounter (Signed)
It has been done. Thanks

## 2023-08-01 ENCOUNTER — Encounter: Payer: Self-pay | Admitting: Internal Medicine

## 2023-08-01 ENCOUNTER — Ambulatory Visit (INDEPENDENT_AMBULATORY_CARE_PROVIDER_SITE_OTHER): Admitting: Internal Medicine

## 2023-08-01 VITALS — BP 126/82 | HR 82 | Temp 97.9°F | Ht 62.0 in | Wt 179.4 lb

## 2023-08-01 DIAGNOSIS — E785 Hyperlipidemia, unspecified: Secondary | ICD-10-CM | POA: Diagnosis not present

## 2023-08-01 DIAGNOSIS — K219 Gastro-esophageal reflux disease without esophagitis: Secondary | ICD-10-CM | POA: Diagnosis not present

## 2023-08-01 DIAGNOSIS — K5904 Chronic idiopathic constipation: Secondary | ICD-10-CM | POA: Diagnosis not present

## 2023-08-01 DIAGNOSIS — E538 Deficiency of other specified B group vitamins: Secondary | ICD-10-CM

## 2023-08-01 DIAGNOSIS — R1084 Generalized abdominal pain: Secondary | ICD-10-CM | POA: Insufficient documentation

## 2023-08-01 DIAGNOSIS — K5900 Constipation, unspecified: Secondary | ICD-10-CM | POA: Insufficient documentation

## 2023-08-01 MED ORDER — PANTOPRAZOLE SODIUM 40 MG PO TBEC: 40.0000 mg | DELAYED_RELEASE_TABLET | Freq: Every day | ORAL | 1 refills | Status: DC

## 2023-08-01 MED ORDER — CYANOCOBALAMIN 1000 MCG/ML IJ SOLN
1000.0000 ug | Freq: Once | INTRAMUSCULAR | Status: AC
Start: 2023-08-01 — End: 2023-08-01
  Administered 2023-08-01: 1000 ug via INTRAMUSCULAR

## 2023-08-01 NOTE — Progress Notes (Signed)
 Subjective:  Patient ID: Vanessa [name], female    DOB: Aug 11, 1960  Age: 63 y.o. MRN: [id number]  CC: Gastroesophageal Reflux (Acid reflux, gastritis, bloating (hard to the touch), constipation since last Thursday. Treating with alka seltzer chews, stool softener (was able to have a bowel movement Monday), heating pad, and omeprazole (worked well for patient, last taken yesterday))   HPI Vanessa [name] presents for Acid reflux sx's, gastritis, bloating (hard to the touch), pain, belching, burping and some constipation since last Thursday.  This symptoms were sudden and severe.  Treating with alka seltzer chews, stool softener (was able to have a bowel movement Monday), heating pad, and omeprazole (worked well for patient, last taken yesterday. Eats out every day, fast food primarily.  Feeling much better today  Outpatient Medications Prior to Visit  Medication Sig Dispense Refill   ALPRAZolam  (XANAX ) 0.25 MG tablet Take 1 tablet by mouth twice daily as needed 60 tablet 0   Cyanocobalamin  (B-12 COMPLIANCE INJECTION IJ) Inject as directed. Every 30 days     Cyanocobalamin  (VITAMIN B-12) 5000 MCG SUBL Place under the tongue.     levothyroxine  (SYNTHROID ) 88 MCG tablet Take 1 tablet (88 mcg total) by mouth daily. 90 tablet 3   metFORMIN  (GLUCOPHAGE -XR) 500 MG 24 hr tablet Take 1 tablet (500 mg total) by mouth daily with breakfast. 30 tablet 11   No facility-administered medications prior to visit.    ROS: Review of Systems  Constitutional:  Negative for activity change, appetite change, chills, fatigue and unexpected weight change.  HENT:  Negative for congestion, mouth sores and sinus pressure.   Eyes:  Negative for visual disturbance.  Respiratory:  Negative for cough and chest tightness.   Gastrointestinal:  Negative for abdominal pain and nausea.  Genitourinary:  Negative for difficulty urinating, frequency and vaginal pain.  Musculoskeletal:  Negative for back pain and gait problem.   Skin:  Negative for pallor and rash.  Neurological:  Negative for dizziness, tremors, weakness, numbness and headaches.  Psychiatric/Behavioral:  Negative for confusion, sleep disturbance and suicidal ideas.     Objective:  BP 126/82   Pulse 82   Temp 97.9 F (36.6 C)   Ht 5\' 2"  (1.575 m)   Wt 179 lb 6.4 oz (81.4 kg)   LMP 03/08/2006   SpO2 97%   BMI 32.81 kg/m   BP Readings from Last 3 Encounters:  08/01/23 126/82  04/10/23 128/80  02/20/23 118/78    Wt Readings from Last 3 Encounters:  08/01/23 179 lb 6.4 oz (81.4 kg)  04/10/23 184 lb (83.5 kg)  02/20/23 186 lb (84.4 kg)    Physical Exam Constitutional:      General: She is not in acute distress.    Appearance: She is well-developed. She is obese.  HENT:     Head: Normocephalic.     Right Ear: External ear normal.     Left Ear: External ear normal.     Nose: Nose normal.  Eyes:     General:        Right eye: No discharge.        Left eye: No discharge.     Conjunctiva/sclera: Conjunctivae normal.     Pupils: Pupils are equal, round, and reactive to light.  Neck:     Thyroid : No thyromegaly.     Vascular: No JVD.     Trachea: No tracheal deviation.  Cardiovascular:     Rate and Rhythm: Normal rate and regular rhythm.  Heart sounds: Normal heart sounds.  Pulmonary:     Effort: No respiratory distress.     Breath sounds: No stridor. No wheezing.  Abdominal:     General: Bowel sounds are normal. There is no distension.     Palpations: Abdomen is soft. There is no mass.     Tenderness: There is no abdominal tenderness. There is no guarding or rebound.  Musculoskeletal:        General: No tenderness.     Cervical back: Normal range of motion and neck supple. No rigidity.  Lymphadenopathy:     Cervical: No cervical adenopathy.  Skin:    Findings: No erythema or rash.  Neurological:     Cranial Nerves: No cranial nerve deficit.     Motor: No abnormal muscle tone.     Coordination: Coordination  normal.     Deep Tendon Reflexes: Reflexes normal.  Psychiatric:        Behavior: Behavior normal.        Thought Content: Thought content normal.        Judgment: Judgment normal.     Lab Results  Component Value Date   WBC 11.3 (H) 09/14/2021   HGB 13.8 09/14/2021   HCT 41.8 09/14/2021   PLT 306.0 09/14/2021   GLUCOSE 83 07/01/2022   CHOL 278 (H) 07/01/2022   TRIG 137.0 07/01/2022   HDL 54.00 07/01/2022   LDLDIRECT 199.0 10/15/2018   LDLCALC 196 (H) 07/01/2022   ALT 15 07/01/2022   AST 15 07/01/2022   NA 138 07/01/2022   K 4.0 07/01/2022   CL 104 07/01/2022   CREATININE 1.13 07/01/2022   BUN 25 (H) 07/01/2022   CO2 26 07/01/2022   TSH 4.79 07/01/2022   HGBA1C 6.0 07/01/2022   MICROALBUR 39.7 (H) 05/14/2020    No results found.  Assessment & Plan:   Problem List Items Addressed This Visit     B12 deficiency   Continue with vitamin B12 injections      Dyslipidemia   Obtain lab work -lipids A cardiac CT scan for coronary calcium  offered again      GERD (gastroesophageal reflux disease) - Primary   Worse over past several days.  Better now Start Protonix  40 mg/d  EGD w/Dr Willy Harvest if not better Primarily epigastric abdominal pain of sudden onset with severe indigestion.  Obtain labs including CBC and c-Met. Obtain abd US  to rule out gallstones. Stop eating out all the time.  Low-fat diet for now.  No fried food. Go to ER if worse      Relevant Medications   pantoprazole  (PROTONIX ) 40 MG tablet   Other Relevant Orders   CBC with Differential/Platelet   Comprehensive metabolic panel with GFR   Constipation   Colace qd Eat at home EGD w/Dr Willy Harvest if not better      Generalized abdominal pain   Primarily epigastric abdominal pain of sudden onset with severe indigestion.  Obtain labs including CBC and c-Met. Obtain abd US  to rule out gallstones. Stop eating out all the time.  Low-fat diet for now.  No fried food. Go to ER if worse      Relevant  Orders   CBC with Differential/Platelet   Comprehensive metabolic panel with GFR   US  Abdomen Complete      Meds ordered this encounter  Medications   cyanocobalamin  (VITAMIN B12) injection 1,000 mcg   pantoprazole  (PROTONIX ) 40 MG tablet    Sig: Take 1 tablet (40 mg total) by mouth daily.  Dispense:  90 tablet    Refill:  1      Follow-up: Return in about 6 weeks (around 09/12/2023) for a follow-up visit.  Anitra Barn, MD

## 2023-08-01 NOTE — Assessment & Plan Note (Signed)
Continue with vitamin B12 injections

## 2023-08-01 NOTE — Assessment & Plan Note (Addendum)
 Worse over past several days.  Better now Start Protonix  40 mg/d  EGD w/Dr Willy Harvest if not better Primarily epigastric abdominal pain of sudden onset with severe indigestion.  Obtain labs including CBC and c-Met. Obtain abd US  to rule out gallstones. Stop eating out all the time.  Low-fat diet for now.  No fried food. Go to ER if worse

## 2023-08-01 NOTE — Assessment & Plan Note (Signed)
 Obtain lab work -lipids A cardiac CT scan for coronary calcium  offered again

## 2023-08-01 NOTE — Assessment & Plan Note (Addendum)
 Primarily epigastric abdominal pain of sudden onset with severe indigestion.  Obtain labs including CBC and c-Met. Obtain abd US  to rule out gallstones. Stop eating out all the time.  Low-fat diet for now.  No fried food. Go to ER if worse

## 2023-08-01 NOTE — Patient Instructions (Addendum)
[facility] Emergency room  Address: [address] [place], [place] [address] Hours: Open 24 hours Phone: [phone]   [facility] Address: [address] [place], [place] [address] Hours: Open 24 hours Phone: [phone]   Cardiac CT calcium scoring test $99    Computed tomography, more commonly known as a CT or CAT scan, is a diagnostic medical imaging test. Like traditional x-rays, it produces multiple images or pictures of the inside of the body. The cross-sectional images generated during a CT scan can be reformatted in multiple planes. They can even generate three-dimensional images. These images can be viewed on a computer monitor. CT images of internal organs, bones, soft tissue and blood vessels provide greater detail than traditional x-rays, particularly of soft tissues and blood vessels. A cardiac CT scan for coronary calcium is a non-invasive way of obtaining information about the presence, location and extent of calcified plaque in the coronary arteries--the vessels that supply oxygen-containing blood to the heart muscle. Calcified plaque results when there is a build-up of fat and other substances under the inner layer of the artery. This material can calcify which signals the presence of atherosclerosis, a disease of the vessel wall, also called coronary artery disease (CAD). People with this disease have an increased risk for heart attacks. In addition, over time, progression of plaque build up (CAD) can narrow the arteries or even close off blood flow to the heart. The result may be chest pain, sometimes called "angina," or a heart attack. Because calcium is a marker of CAD, the amount of calcium detected on a cardiac CT scan is a helpful prognostic tool. The findings on cardiac CT are expressed as a calcium score. Another name for this test is coronary artery calcium scoring.  What are some common uses  of the procedure? The goal of cardiac CT scan for calcium scoring is to determine if CAD is present and to what extent, even if there are no symptoms. It is a screening study that may be recommended by a physician for patients with risk factors for CAD but no clinical symptoms. The major risk factors for CAD are: high blood cholesterol levels  family history of heart attacks  diabetes  high blood pressure  cigarette smoking  overweight or obese  physical inactivity   A negative cardiac CT scan for calcium scoring shows no calcification within the coronary arteries. This suggests that CAD is absent or so minimal it cannot be seen by this technique. The chance of having a heart attack over the next two to five years is very low under these circumstances. A positive test means that CAD is present, regardless of whether or not the patient is experiencing any symptoms. The amount of calcification--expressed as the calcium score--may help to predict the likelihood of a myocardial infarction (heart attack) in the coming years and helps your medical doctor or cardiologist decide whether the patient may need to take preventive medicine or undertake other measures such as diet and exercise to lower the risk for heart attack. The extent of CAD is graded according to your calcium score:  Calcium Score  Presence of CAD (coronary artery disease)  0 No evidence of CAD   1-10 Minimal evidence of CAD  11-100 Mild evidence of CAD  101-400 Moderate evidence of CAD  Over 400 Extensive evidence of CAD   Coronary artery calcium (CAC) score is a strong predictor of incident coronary heart disease (  CHD) and provides predictive information beyond traditional risk factors. CAC scoring is reasonable to use in the decision to withhold, postpone, or initiate statin therapy in intermediate-risk or selected borderline-risk asymptomatic adults (age 30-75 years and LDL-C >=70 to <190 mg/dL) who do not have diabetes or  established atherosclerotic cardiovascular disease (ASCVD).* In intermediate-risk (10-year ASCVD risk >=7.5% to <20%) adults or selected borderline-risk (10-year ASCVD risk >=5% to <7.5%) adults in whom a CAC score is measured for the purpose of making a treatment decision the following recommendations have been made:   If CAC=0, it is reasonable to withhold statin therapy and reassess in 5 to 10 years, as long as higher risk conditions are absent (diabetes mellitus, family history of premature CHD in first degree relatives (males <55 years; females <65 years), cigarette smoking, or LDL >=190 mg/dL).   If CAC is 1 to 99, it is reasonable to initiate statin therapy for patients >=25 years of age.   If CAC is >=100 or >=75th percentile, it is reasonable to initiate statin therapy at any age.   Cardiology referral should be considered for patients with CAC scores >=400 or >=75th percentile.   *2018 AHA/ACC/AACVPR/AAPA/ABC/ACPM/ADA/AGS/APhA/ASPC/NLA/PCNA Guideline on the Management of Blood Cholesterol: A Report of the American College of Cardiology/American Heart Association Task Force on Clinical Practice Guidelines. J Am Coll Cardiol. 2019;73(24):3168-3209.

## 2023-08-01 NOTE — Assessment & Plan Note (Signed)
 Colace qd Eat at home EGD w/Dr Willy Harvest if not better

## 2023-08-02 ENCOUNTER — Ambulatory Visit: Admitting: Family Medicine

## 2023-08-03 ENCOUNTER — Ambulatory Visit

## 2023-08-08 ENCOUNTER — Ambulatory Visit: Admitting: Internal Medicine

## 2023-08-11 ENCOUNTER — Ambulatory Visit: Admitting: Gastroenterology

## 2023-08-14 ENCOUNTER — Other Ambulatory Visit: Payer: Self-pay | Admitting: Internal Medicine

## 2023-08-17 ENCOUNTER — Ambulatory Visit
Admission: RE | Admit: 2023-08-17 | Discharge: 2023-08-17 | Disposition: A | Discharge status: Home / Self Care | Source: Ambulatory Visit | Attending: Internal Medicine | Admitting: Internal Medicine

## 2023-08-17 ENCOUNTER — Ambulatory Visit: Payer: Self-pay | Admitting: Internal Medicine

## 2023-08-17 ENCOUNTER — Encounter: Payer: Self-pay | Admitting: Internal Medicine

## 2023-09-04 ENCOUNTER — Encounter: Payer: Self-pay | Admitting: Internal Medicine

## 2023-09-04 ENCOUNTER — Ambulatory Visit (INDEPENDENT_AMBULATORY_CARE_PROVIDER_SITE_OTHER)

## 2023-09-04 ENCOUNTER — Telehealth: Payer: Self-pay | Admitting: Internal Medicine

## 2023-09-04 DIAGNOSIS — E538 Deficiency of other specified B group vitamins: Secondary | ICD-10-CM

## 2023-09-04 MED ORDER — CYANOCOBALAMIN 1000 MCG/ML IJ SOLN
1000.0000 ug | Freq: Once | INTRAMUSCULAR | Status: AC
  Administered 2023-09-04: 1000 ug via INTRAMUSCULAR

## 2023-09-04 NOTE — Telephone Encounter (Signed)
 Copied from CRM [id number]. Topic: Clinical - Prescription Issue >> Sep 04, 2023  9:05 AM Crispin Dolphin wrote: Reason for CRM: Patient called - states pharmacy told her reach out to get Rx refill sent in. Pharmacy already put in refill request today for ALPRAZolam  (XANAX ) 0.25 MG tablet. Thank You

## 2023-09-04 NOTE — Progress Notes (Addendum)
 Patient visits today for their b-12 injection. Patient informed of what they had received and tolerated injection well. Patient notified to reach out to office if needed.   Medical screening examination/treatment/procedure(s) were performed by non-physician practitioner and as supervising physician I was immediately available for consultation/collaboration.  I agree with above. Jacinta Shoe, MD

## 2023-09-05 ENCOUNTER — Other Ambulatory Visit: Payer: Self-pay | Admitting: Internal Medicine

## 2023-09-05 NOTE — Telephone Encounter (Unsigned)
 Copied from CRM [id number]. Topic: Clinical - Medication Refill >> Sep 05, 2023  9:02 AM Alyse July wrote: Medication: ALPRAZolam  (XANAX ) 0.25 MG tablet   Has the patient contacted their pharmacy? Yes  This is the patient's preferred pharmacy:  [facility] [address], [place] - [address] [place] [address] Phone: [phone] Fax: [phone]   Is this the correct pharmacy for this prescription? Yes If no, delete pharmacy and type the correct one.   Has the prescription been filled recently? No  Is the patient out of the medication? Yes  Has the patient been seen for an appointment in the last year OR does the patient have an upcoming appointment? Yes  Can we respond through MyChart? Yes  Agent: Please be advised that Rx refills may take up to 3 business days. We ask that you follow-up with your pharmacy.

## 2023-09-05 NOTE — Telephone Encounter (Signed)
 Name of Medication: Alprazolam  (Xanax ) 0.25mg  Name of Pharmacy: [facility] [address] [place], [place] - [address]  Last Fill or Written Date and Quantity: 07/28/23 60tabs 0refills Last Office Visit and Type: 08/01/23 for Reflux  Next Office Visit and Type: 10/04/23 for follow up Last Controlled Substance Agreement Date: none Last UDS: none

## 2023-09-06 ENCOUNTER — Ambulatory Visit: Admitting: Emergency Medicine

## 2023-09-06 ENCOUNTER — Encounter: Payer: Self-pay | Admitting: Emergency Medicine

## 2023-09-06 VITALS — BP 132/90 | HR 86 | Temp 99.2°F | Ht 62.0 in | Wt 179.0 lb

## 2023-09-06 DIAGNOSIS — J0100 Acute maxillary sinusitis, unspecified: Secondary | ICD-10-CM

## 2023-09-06 DIAGNOSIS — R6889 Other general symptoms and signs: Secondary | ICD-10-CM | POA: Diagnosis not present

## 2023-09-06 MED ORDER — PSEUDOEPHEDRINE-GUAIFENESIN ER 60-600 MG PO TB12: 1.0000 | ORAL_TABLET | Freq: Two times a day (BID) | ORAL | 0 refills | Status: AC

## 2023-09-06 MED ORDER — AMOXICILLIN-POT CLAVULANATE 875-125 MG PO TABS: 1.0000 | ORAL_TABLET | Freq: Two times a day (BID) | ORAL | 0 refills | Status: AC

## 2023-09-06 NOTE — Assessment & Plan Note (Signed)
 Upper viral respiratory infection now with secondary bacterial infections Recommend to start Augmentin  875 mg twice a day for 7 days. Symptom management discussed Advised to rest and stay well-hydrated Advised to contact the office if no better or worse during the next several days

## 2023-09-06 NOTE — Assessment & Plan Note (Signed)
 Clinically stable.  No red flag signs or symptoms Symptom management discussed Advised to start Mucinex D every 12 hours Advised to rest and stay well-hydrated Advised to contact the office if no better or worse during the next several days.

## 2023-09-06 NOTE — Patient Instructions (Signed)

## 2023-09-06 NOTE — Telephone Encounter (Signed)
 Okay.  Thanks.

## 2023-09-06 NOTE — Progress Notes (Signed)
 Vanessa [name] [id number] 63 y.o.   Chief Complaint  Patient presents with   Acute Visit    Started about 2 weeks ago, nasal congestion, post nasal drip, dry cough, eye pressures, headache and scratchy throat. Has tried Claritin , Zyrtec, Mucinex, Tylenol  and eye drops for itchy eyes    HISTORY OF PRESENT ILLNESS: This is a 63 y.o. female complaining of flulike symptoms that started about 2 weeks ago Progressively getting worse. Complaining of nasal congestion, postnasal drip, dry cough, bilateral eye pressures, headache, scratchy throat.  Over-the-counter medications not helping. No other complaints or medical concerns today.  HPI   Prior to Admission medications   Medication Sig Start Date End Date Taking? Authorizing Provider  ALPRAZolam  (XANAX ) 0.25 MG tablet Take 1 tablet by mouth twice daily as needed 09/06/23  Yes Plotnikov, Aleksei V, MD  Cyanocobalamin  (B-12 COMPLIANCE INJECTION IJ) Inject as directed. Every 30 days   Yes [provider]  Cyanocobalamin  (VITAMIN B-12) 5000 MCG SUBL Place under the tongue.   Yes [provider]  levothyroxine  (SYNTHROID ) 88 MCG tablet Take 1 tablet by mouth once daily 08/14/23  Yes Plotnikov, Aleksei V, MD  metFORMIN  (GLUCOPHAGE -XR) 500 MG 24 hr tablet Take 1 tablet (500 mg total) by mouth daily with breakfast. 02/20/23  Yes Plotnikov, Aleksei V, MD  pantoprazole  (PROTONIX ) 40 MG tablet Take 1 tablet (40 mg total) by mouth daily. 08/01/23  Yes Plotnikov, Oakley Bellman, MD    Allergies  Allergen Reactions   Atorvastatin     REACTION: aches   Crestor  [Rosuvastatin  Calcium ]     achy   Ezetimibe-Simvastatin     REACTION: hives   Hctz [Hydrochlorothiazide ]     incontinence   Hydrocodone  Bit-Homatrop Mbr Itching    Patient Active Problem List   Diagnosis Date Noted   Constipation 08/01/2023   Generalized abdominal pain 08/01/2023   COVID-19 10/14/2022   Hyperglycemia 07/04/2022   Seasonal allergies 07/04/2022   Right otitis media  03/15/2022   Flu-like symptoms 03/15/2022   Sinus headache 03/15/2022   Sinus congestion 03/15/2022   Eustachian tube dysfunction, left 11/05/2021   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   History of COVID-19 09/02/2021   Class 1 obesity with serious comorbidity and body mass index (BMI) of 32.0 to 32.9 in adult 10/28/2020   GERD (gastroesophageal reflux disease) 10/21/2020   Proteinuria 05/19/2020   Renal insufficiency 05/19/2020   Menopause 03/25/2016   Hypertension, renal disease, stage 1-4 or unspecified chronic kidney disease 07/21/2015   Vitamin D  deficiency 07/21/2015   Onychomycosis 04/09/2014   Current smoker 06/26/2013   PSORIASIS 02/02/2010   B12 deficiency 02/10/2009   Anxiety disorder 08/11/2008   DEPRESSION 08/11/2008   INSOMNIA, PERSISTENT 02/14/2008   FATIGUE 02/14/2008   TOBACCO USE DISORDER/SMOKER-SMOKING CESSATION DISCUSSED 06/08/2007   Hypothyroidism 03/07/2007   Allergic rhinitis 03/07/2007   WEIGHT GAIN 03/07/2007   Dyslipidemia 10/27/2006    Past Medical History:  Diagnosis Date   Allergy    rhinitis   Anxiety    B12 deficiency    Cyst    gangeous- left hand   Decreased kidney fxn    Depression    Hyperlipidemia    Hypothyroidism    Hypothyroidism    Tobacco abuse    Vitamin D  deficiency 11/2017   Value 21    Past Surgical History:  Procedure Laterality Date   CESAREAN SECTION  1992   EYE SURGERY     GANGLION CYST EXCISION  2009   right ankle  Social History   Socioeconomic History   Marital status: Married    Spouse name: Not on file   Number of children: Not on file   Years of education: Not on file   Highest education level: Not on file  Occupational History   Occupation: Air traffic controller  Tobacco Use   Smoking status: Every Day    Current packs/day: 0.25    Types: Cigarettes   Smokeless tobacco: Never  Vaping Use   Vaping status: Never Used  Substance and Sexual Activity   Alcohol use: Yes    Comment: occ   Drug use:  Never   Sexual activity: Yes    Birth control/protection: Post-menopausal    Comment: 1st intercourse 63 yo-Fewer than 5 partners  Other Topics Concern   Not on file  Social History Narrative   Not on file   Social Drivers of Health   Financial Resource Strain: Not on file  Food Insecurity: Not on file  Transportation Needs: Not on file  Physical Activity: Not on file  Stress: Not on file  Social Connections: Not on file  Intimate Partner Violence: Not on file    Family History  Problem Relation Age of Onset   Thyroid  disease Mother        Low   Other Mother        B12 deficiency   Diabetes Mother    Hyperlipidemia Mother    Heart disease Father 18       CAD   Hyperlipidemia Father    Heart failure Father    Sudden death Father    Psoriasis Son    Coronary artery disease Other    Hyperlipidemia Other    Colon cancer Neg Hx    Colon polyps Neg Hx    Esophageal cancer Neg Hx    Rectal cancer Neg Hx    Stomach cancer Neg Hx      Review of Systems  Constitutional:  Positive for malaise/fatigue. Negative for chills and fever.  HENT:  Positive for congestion and sore throat.   Respiratory:  Positive for cough.   Cardiovascular: Negative.  Negative for chest pain and palpitations.  Gastrointestinal:  Negative for abdominal pain, diarrhea, nausea and vomiting.  Genitourinary: Negative.  Negative for dysuria and hematuria.  Skin: Negative.  Negative for rash.  Neurological: Negative.  Negative for dizziness and headaches.  All other systems reviewed and are negative.   Vitals:   09/06/23 1502  BP: (!) 132/90  Pulse: 86  Temp: 99.2 F (37.3 C)  SpO2: 95%    Physical Exam Vitals reviewed.  Constitutional:      Appearance: Normal appearance.  HENT:     Head: Normocephalic.     Right Ear: Tympanic membrane, ear canal and external ear normal.     Left Ear: Tympanic membrane, ear canal and external ear normal.     Mouth/Throat:     Mouth: Mucous membranes  are moist.     Pharynx: Oropharynx is clear.  Eyes:     Extraocular Movements: Extraocular movements intact.     Conjunctiva/sclera: Conjunctivae normal.     Pupils: Pupils are equal, round, and reactive to light.  Cardiovascular:     Rate and Rhythm: Normal rate and regular rhythm.     Pulses: Normal pulses.     Heart sounds: Normal heart sounds.  Pulmonary:     Effort: Pulmonary effort is normal.     Breath sounds: Normal breath sounds.  Musculoskeletal:  Cervical back: No tenderness.  Lymphadenopathy:     Cervical: No cervical adenopathy.  Skin:    General: Skin is warm and dry.     Capillary Refill: Capillary refill takes less than 2 seconds.  Neurological:     General: No focal deficit present.     Mental Status: She is alert and oriented to person, place, and time.  Psychiatric:        Mood and Affect: Mood normal.        Behavior: Behavior normal.      ASSESSMENT & PLAN: A total of 33 minutes was spent with the patient and counseling/coordination of care regarding preparing for this visit, review of most recent office visit notes, review of chronic medical conditions under management, review of all medications, diagnosis of acute sinus bacterial infection and need for antibiotics, symptom management, prognosis, documentation and need for follow-up if no better or worse during the next several days.  Problem List Items Addressed This Visit       Respiratory   Acute non-recurrent maxillary sinusitis - Primary   Upper viral respiratory infection now with secondary bacterial infections Recommend to start Augmentin  875 mg twice a day for 7 days. Symptom management discussed Advised to rest and stay well-hydrated Advised to contact the office if no better or worse during the next several days      Relevant Medications   amoxicillin -clavulanate (AUGMENTIN ) 875-125 MG tablet   pseudoephedrine -guaifenesin (MUCINEX D) 60-600 MG 12 hr tablet     Other   Flu-like  symptoms   Clinically stable.  No red flag signs or symptoms Symptom management discussed Advised to start Mucinex D every 12 hours Advised to rest and stay well-hydrated Advised to contact the office if no better or worse during the next several days.      Relevant Medications   pseudoephedrine -guaifenesin (MUCINEX D) 60-600 MG 12 hr tablet   Patient Instructions  Sinus Infection, Adult A sinus infection is soreness and swelling (inflammation) of your sinuses. Sinuses are hollow spaces in the bones around your face. They are located: Around your eyes. In the middle of your forehead. Behind your nose. In your cheekbones. Your sinuses and nasal passages are lined with a fluid called mucus. Mucus drains out of your sinuses. Swelling can trap mucus in your sinuses. This lets germs (bacteria, virus, or fungus) grow, which leads to infection. Most of the time, this condition is caused by a virus. What are the causes? Allergies. Asthma. Germs. Things that block your nose or sinuses. Growths in the nose (nasal polyps). Chemicals or irritants in the air. A fungus. This is rare. What increases the risk? Having a weak body defense system (immune system). Doing a lot of swimming or diving. Using nasal sprays too much. Smoking. What are the signs or symptoms? The main symptoms of this condition are pain and a feeling of pressure around the sinuses. Other symptoms include: Stuffy nose (congestion). This may make it hard to breathe through your nose. Runny nose (drainage). Soreness, swelling, and warmth in the sinuses. A cough that may get worse at night. Being unable to smell and taste. Mucus that collects in the throat or the back of the nose (postnasal drip). This may cause a sore throat or bad breath. Being very tired (fatigued). A fever. How is this diagnosed? Your symptoms. Your medical history. A physical exam. Tests to find out if your condition is short-term (acute) or  long-term (chronic). Your doctor may: Check your nose for  growths (polyps). Check your sinuses using a tool that has a light on one end (endoscope). Check for allergies or germs. Do imaging tests, such as an MRI or CT scan. How is this treated? Treatment for this condition depends on the cause and whether it is short-term or long-term. If caused by a virus, your symptoms should go away on their own within 10 days. You may be given medicines to relieve symptoms. They include: Medicines that shrink swollen tissue in the nose. A spray that treats swelling of the nostrils. Rinses that help get rid of thick mucus in your nose (nasal saline washes). Medicines that treat allergies (antihistamines). Over-the-counter pain relievers. If caused by bacteria, your doctor may wait to see if you will get better without treatment. You may be given antibiotic medicine if you have: A very bad infection. A weak body defense system. If caused by growths in the nose, surgery may be needed. Follow these instructions at home: Medicines Take, use, or apply over-the-counter and prescription medicines only as told by your doctor. These may include nasal sprays. If you were prescribed an antibiotic medicine, take it as told by your doctor. Do not stop taking it even if you start to feel better. Hydrate and humidify  Drink enough water to keep your pee (urine) pale yellow. Use a cool mist humidifier to keep the humidity level in your home above 50%. Breathe in steam for 10-15 minutes, 3-4 times a day, or as told by your doctor. You can do this in the bathroom while a hot shower is running. Try not to spend time in cool or dry air. Rest Rest as much as you can. Sleep with your head raised (elevated). Make sure you get enough sleep each night. General instructions  Put a warm, moist washcloth on your face 3-4 times a day, or as often as told by your doctor. Use nasal saline washes as often as told by your  doctor. Wash your hands often with soap and water. If you cannot use soap and water, use hand sanitizer. Do not smoke. Avoid being around people who are smoking (secondhand smoke). Keep all follow-up visits. Contact a doctor if: You have a fever. Your symptoms get worse. Your symptoms do not get better within 10 days. Get help right away if: You have a very bad headache. You cannot stop vomiting. You have very bad pain or swelling around your face or eyes. You have trouble seeing. You feel confused. Your neck is stiff. You have trouble breathing. These symptoms may be an emergency. Get help right away. Call 911. Do not wait to see if the symptoms will go away. Do not drive yourself to the hospital. Summary A sinus infection is swelling of your sinuses. Sinuses are hollow spaces in the bones around your face. This condition is caused by tissues in your nose that become inflamed or swollen. This traps germs. These can lead to infection. If you were prescribed an antibiotic medicine, take it as told by your doctor. Do not stop taking it even if you start to feel better. Keep all follow-up visits. This information is not intended to replace advice given to you by your health care provider. Make sure you discuss any questions you have with your health care provider. Document Revised: 02/16/2021 Document Reviewed: 02/16/2021 Elsevier Patient Education  2024 Elsevier Inc.    Maryagnes Small, MD [facility]

## 2023-10-04 ENCOUNTER — Ambulatory Visit

## 2023-10-05 ENCOUNTER — Ambulatory Visit: Admitting: Internal Medicine

## 2023-10-05 ENCOUNTER — Encounter: Payer: Self-pay | Admitting: Obstetrics and Gynecology

## 2023-10-05 ENCOUNTER — Other Ambulatory Visit (HOSPITAL_COMMUNITY)
Admission: RE | Admit: 2023-10-05 | Discharge: 2023-10-05 | Disposition: A | Discharge status: Home / Self Care | Source: Ambulatory Visit | Attending: Obstetrics and Gynecology | Admitting: Obstetrics and Gynecology

## 2023-10-05 ENCOUNTER — Encounter: Payer: Self-pay | Admitting: Internal Medicine

## 2023-10-05 ENCOUNTER — Ambulatory Visit: Payer: Self-pay | Admitting: Obstetrics and Gynecology

## 2023-10-05 VITALS — BP 136/88 | HR 82 | Ht 62.0 in | Wt 178.8 lb

## 2023-10-05 VITALS — BP 110/80 | HR 86 | Temp 98.2°F | Ht 62.0 in | Wt 170.0 lb

## 2023-10-05 DIAGNOSIS — E559 Vitamin D deficiency, unspecified: Secondary | ICD-10-CM

## 2023-10-05 DIAGNOSIS — E538 Deficiency of other specified B group vitamins: Secondary | ICD-10-CM | POA: Diagnosis not present

## 2023-10-05 DIAGNOSIS — I129 Hypertensive chronic kidney disease with stage 1 through stage 4 chronic kidney disease, or unspecified chronic kidney disease: Secondary | ICD-10-CM | POA: Diagnosis not present

## 2023-10-05 DIAGNOSIS — F419 Anxiety disorder, unspecified: Secondary | ICD-10-CM

## 2023-10-05 DIAGNOSIS — R0981 Nasal congestion: Secondary | ICD-10-CM

## 2023-10-05 DIAGNOSIS — Z0000 Encounter for general adult medical examination without abnormal findings: Secondary | ICD-10-CM | POA: Diagnosis not present

## 2023-10-05 DIAGNOSIS — F17200 Nicotine dependence, unspecified, uncomplicated: Secondary | ICD-10-CM

## 2023-10-05 DIAGNOSIS — Z01419 Encounter for gynecological examination (general) (routine) without abnormal findings: Secondary | ICD-10-CM

## 2023-10-05 DIAGNOSIS — L299 Pruritus, unspecified: Secondary | ICD-10-CM

## 2023-10-05 DIAGNOSIS — Z1331 Encounter for screening for depression: Secondary | ICD-10-CM | POA: Diagnosis not present

## 2023-10-05 DIAGNOSIS — M8580 Other specified disorders of bone density and structure, unspecified site: Secondary | ICD-10-CM | POA: Diagnosis not present

## 2023-10-05 LAB — COMPREHENSIVE METABOLIC PANEL WITH GFR
ALT: 14 U/L (ref 0–35)
AST: 16 U/L (ref 0–37)
Albumin: 4.4 g/dL (ref 3.5–5.2)
Alkaline Phosphatase: 55 U/L (ref 39–117)
BUN: 26 mg/dL — ABNORMAL HIGH (ref 6–23)
CO2: 26 meq/L (ref 19–32)
Calcium: 9.7 mg/dL (ref 8.4–10.5)
Chloride: 104 meq/L (ref 96–112)
Creatinine, Ser: 1.41 mg/dL — ABNORMAL HIGH (ref 0.40–1.20)
GFR: 39.72 mL/min — ABNORMAL LOW (ref >60.00)
Glucose, Bld: 75 mg/dL (ref 70–99)
Potassium: 3.9 meq/L (ref 3.5–5.1)
Sodium: 137 meq/L (ref 135–145)
Total Bilirubin: 0.3 mg/dL (ref 0.2–1.2)
Total Protein: 8.2 g/dL (ref 6.0–8.3)

## 2023-10-05 LAB — URINALYSIS, ROUTINE W REFLEX MICROSCOPIC
Bilirubin Urine: NEGATIVE
Ketones, ur: NEGATIVE
Leukocytes,Ua: NEGATIVE
Nitrite: NEGATIVE
Specific Gravity, Urine: 1.025 (ref 1.000–1.030)
Total Protein, Urine: 100 — AB
Urine Glucose: NEGATIVE
Urobilinogen, UA: 0.2 (ref 0.0–1.0)
pH: 6 (ref 5.0–8.0)

## 2023-10-05 LAB — CBC WITH DIFFERENTIAL/PLATELET
Basophils Absolute: 0.1 K/uL (ref 0.0–0.1)
Basophils Relative: 0.9 % (ref 0.0–3.0)
Eosinophils Absolute: 0.4 K/uL (ref 0.0–0.7)
Eosinophils Relative: 3.1 % (ref 0.0–5.0)
HCT: 42.1 % (ref 36.0–46.0)
Hemoglobin: 14.1 g/dL (ref 12.0–15.0)
Lymphocytes Relative: 23.7 % (ref 12.0–46.0)
Lymphs Abs: 3.2 K/uL (ref 0.7–4.0)
MCHC: 33.5 g/dL (ref 30.0–36.0)
MCV: 85.3 fl (ref 78.0–100.0)
Monocytes Absolute: 0.7 K/uL (ref 0.1–1.0)
Monocytes Relative: 5.4 % (ref 3.0–12.0)
Neutro Abs: 8.9 K/uL — ABNORMAL HIGH (ref 1.4–7.7)
Neutrophils Relative %: 66.9 % (ref 43.0–77.0)
Platelets: 382 K/uL (ref 150.0–400.0)
RBC: 4.93 Mil/uL (ref 3.87–5.11)
RDW: 15.2 % (ref 11.5–15.5)
WBC: 13.4 K/uL — ABNORMAL HIGH (ref 4.0–10.5)

## 2023-10-05 LAB — TSH: TSH: 2.74 u[IU]/mL (ref 0.35–5.50)

## 2023-10-05 LAB — HEMOGLOBIN A1C: Hgb A1c MFr Bld: 6.1 % (ref 4.6–6.5)

## 2023-10-05 MED ORDER — FLUCONAZOLE 150 MG PO TABS: 150.0000 mg | ORAL_TABLET | Freq: Once | ORAL | 0 refills | Status: AC

## 2023-10-05 MED ORDER — ALPRAZOLAM 0.25 MG PO TABS: 0.2500 mg | ORAL_TABLET | Freq: Two times a day (BID) | ORAL | 2 refills | Status: DC | PRN

## 2023-10-05 MED ORDER — ESTRADIOL 0.1 MG/GM VA CREA: 1.0000 | TOPICAL_CREAM | Freq: Every day | VAGINAL | 0 refills | Status: AC

## 2023-10-05 MED ORDER — AMOXICILLIN-POT CLAVULANATE 875-125 MG PO TABS
1.0000 | ORAL_TABLET | Freq: Two times a day (BID) | ORAL | 0 refills | Status: DC
Start: 2023-10-05 — End: 2024-02-14

## 2023-10-05 MED ORDER — CYANOCOBALAMIN 1000 MCG/ML IJ SOLN
1000.0000 ug | Freq: Once | INTRAMUSCULAR | Status: AC
  Administered 2023-10-05: 1000 ug via INTRAMUSCULAR

## 2023-10-05 MED ORDER — METHYLPREDNISOLONE 4 MG PO TBPK
ORAL_TABLET | ORAL | 0 refills | Status: DC
Start: 2023-10-05 — End: 2024-02-14

## 2023-10-05 MED ORDER — LEVOTHYROXINE SODIUM 88 MCG PO TABS: 88.0000 ug | ORAL_TABLET | Freq: Every day | ORAL | 3 refills | Status: AC

## 2023-10-05 NOTE — Assessment & Plan Note (Signed)
 Discussed.

## 2023-10-05 NOTE — Assessment & Plan Note (Signed)
 Worse Medrol  pack Augmentin  x 2 wks Probiotic, Dflucan

## 2023-10-05 NOTE — Assessment & Plan Note (Signed)
CKD st 3 - f/u w/Dr Kruska Hydrate well 

## 2023-10-05 NOTE — Assessment & Plan Note (Signed)
 Xanax prn  Potential benefits of a long term benzodiazepines  use as well as potential risks  and complications were explained to the patient and were aknowledged.

## 2023-10-05 NOTE — Progress Notes (Signed)
 Subjective:  Patient ID: Vanessa [name], female    DOB: 12-Sep-1960  Age: 63 y.o. MRN: [id number]  CC: Annual Exam   HPI Vanessa [name] presents for a well exam C/o sinus congestion, drainage - worse, not feeling well x 2-3 wks. Pt got better after the abx then worse again...   Outpatient Medications Prior to Visit  Medication Sig Dispense Refill   Cyanocobalamin  (B-12 COMPLIANCE INJECTION IJ) Inject as directed. Every 30 days     Cyanocobalamin  (VITAMIN B-12) 5000 MCG SUBL Place under the tongue.     estradiol  (ESTRACE  VAGINAL) 0.1 MG/GM vaginal cream Place 1 Applicatorful vaginally at bedtime. Rub a dime size amount into the area of concern at bedtime until seen in 2-3 weeks 42.5 g 0   ALPRAZolam  (XANAX ) 0.25 MG tablet Take 1 tablet by mouth twice daily as needed 60 tablet 1   levothyroxine  (SYNTHROID ) 88 MCG tablet Take 1 tablet by mouth once daily 90 tablet 0   metFORMIN  (GLUCOPHAGE -XR) 500 MG 24 hr tablet Take 1 tablet (500 mg total) by mouth daily with breakfast. (Patient not taking: Reported on 10/05/2023) 30 tablet 11   pantoprazole  (PROTONIX ) 40 MG tablet Take 1 tablet (40 mg total) by mouth daily. (Patient not taking: Reported on 10/05/2023) 90 tablet 1   No facility-administered medications prior to visit.    ROS: Review of Systems  Constitutional:  Negative for activity change, appetite change, chills, fatigue and unexpected weight change.  HENT:  Negative for congestion, mouth sores and sinus pressure.   Eyes:  Negative for visual disturbance.  Respiratory:  Negative for cough and chest tightness.   Gastrointestinal:  Negative for abdominal pain and nausea.  Genitourinary:  Negative for difficulty urinating, frequency and vaginal pain.  Musculoskeletal:  Positive for arthralgias and back pain. Negative for gait problem.  Skin:  Negative for pallor and rash.  Neurological:  Negative for dizziness, tremors, weakness, numbness and headaches.  Psychiatric/Behavioral:   Negative for confusion, sleep disturbance and suicidal ideas. The patient is not nervous/anxious.     Objective:  BP 110/80   Pulse 86   Temp 98.2 F (36.8 C) (Oral)   Ht 5' 2 (1.575 m)   Wt 170 lb (77.1 kg)   LMP 03/08/2006   SpO2 96%   BMI 31.09 kg/m   BP Readings from Last 3 Encounters:  10/05/23 110/80  10/05/23 136/88  09/06/23 (!) 132/90    Wt Readings from Last 3 Encounters:  10/05/23 170 lb (77.1 kg)  10/05/23 178 lb 12.8 oz (81.1 kg)  09/06/23 179 lb (81.2 kg)    Physical Exam Constitutional:      General: She is not in acute distress.    Appearance: She is well-developed.  HENT:     Head: Normocephalic.     Right Ear: External ear normal.     Left Ear: External ear normal.     Nose: Nose normal.  Eyes:     General:        Right eye: No discharge.        Left eye: No discharge.     Conjunctiva/sclera: Conjunctivae normal.     Pupils: Pupils are equal, round, and reactive to light.  Neck:     Thyroid : No thyromegaly.     Vascular: No JVD.     Trachea: No tracheal deviation.  Cardiovascular:     Rate and Rhythm: Normal rate and regular rhythm.     Heart sounds: Normal heart sounds.  Pulmonary:  Effort: No respiratory distress.     Breath sounds: No stridor. No wheezing.  Abdominal:     General: Bowel sounds are normal. There is no distension.     Palpations: Abdomen is soft. There is no mass.     Tenderness: There is no abdominal tenderness. There is no guarding or rebound.  Musculoskeletal:        General: No tenderness.     Cervical back: Normal range of motion and neck supple. No rigidity.  Lymphadenopathy:     Cervical: No cervical adenopathy.  Skin:    Findings: No erythema or rash.  Neurological:     Cranial Nerves: No cranial nerve deficit.     Motor: No abnormal muscle tone.     Coordination: Coordination normal.     Deep Tendon Reflexes: Reflexes normal.  Psychiatric:        Behavior: Behavior normal.        Thought Content:  Thought content normal.        Judgment: Judgment normal.     Lab Results  Component Value Date   WBC 11.3 (H) 09/14/2021   HGB 13.8 09/14/2021   HCT 41.8 09/14/2021   PLT 306.0 09/14/2021   GLUCOSE 83 07/01/2022   CHOL 278 (H) 07/01/2022   TRIG 137.0 07/01/2022   HDL 54.00 07/01/2022   LDLDIRECT 199.0 10/15/2018   LDLCALC 196 (H) 07/01/2022   ALT 15 07/01/2022   AST 15 07/01/2022   NA 138 07/01/2022   K 4.0 07/01/2022   CL 104 07/01/2022   CREATININE 1.13 07/01/2022   BUN 25 (H) 07/01/2022   CO2 26 07/01/2022   TSH 4.79 07/01/2022   HGBA1C 6.0 07/01/2022    US  Abdomen Complete Result Date: 08/17/2023 CLINICAL DATA:  Abdominal pain. EXAM: ABDOMEN ULTRASOUND COMPLETE COMPARISON:  Renal ultrasound 04/08/2020 FINDINGS: Gallbladder: No gallstones or wall thickening visualized. No sonographic Murphy sign noted by sonographer. Common bile duct: Diameter: 3 mm, within normal limits Liver: The parenchyma is moderately echogenic. No discrete lesions are present. Portal vein is patent on color Doppler imaging with normal direction of blood flow towards the liver. IVC: No abnormality visualized. Pancreas: Visualized portion unremarkable. Spleen: Size and appearance within normal limits. Right Kidney: Length: 9.6 cm. The parenchyma is somewhat echogenic, isoechoic to the index organ, the liver. A 1.4 cm simple cyst is present in the interpolar region. Left Kidney: Length: 9.9 cm. The parenchyma is somewhat echogenic, isoechoic to the index organ, the spleen. No discrete lesions are present. No stone or obstruction is present. Abdominal aorta: No aneurysm visualized. Other findings: None. IMPRESSION: 1. No acute or focal lesion to explain the patient's symptoms. 2. Moderately echogenic liver parenchyma is nonspecific but most commonly seen with hepatic steatosis. 3. Somewhat echogenic renal parenchyma bilaterally is nonspecific but can be seen with medical renal disease. Electronically Signed   By:  Lonni Necessary M.D.   On: 08/17/2023 09:36    Assessment & Plan:   Problem List Items Addressed This Visit     B12 deficiency   Continue with vitamin B12 injections      Anxiety disorder - Primary   Xanax  prn  Potential benefits of a long term benzodiazepines  use as well as potential risks  and complications were explained to the patient and were aknowledged.       Relevant Medications   ALPRAZolam  (XANAX ) 0.25 MG tablet   Other Relevant Orders   Comprehensive metabolic panel with GFR   CBC with Differential/Platelet  Hemoglobin A1c   TSH   Urinalysis   RESOLVED: Well adult exam   We discussed age appropriate health related issues, including available/recomended screening tests and vaccinations. Labs were ordered to be later reviewed . All questions were answered. We discussed one or more of the following - seat belt use, use of sunscreen/sun exposure exercise, fall risk reduction, second hand smoke exposure, firearm use and storage, seat belt use, a need for adhering to healthy diet and exercise. Labs were ordered.  All questions were answered. Computed CT cardiac score tomography was offered Colon 2024      Current smoker   Discussed       Hypertension, renal disease, stage 1-4 or unspecified chronic kidney disease   CKD st 3 - f/u w/Dr Norine Cowper well      Relevant Orders   Comprehensive metabolic panel with GFR   CBC with Differential/Platelet   Hemoglobin A1c   TSH   Urinalysis   Vitamin D  deficiency   On Vit D      Sinus congestion   Worse Medrol  pack Augmentin  x 2 wks Probiotic, Dflucan          Meds ordered this encounter  Medications   cyanocobalamin  (VITAMIN B12) injection 1,000 mcg   amoxicillin -clavulanate (AUGMENTIN ) 875-125 MG tablet    Sig: Take 1 tablet by mouth 2 (two) times daily.    Dispense:  28 tablet    Refill:  0   methylPREDNISolone  (MEDROL  DOSEPAK) 4 MG TBPK tablet    Sig: As directed    Dispense:  21 tablet     Refill:  0   fluconazole  (DIFLUCAN ) 150 MG tablet    Sig: Take 1 tablet (150 mg total) by mouth once for 1 dose.    Dispense:  1 tablet    Refill:  0   ALPRAZolam  (XANAX ) 0.25 MG tablet    Sig: Take 1 tablet (0.25 mg total) by mouth 2 (two) times daily as needed.    Dispense:  60 tablet    Refill:  2   levothyroxine  (SYNTHROID ) 88 MCG tablet    Sig: Take 1 tablet (88 mcg total) by mouth daily.    Dispense:  90 tablet    Refill:  3      Follow-up: Return in about 6 months (around 04/06/2024) for a follow-up visit.  Marolyn Noel, MD

## 2023-10-05 NOTE — Progress Notes (Signed)
 63 y.o. y.o. female here for annual exam. Patient's last menstrual period was 03/08/2006.    G2P2L2  RP:  Established patient presenting for annual gyn exam   HPI: Postmenopausal, well on no HRT.  No significant hot flashes or night sweats. No vaginal bleeding.  No pelvic pain.  Pap Neg 03/2020.  Previous Paps all normal.  Breasts normal.  Mammo Neg 1/25. Colonoscopy 2014 and 2024, repeat at 10 yrs.  BD 08/2020 Osteopenia T-Score -1.4, Frax normal.  No fractures. Repeat ordered at [facility]. Vit D normal at 62 in 01/2021, continue 2000 IU of Vit D daily.  Increase fitness activities.  BMI 31.93.  Health labs with Fam MD. Body mass index is 32.7 kg/m.  Today reports off and on vaginal pruritus on the right side near the opening.     10/05/2023    9:19 AM 04/10/2023   10:11 AM 02/20/2023    4:11 PM  Depression screen PHQ 2/9  Decreased Interest 0 0 0  Down, Depressed, Hopeless 0 0 0  PHQ - 2 Score 0 0 0  Altered sleeping  0   Tired, decreased energy  0   Change in appetite  0   Feeling bad or failure about yourself   0   Trouble concentrating  0   Moving slowly or fidgety/restless  0   Suicidal thoughts  0   PHQ-9 Score  0   Difficult doing work/chores  Not difficult at all     Blood pressure 136/88, pulse 82, height 5' 2 (1.575 m), weight 178 lb 12.8 oz (81.1 kg), last menstrual period 03/08/2006.  No results found for: DIAGPAP, HPVHIGH, ADEQPAP  GYN HISTORY: No results found for: DIAGPAP, HPVHIGH, ADEQPAP  OB History  Gravida Para Term Preterm AB Living  2 2    2   SAB IAB Ectopic Multiple Live Births          # Outcome Date GA Lbr Len/2nd Weight Sex Type Anes PTL Lv  2 Para           1 Para             Past Medical History:  Diagnosis Date   Allergy    rhinitis   Anxiety    B12 deficiency    Cyst    gangeous- left hand   Decreased kidney fxn    Depression    Hyperlipidemia    Hypothyroidism    Hypothyroidism    Tobacco abuse    Vitamin  D deficiency 11/2017   Value 21    Past Surgical History:  Procedure Laterality Date   CESAREAN SECTION  1992   EYE SURGERY     GANGLION CYST EXCISION  2009   right ankle    Current Outpatient Medications on File Prior to Visit  Medication Sig Dispense Refill   ALPRAZolam  (XANAX ) 0.25 MG tablet Take 1 tablet by mouth twice daily as needed 60 tablet 1   Cyanocobalamin  (B-12 COMPLIANCE INJECTION IJ) Inject as directed. Every 30 days     Cyanocobalamin  (VITAMIN B-12) 5000 MCG SUBL Place under the tongue.     levothyroxine  (SYNTHROID ) 88 MCG tablet Take 1 tablet by mouth once daily 90 tablet 0   metFORMIN  (GLUCOPHAGE -XR) 500 MG 24 hr tablet Take 1 tablet (500 mg total) by mouth daily with breakfast. (Patient not taking: Reported on 10/05/2023) 30 tablet 11   pantoprazole  (PROTONIX ) 40 MG tablet Take 1 tablet (40 mg total) by mouth daily. (Patient not taking: Reported  on 10/05/2023) 90 tablet 1   No current facility-administered medications on file prior to visit.    Social History   Socioeconomic History   Marital status: Married    Spouse name: Not on file   Number of children: Not on file   Years of education: Not on file   Highest education level: Not on file  Occupational History   Occupation: Air traffic controller  Tobacco Use   Smoking status: Every Day    Current packs/day: 0.25    Types: Cigarettes   Smokeless tobacco: Never  Vaping Use   Vaping status: Never Used  Substance and Sexual Activity   Alcohol use: Not Currently   Drug use: Never   Sexual activity: Yes    Partners: Male    Birth control/protection: Post-menopausal    Comment: 1st intercourse 63 yo-Fewer than 5 partners  Other Topics Concern   Not on file  Social History Narrative   Not on file   Social Drivers of Health   Financial Resource Strain: Not on file  Food Insecurity: Not on file  Transportation Needs: Not on file  Physical Activity: Not on file  Stress: Not on file  Social Connections: Not on  file  Intimate Partner Violence: Not on file    Family History  Problem Relation Age of Onset   Thyroid  disease Mother        Low   Other Mother        B12 deficiency   Diabetes Mother    Hyperlipidemia Mother    Dementia Mother    Heart disease Father 68       CAD   Hyperlipidemia Father    Heart failure Father    Sudden death Father    Psoriasis Son    Coronary artery disease Other    Hyperlipidemia Other    Colon cancer Neg Hx    Colon polyps Neg Hx    Esophageal cancer Neg Hx    Rectal cancer Neg Hx    Stomach cancer Neg Hx      Allergies  Allergen Reactions   Atorvastatin     REACTION: aches   Crestor  [Rosuvastatin  Calcium ]     achy   Ezetimibe-Simvastatin     REACTION: hives   Hctz [Hydrochlorothiazide ]     incontinence   Hydrocodone  Bit-Homatrop Mbr Itching      Patient's last menstrual period was Patient's last menstrual period was 03/08/2006.[name]            Review of Systems Alls systems reviewed and are negative.     Physical Exam Constitutional:      Appearance: Normal appearance.  Genitourinary:     Vulva and urethral meatus normal.     No lesions in the vagina.     Right Labia: No rash, lesions or skin changes.    Left Labia: No lesions, skin changes or rash.    No vaginal discharge or tenderness.     No vaginal prolapse present.    No vaginal atrophy present.     Right Adnexa: not tender, not palpable and no mass present.    Left Adnexa: not tender, not palpable and no mass present.    No cervical motion tenderness or discharge.     Uterus is irregular.     Uterus is not enlarged or tender.  Breasts:    Right: Normal.     Left: Normal.  HENT:     Head: Normocephalic.  Neck:     Thyroid : No  thyroid  mass, thyromegaly or thyroid  tenderness.  Cardiovascular:     Rate and Rhythm: Normal rate and regular rhythm.     Heart sounds: Normal heart sounds, S1 normal and S2 normal.  Pulmonary:     Effort: Pulmonary effort is normal.      Breath sounds: Normal breath sounds and air entry.  Abdominal:     General: There is no distension.     Palpations: Abdomen is soft. There is no mass.     Tenderness: There is no abdominal tenderness. There is no guarding or rebound.  Musculoskeletal:        General: Normal range of motion.     Cervical back: Full passive range of motion without pain, normal range of motion and neck supple. No tenderness.     Right lower leg: No edema.     Left lower leg: No edema.  Neurological:     Mental Status: She is alert.  Skin:    General: Skin is warm.  Psychiatric:        Mood and Affect: Mood normal.        Behavior: Behavior normal.        Thought Content: Thought content normal.  Vitals and nursing note reviewed. Exam conducted with a chaperone present.       A:         Well Woman GYN exam Endometriosis Fibroids History of endometrioma DUB Osteopenia denies fractures                             P:        Pap smear collected today Encouraged annual mammogram screening Colon cancer screening Patient states she has referral DXA ordered today Labs and immunizations to do with PMD Discussed breast self exams Encouraged healthy lifestyle practices Encouraged Vit D and Calcium    No follow-ups on file.  Vanessa [name]

## 2023-10-05 NOTE — Assessment & Plan Note (Signed)
Continue with vitamin B12 injections

## 2023-10-05 NOTE — Assessment & Plan Note (Signed)
 We discussed age appropriate health related issues, including available/recomended screening tests and vaccinations. Labs were ordered to be later reviewed . All questions were answered. We discussed one or more of the following - seat belt use, use of sunscreen/sun exposure exercise, fall risk reduction, second hand smoke exposure, firearm use and storage, seat belt use, a need for adhering to healthy diet and exercise. Labs were ordered.  All questions were answered. Computed CT cardiac score tomography was offered Colon 2024

## 2023-10-05 NOTE — Patient Instructions (Signed)

## 2023-10-05 NOTE — Assessment & Plan Note (Signed)
 On Vit D

## 2023-10-06 ENCOUNTER — Ambulatory Visit: Payer: Self-pay | Admitting: Internal Medicine

## 2023-10-12 ENCOUNTER — Ambulatory Visit: Payer: Self-pay | Admitting: Obstetrics and Gynecology

## 2023-10-12 LAB — CYTOLOGY - PAP: Diagnosis: NEGATIVE

## 2023-11-02 ENCOUNTER — Ambulatory Visit

## 2023-11-02 ENCOUNTER — Encounter: Payer: Self-pay | Admitting: Family Medicine

## 2023-11-02 ENCOUNTER — Ambulatory Visit: Admitting: Family Medicine

## 2023-11-02 VITALS — BP 160/100 | HR 69 | Temp 97.7°F | Ht 62.0 in | Wt 184.8 lb

## 2023-11-02 DIAGNOSIS — I10 Essential (primary) hypertension: Secondary | ICD-10-CM

## 2023-11-02 DIAGNOSIS — E538 Deficiency of other specified B group vitamins: Secondary | ICD-10-CM | POA: Diagnosis not present

## 2023-11-02 DIAGNOSIS — R300 Dysuria: Secondary | ICD-10-CM | POA: Diagnosis not present

## 2023-11-02 LAB — POCT URINALYSIS DIP (CLINITEK)
Bilirubin, UA: NEGATIVE
Blood, UA: NEGATIVE
Glucose, UA: NEGATIVE mg/dL
Ketones, POC UA: NEGATIVE mg/dL
Leukocytes, UA: NEGATIVE
Nitrite, UA: NEGATIVE
POC PROTEIN,UA: NEGATIVE
Spec Grav, UA: <=1.005 — AB (ref 1.010–1.025)
Urobilinogen, UA: 0.2 U/dL — NL
pH, UA: 6 (ref 5.0–8.0)

## 2023-11-02 MED ORDER — CYANOCOBALAMIN 1000 MCG/ML IJ SOLN
1000.0000 ug | Freq: Once | INTRAMUSCULAR | Status: AC
  Administered 2023-11-02: 1000 ug via INTRAMUSCULAR

## 2023-11-02 NOTE — Patient Instructions (Addendum)
 Will give B 12 today  Pick up lisinopril today.  We will send your urine for culture today.  Check BP at home once daily in the mornings for the next month. Follow up with me in a month to recheck blood pressures.

## 2023-11-02 NOTE — Progress Notes (Signed)
 Acute Office Visit  Subjective:     Patient ID: Vanessa [name], female    DOB: December 26, 1960, 63 y.o.   MRN: [id number]  No chief complaint on file.   HPI  63 year old female presents for evaluation of lower abdominal pressure, mild low back pain. States she has been feeling tired and generally unwell for the last couple of days.  She is going out of town next week and wants to make sure that she does not have a UTI. Unsure if she is just tired because she is due for B12, inquiring about getting this today. States that nephrology has sent in lisinopril for her, she has not picked up this medication just yet. Denies headaches, vision changes, chest pain, palpitations, shortness of breath, other concerns.    ROS Per HPI      Objective:    BP (!) 160/100 (BP Location: Left Arm, Patient Position: Sitting, Cuff Size: Normal)   Pulse 69   Temp 97.7 F (36.5 C) (Temporal)   Ht 5' 2 (1.575 m)   Wt 184 lb 12.8 oz (83.8 kg)   LMP 03/08/2006   SpO2 96%   BMI 33.80 kg/m    Physical Exam Vitals and nursing note reviewed.  Constitutional:      General: She is not in acute distress.    Appearance: Normal appearance.  HENT:     Head: Normocephalic and atraumatic.     Right Ear: External ear normal.     Left Ear: External ear normal.  Eyes:     Extraocular Movements: Extraocular movements intact.     Pupils: Pupils are equal, round, and reactive to light.  Cardiovascular:     Rate and Rhythm: Normal rate and regular rhythm.     Pulses: Normal pulses.     Heart sounds: Normal heart sounds.  Pulmonary:     Effort: Pulmonary effort is normal. No respiratory distress.     Breath sounds: Normal breath sounds. No wheezing, rhonchi or rales.  Musculoskeletal:        General: Normal range of motion.     Cervical back: Normal range of motion.     Right lower leg: No edema.     Left lower leg: No edema.  Lymphadenopathy:     Cervical: No cervical adenopathy.  Neurological:      General: No focal deficit present.     Mental Status: She is alert and oriented to person, place, and time.  Psychiatric:        Mood and Affect: Mood normal.        Thought Content: Thought content normal.     Results for orders placed or performed in visit on 11/02/23  POCT URINALYSIS DIP (CLINITEK)  Result Value Ref Range   Color, UA yellow yellow   Clarity, UA clear clear   Glucose, UA negative negative mg/dL   Bilirubin, UA negative negative   Ketones, POC UA negative negative mg/dL   Spec Grav, UA <=8.994 (A) 1.010 - 1.025   Blood, UA negative negative   pH, UA 6.0 5.0 - 8.0   POC PROTEIN,UA negative negative, trace   Urobilinogen, UA 0.2 0.2 or 1.0 E.U./dL   Nitrite, UA Negative Negative   Leukocytes, UA Negative Negative        Assessment & Plan:   Vitamin B 12 deficiency -     Cyanocobalamin   Dysuria -     Urine Culture; Future -     POCT URINALYSIS DIP (CLINITEK)  Primary  hypertension  Pick up lisinopril and take this once daily. Monitor blood pressures at home once daily over the next month Follow-up in office for reevaluation of blood pressure     Orders Placed This Encounter  Procedures   Urine Culture    Standing Status:   Future    Expiration Date:   12/03/2023   POCT URINALYSIS DIP (CLINITEK)     Meds ordered this encounter  Medications   cyanocobalamin  (VITAMIN B12) injection 1,000 mcg    Return in about 4 weeks (around 11/30/2023).  Corean LITTIE Ku, FNP

## 2023-11-03 LAB — URINE CULTURE

## 2023-11-06 ENCOUNTER — Ambulatory Visit: Payer: Self-pay | Admitting: Family Medicine

## 2023-11-06 ENCOUNTER — Ambulatory Visit

## 2023-11-16 ENCOUNTER — Ambulatory Visit: Admitting: Obstetrics and Gynecology

## 2023-12-04 ENCOUNTER — Ambulatory Visit

## 2023-12-04 DIAGNOSIS — E538 Deficiency of other specified B group vitamins: Secondary | ICD-10-CM | POA: Diagnosis not present

## 2023-12-04 MED ORDER — CYANOCOBALAMIN 1000 MCG/ML IJ SOLN
1000.0000 ug | Freq: Once | INTRAMUSCULAR | Status: AC
  Administered 2023-12-04: 1000 ug via INTRAMUSCULAR

## 2023-12-04 NOTE — Progress Notes (Addendum)
 Pt here for monthly B12 injection per Dr. Harley  B12 1000mcg given IM and pt tolerated injection well.  Next B12 injection scheduled for Next Month.    Medical screening examination/treatment/procedure(s) were performed by non-physician practitioner and as supervising physician I was immediately available for consultation/collaboration.  I agree with above. Karlynn Noel, MD

## 2024-01-03 ENCOUNTER — Encounter: Payer: Self-pay | Admitting: Internal Medicine

## 2024-01-03 ENCOUNTER — Ambulatory Visit (INDEPENDENT_AMBULATORY_CARE_PROVIDER_SITE_OTHER)

## 2024-01-03 DIAGNOSIS — E538 Deficiency of other specified B group vitamins: Secondary | ICD-10-CM | POA: Diagnosis not present

## 2024-01-03 MED ORDER — CYANOCOBALAMIN 1000 MCG/ML IJ SOLN
1000.0000 ug | Freq: Once | INTRAMUSCULAR | Status: AC
  Administered 2024-01-03: 1000 ug via INTRAMUSCULAR

## 2024-01-03 NOTE — Progress Notes (Signed)
Pt was given B12 injection with no complications.

## 2024-01-30 ENCOUNTER — Other Ambulatory Visit: Payer: Self-pay | Admitting: Internal Medicine

## 2024-01-30 NOTE — Telephone Encounter (Signed)
 Copied from CRM #[id number]. Topic: Clinical - Medication Refill >> Jan 30, 2024  9:09 AM Vena HERO wrote: Medication: ALPRAZolam  (XANAX ) 0.25 MG tablet  Has the patient contacted their pharmacy? Yes (Agent: If no, request that the patient contact the pharmacy for the refill. If patient does not wish to contact the pharmacy document the reason why and proceed with request.) (Agent: If yes, when and what did the pharmacy advise?)  This is the patient's preferred pharmacy:  [facility] [address], [place] - [address] [place] [address] Phone: [phone] Fax: [phone]   Is this the correct pharmacy for this prescription? Yes If no, delete pharmacy and type the correct one.   Has the prescription been filled recently? No  Is the patient out of the medication? No  Has the patient been seen for an appointment in the last year OR does the patient have an upcoming appointment? Yes  Can we respond through MyChart? Yes  Agent: Please be advised that Rx refills may take up to 3 business days. We ask that you follow-up with your pharmacy.

## 2024-02-02 ENCOUNTER — Encounter: Payer: Self-pay | Admitting: Internal Medicine

## 2024-02-03 ENCOUNTER — Other Ambulatory Visit: Payer: Self-pay | Admitting: Internal Medicine

## 2024-02-05 ENCOUNTER — Ambulatory Visit

## 2024-02-05 ENCOUNTER — Ambulatory Visit (INDEPENDENT_AMBULATORY_CARE_PROVIDER_SITE_OTHER)

## 2024-02-05 DIAGNOSIS — E538 Deficiency of other specified B group vitamins: Secondary | ICD-10-CM | POA: Diagnosis not present

## 2024-02-05 MED ORDER — CYANOCOBALAMIN 1000 MCG/ML IJ SOLN
1000.0000 ug | Freq: Once | INTRAMUSCULAR | Status: AC
  Administered 2024-02-05: 1000 ug via INTRAMUSCULAR

## 2024-02-05 MED ORDER — ALPRAZOLAM 0.25 MG PO TABS: 0.2500 mg | ORAL_TABLET | Freq: Two times a day (BID) | ORAL | 2 refills | Status: AC | PRN

## 2024-02-05 NOTE — Progress Notes (Addendum)
 Patient visits today to receive her B12 injection/vaccine. Patient was informed and tolerated well. Patient was notified to reach out to [place] if needed.   Medical screening examination/treatment/procedure(s) were performed by non-physician practitioner and as supervising physician I was immediately available for consultation/collaboration.  I agree with above. Jacinta Shoe, MD

## 2024-02-12 ENCOUNTER — Telehealth: Payer: Self-pay | Admitting: Internal Medicine

## 2024-02-12 NOTE — Telephone Encounter (Signed)
 11/19 at 820 am appt made with Delon Failing PA for GERD symptoms that have worsened over the past few weeks.

## 2024-02-12 NOTE — Telephone Encounter (Signed)
 Inbound call from patient stating that she is having really bad GERD and would like to schedule an appointment. Patient was schedule for January the 6 th, patient is requesting a sooner appointment. Patient is also requesting a call back to advise her on what she should do in the mean time. Please advise.

## 2024-02-12 NOTE — Progress Notes (Unsigned)
 Chief Complaint: GERD  HPI:    Vanessa [name] is a 63 year old female, known to Dr. Avram, with a past medical history as listed below including anxiety, depression and multiple others, who was referred to me by Plotnikov, Vanessa GAILS, MD for a complaint of GERD.      09/08/2022 colonoscopy with 1 diminutive polyp in the descending colon.  Pathology showed hyperplastic and repeat recommended 10 years.    08/17/2023 abdominal ultrasound with moderately echogenic liver parenchyma likely hepatic steatosis.    10/05/2023 CBC with a white count of 13.4, neutrophils 8.9 and otherwise normal.  CMP with a BUN of 26, creatinine 1.41 and otherwise normal.  TSH normal.    02/12/2024 patient called and discussed really bad GERD.  She was made this appointment.  Past Medical History:  Diagnosis Date   Allergy    rhinitis   Anxiety    B12 deficiency    Cyst    gangeous- left hand   Decreased kidney fxn    Depression    Hyperlipidemia    Hypothyroidism    Hypothyroidism    Tobacco abuse    Vitamin D  deficiency 11/2017   Value 21    Past Surgical History:  Procedure Laterality Date   CESAREAN SECTION  1992   EYE SURGERY     GANGLION CYST EXCISION  2009   right ankle    Current Outpatient Medications  Medication Sig Dispense Refill   ALPRAZolam  (XANAX ) 0.25 MG tablet Take 1 tablet (0.25 mg total) by mouth 2 (two) times daily as needed for anxiety. 60 tablet 2   ALPRAZolam  (XANAX ) 0.25 MG tablet Take 1 tablet by mouth twice daily as needed 60 tablet 2   amoxicillin -clavulanate (AUGMENTIN ) 875-125 MG tablet Take 1 tablet by mouth 2 (two) times daily. 28 tablet 0   Cyanocobalamin  (B-12 COMPLIANCE INJECTION IJ) Inject as directed. Every 30 days     Cyanocobalamin  (VITAMIN B-12) 5000 MCG SUBL Place under the tongue.     estradiol  (ESTRACE  VAGINAL) 0.1 MG/GM vaginal cream Place 1 Applicatorful vaginally at bedtime. Rub a dime size amount into the area of concern at bedtime until seen in 2-3 weeks  42.5 g 0   levothyroxine  (SYNTHROID ) 88 MCG tablet Take 1 tablet (88 mcg total) by mouth daily. 90 tablet 3   methylPREDNISolone  (MEDROL  DOSEPAK) 4 MG TBPK tablet As directed 21 tablet 0   No current facility-administered medications for this visit.    Allergies as of 02/14/2024 - Review Complete 11/02/2023  Allergen Reaction Noted   Atorvastatin     Crestor  [rosuvastatin  calcium ]  08/09/2017   Ezetimibe-simvastatin     Hctz [hydrochlorothiazide ]  10/21/2020   Hydrocodone  bit-homatrop mbr Itching 04/27/2018    Family History  Problem Relation Age of Onset   Thyroid  disease Mother        Low   Other Mother        B12 deficiency   Diabetes Mother    Hyperlipidemia Mother    Dementia Mother    Heart disease Father 50       CAD   Hyperlipidemia Father    Heart failure Father    Sudden death Father    Psoriasis Son    Coronary artery disease Other    Hyperlipidemia Other    Colon cancer Neg Hx    Colon polyps Neg Hx    Esophageal cancer Neg Hx    Rectal cancer Neg Hx    Stomach cancer Neg Hx  Social History   Socioeconomic History   Marital status: Married    Spouse name: Not on file   Number of children: Not on file   Years of education: Not on file   Highest education level: Not on file  Occupational History   Occupation: Air traffic controller  Tobacco Use   Smoking status: Every Day    Current packs/day: 0.25    Types: Cigarettes   Smokeless tobacco: Never  Vaping Use   Vaping status: Never Used  Substance and Sexual Activity   Alcohol use: Not Currently   Drug use: Never   Sexual activity: Yes    Partners: Male    Birth control/protection: Post-menopausal    Comment: 1st intercourse 63 yo-Fewer than 5 partners  Other Topics Concern   Not on file  Social History Narrative   Not on file   Social Drivers of Health   Financial Resource Strain: Not on file  Food Insecurity: Not on file  Transportation Needs: Not on file  Physical Activity: Not on file   Stress: Not on file  Social Connections: Not on file  Intimate Partner Violence: Not on file    Review of Systems:    Constitutional: No weight loss, fever, chills, weakness or fatigue HEENT: Eyes: No change in vision               Ears, Nose, Throat:  No change in hearing or congestion Skin: No rash or itching Cardiovascular: No chest pain, chest pressure or palpitations   Respiratory: No SOB or cough Gastrointestinal: See HPI and otherwise negative Genitourinary: No dysuria or change in urinary frequency Neurological: No headache, dizziness or syncope Musculoskeletal: No new muscle or joint pain Hematologic: No bleeding or bruising Psychiatric: No history of depression or anxiety    Physical Exam:  Vital signs: LMP 03/08/2006   Constitutional:   Pleasant Caucasian female appears to be in NAD, Well developed, Well nourished, alert and cooperative Head:  Normocephalic and atraumatic. Eyes:   PEERL, EOMI. No icterus. Conjunctiva pink. Ears:  Normal auditory acuity. Neck:  Supple Throat: Oral cavity and pharynx without inflammation, swelling or lesion.  Respiratory: Respirations even and unlabored. Lungs clear to auscultation bilaterally.   No wheezes, crackles, or rhonchi.  Cardiovascular: Normal S1, S2. No MRG. Regular rate and rhythm. No peripheral edema, cyanosis or pallor.  Gastrointestinal:  Soft, nondistended, nontender. No rebound or guarding. Normal bowel sounds. No appreciable masses or hepatomegaly. Rectal:  Not performed.  Msk:  Symmetrical without gross deformities. Without edema, no deformity or joint abnormality.  Neurologic:  Alert and  oriented x4;  grossly normal neurologically.  Skin:   Dry and intact without significant lesions or rashes. Psychiatric: Oriented to person, place and time. Demonstrates good judgement and reason without abnormal affect or behaviors.  RELEVANT LABS AND IMAGING: CBC    Component Value Date/Time   WBC 13.4 (H) 10/05/2023 1457    RBC 4.93 10/05/2023 1457   HGB 14.1 10/05/2023 1457   HGB 14.7 08/13/2020 1005   HCT 42.1 10/05/2023 1457   HCT 45.0 08/13/2020 1005   PLT 382.0 10/05/2023 1457   MCV 85.3 10/05/2023 1457   MCV 87 08/13/2020 1005   MCH 28.5 08/13/2020 1005   MCH 29.0 10/21/2019 1621   MCHC 33.5 10/05/2023 1457   RDW 15.2 10/05/2023 1457   RDW 14.0 08/13/2020 1005   LYMPHSABS 3.2 10/05/2023 1457   LYMPHSABS 3.0 08/13/2020 1005   MONOABS 0.7 10/05/2023 1457   EOSABS 0.4 10/05/2023 1457  EOSABS 0.3 08/13/2020 1005   BASOSABS 0.1 10/05/2023 1457   BASOSABS 0.1 08/13/2020 1005    CMP     Component Value Date/Time   NA 137 10/05/2023 1457   NA 139 01/13/2022 1531   K 3.9 10/05/2023 1457   CL 104 10/05/2023 1457   CO2 26 10/05/2023 1457   GLUCOSE 75 10/05/2023 1457   GLUCOSE 104 (H) 04/07/2006 1357   BUN 26 (H) 10/05/2023 1457   BUN 27 (A) 01/13/2022 1531   CREATININE 1.41 (H) 10/05/2023 1457   CREATININE 1.13 (H) 10/21/2019 1616   CALCIUM  9.7 10/05/2023 1457   PROT 8.2 10/05/2023 1457   PROT 7.9 02/04/2021 0836   ALBUMIN 4.4 10/05/2023 1457   ALBUMIN 4.6 02/04/2021 0836   AST 16 10/05/2023 1457   ALT 14 10/05/2023 1457   ALKPHOS 55 10/05/2023 1457   BILITOT 0.3 10/05/2023 1457   BILITOT 0.3 02/04/2021 0836   GFRNONAA 53 (L) 10/21/2019 1616   GFRAA 62 10/21/2019 1616    Assessment: 1.  GERD: No prior EGD  Plan: 1. ***     Delon Failing, PA-C  Gastroenterology 02/12/2024, 3:45 PM  Cc: Plotnikov, Aleksei V, MD

## 2024-02-14 ENCOUNTER — Encounter: Payer: Self-pay | Admitting: Physician Assistant

## 2024-02-14 ENCOUNTER — Ambulatory Visit: Admitting: Physician Assistant

## 2024-02-14 VITALS — BP 128/90 | HR 86 | Ht 62.0 in | Wt 174.4 lb

## 2024-02-14 DIAGNOSIS — K219 Gastro-esophageal reflux disease without esophagitis: Secondary | ICD-10-CM | POA: Diagnosis not present

## 2024-02-14 DIAGNOSIS — K5900 Constipation, unspecified: Secondary | ICD-10-CM

## 2024-02-14 MED ORDER — PANTOPRAZOLE SODIUM 40 MG PO TBEC: 40.0000 mg | DELAYED_RELEASE_TABLET | Freq: Every day | ORAL | 2 refills | Status: DC

## 2024-02-14 NOTE — Patient Instructions (Signed)
 Continue Pantoprazole  40 mg.  Take 1 tablet 20-30 minutes before breakfast.  Start taking Miralax 1 capful (17 grams) 1x / day for 1 week.   If this is not effective, increase to 1 dose 2x / day for 1 week.   Can adjust dose as needed based on response. Can take 1/2 cap daily, skip days, or increase per day.    You have been scheduled for an endoscopy. Please follow written instructions given to you at your visit today.  If you use inhalers (even only as needed), please bring them with you on the day of your procedure.  If you take any of the following medications, they will need to be adjusted prior to your procedure:   DO NOT TAKE 7 DAYS PRIOR TO TEST- Trulicity (dulaglutide) Ozempic , Wegovy  (semaglutide ) Mounjaro, Zepbound (tirzepatide) Bydureon Bcise (exanatide extended release)  DO NOT TAKE 1 DAY PRIOR TO YOUR TEST Rybelsus  (semaglutide ) Adlyxin (lixisenatide) Victoza (liraglutide) Byetta (exanatide) ___________________________________________________________________________  Thank you for trusting me with your gastrointestinal care!  Delon Failing, PA-C   _______________________________________________________  If your blood pressure at your visit was 140/90 or greater, please contact your primary care physician to follow up on this.  _______________________________________________________  If you are age 63 or older, your body mass index should be between 23-30. Your Body mass index is 31.9 kg/m. If this is out of the aforementioned range listed, please consider follow up with your Primary Care Provider.  If you are age 46 or younger, your body mass index should be between 19-25. Your Body mass index is 31.9 kg/m. If this is out of the aformentioned range listed, please consider follow up with your Primary Care Provider.   ________________________________________________________  The [facility] providers would like to encourage you to use MYCHART to communicate  with providers for non-urgent requests or questions.  Due to long hold times on the telephone, sending your provider a message by [facility] may be a faster and more efficient way to get a response.  Please allow 48 business hours for a response.  Please remember that this is for non-urgent requests.  _______________________________________________________  [facility] is using a team-based approach to care.  Your team is made up of your doctor and two to three APPS. Our APPS (Nurse Practitioners and Physician Assistants) work with your physician to ensure care continuity for you. They are fully qualified to address your health concerns and develop a treatment plan. They communicate directly with your gastroenterologist to care for you. Seeing the Advanced Practice Practitioners on your physician's team can help you by facilitating care more promptly, often allowing for earlier appointments, access to diagnostic testing, procedures, and other specialty referrals.

## 2024-02-29 ENCOUNTER — Encounter: Admitting: Internal Medicine

## 2024-03-04 ENCOUNTER — Ambulatory Visit: Admitting: Internal Medicine

## 2024-03-04 ENCOUNTER — Encounter: Payer: Self-pay | Admitting: Internal Medicine

## 2024-03-04 VITALS — BP 97/62 | HR 68 | Temp 98.4°F | Resp 26 | Ht 62.0 in | Wt 174.0 lb

## 2024-03-04 DIAGNOSIS — K2980 Duodenitis without bleeding: Secondary | ICD-10-CM | POA: Diagnosis not present

## 2024-03-04 DIAGNOSIS — K31A11 Gastric intestinal metaplasia without dysplasia, involving the antrum: Secondary | ICD-10-CM | POA: Diagnosis not present

## 2024-03-04 DIAGNOSIS — K2970 Gastritis, unspecified, without bleeding: Secondary | ICD-10-CM

## 2024-03-04 DIAGNOSIS — K219 Gastro-esophageal reflux disease without esophagitis: Secondary | ICD-10-CM

## 2024-03-04 MED ORDER — SODIUM CHLORIDE 0.9 % IV SOLN: 500.0000 mL | INTRAVENOUS | Status: DC

## 2024-03-04 NOTE — Progress Notes (Signed)
 Report given to PACU, vss

## 2024-03-04 NOTE — Patient Instructions (Addendum)
 The esophagus looks great no signs of acid reflux damage.  Stomach lining shows signs of inflammation and so that is the first part of the intestine called the duodenum.  I took biopsies of these.  Once I get the results I will let you know.   Keep using the Pepcid (famotidine) as needed.  YOU HAD AN ENDOSCOPIC PROCEDURE TODAY AT THE [facility]:   Refer to the procedure report that was given to you for any specific questions about what was found during the examination.  If the procedure report does not answer your questions, please call your gastroenterologist to clarify.  If you requested that your care partner not be given the details of your procedure findings, then the procedure report has been included in a sealed envelope for you to review at your convenience later.  YOU SHOULD EXPECT: Some feelings of bloating in the abdomen. Passage of more gas than usual.  Walking can help get rid of the air that was put into your GI tract during the procedure and reduce the bloating. If you had a lower endoscopy (such as a colonoscopy or flexible sigmoidoscopy) you may notice spotting of blood in your stool or on the toilet paper. If you underwent a bowel prep for your procedure, you may not have a normal bowel movement for a few days.  Please Note:  You might notice some irritation and congestion in your nose or some drainage.  This is from the oxygen used during your procedure.  There is no need for concern and it should clear up in a day or so.  SYMPTOMS TO REPORT IMMEDIATELY:   Following upper endoscopy (EGD)  Vomiting of blood or coffee ground material  New chest pain or pain under the shoulder blades  Painful or persistently difficult swallowing  New shortness of breath  Fever of 100F or higher  Black, tarry-looking stools  For urgent or emergent issues, a gastroenterologist can be reached at any hour by calling [phone]. Do not use MyChart messaging for urgent  concerns.    DIET:  We do recommend a small meal at first, but then you may proceed to your regular diet.  Drink plenty of fluids but you should avoid alcoholic beverages for 24 hours.  ACTIVITY:  You should plan to take it easy for the rest of today and you should NOT DRIVE or use heavy machinery until tomorrow (because of the sedation medicines used during the test).    FOLLOW UP: Our staff will call the number listed on your records the next business day following your procedure.  We will call around 7:15- 8:00 am to check on you and address any questions or concerns that you may have regarding the information given to you following your procedure. If we do not reach you, we will leave a message.     If any biopsies were taken you will be contacted by phone or by letter within the next 1-3 weeks.  Please call us  at [phone] if you have not heard about the biopsies in 3 weeks.    SIGNATURES/CONFIDENTIALITY: You and/or your care partner have signed paperwork which will be entered into your electronic medical record.  These signatures attest to the fact that that the information above on your After Visit Summary has been reviewed and is understood.  Full responsibility of the confidentiality of this discharge information lies with you and/or your care-partner.

## 2024-03-04 NOTE — Progress Notes (Signed)
1403 Robinul 0.1 mg IV given due large amount of secretions upon assessment.  MD made aware, vss

## 2024-03-04 NOTE — Op Note (Signed)
 [facility] Patient Name: Vanessa [name] Procedure Date: 03/04/2024 2:02 PM MRN: [id number] Endoscopist: Lupita FORBES Commander , MD, [id number] Age: 63 Referring MD:  Date of Birth: [birth year] Gender: Female Account #: [id number] Procedure:                Upper GI endoscopy Indications:              Heartburn ? GERD Medicines:                Monitored Anesthesia Care Procedure:                Pre-Anesthesia Assessment:                           - Prior to the procedure, a History and Physical                            was performed, and patient medications and                            allergies were reviewed. The patient's tolerance of                            previous anesthesia was also reviewed. The risks                            and benefits of the procedure and the sedation                            options and risks were discussed with the patient.                            All questions were answered, and informed consent                            was obtained. Prior Anticoagulants: The patient has                            taken no anticoagulant or antiplatelet agents. ASA                            Grade Assessment: II - A patient with mild systemic                            disease. After reviewing the risks and benefits,                            the patient was deemed in satisfactory condition to                            undergo the procedure.                           After obtaining informed consent, the endoscope was  passed under direct vision. Throughout the                            procedure, the patient's blood pressure, pulse, and                            oxygen saturations were monitored continuously. The                            GIF [id number] #[id number] was introduced through the                            mouth, and advanced to the second part of duodenum.                            The upper GI endoscopy was  accomplished without                            difficulty. The patient tolerated the procedure                            well. Scope In: Scope Out: Findings:                 The examined esophagus was normal.                           Diffuse moderate inflammation characterized by                            congestion (edema), erosions, friability,                            granularity and nodularity was found in the entire                            examined stomach. Biopsies were taken with a cold                            forceps for histology. Verification of patient                            identification for the specimen was done. Estimated                            blood loss was minimal.                           Moderate inflammation characterized by congestion                            (edema), erythema and granularity was found in the                            duodenal bulb. Biopsies were taken with a cold  forceps for histology. Verification of patient                            identification for the specimen was done. Estimated                            blood loss was minimal.                           The cardia and gastric fundus were otherwise normal                            on retroflexion.                           The gastroesophageal flap valve was visualized                            endoscopically and classified as Hill Grade I                            (prominent fold, tight to endoscope).                           The exam was otherwise without abnormality. Complications:            No immediate complications. Estimated Blood Loss:     Estimated blood loss was minimal. Impression:               - Normal esophagus. No signs of GERD                           - Gastritis. Biopsied. Antrum and body, [place]                            protocol used                           - Duodenitis in bulb. Biopsied.                            - Gastroesophageal flap valve classified as Hill                            Grade I (prominent fold, tight to endoscope).                           - The examination was otherwise normal. Recommendation:           - Patient has a contact number available for                            emergencies. The signs and symptoms of potential                            delayed complications were discussed with the  patient. Return to normal activities tomorrow.                            Written discharge instructions were provided to the                            patient.                           - Resume previous diet.                           - Continue present medications.                           - Await pathology results. Lupita FORBES Commander, MD 03/04/2024 2:34:06 PM This report has been signed electronically.

## 2024-03-04 NOTE — Progress Notes (Signed)
 History and Physical Interval Note:  03/04/2024 2:01 PM  Vanessa [name]  has presented today for endoscopic procedure(s), with the diagnosis of  Encounter Diagnosis  Name Primary?   Gastroesophageal reflux disease, unspecified whether esophagitis present Yes  .  The various methods of evaluation and treatment have been discussed with the patient and/or family. After consideration of risks, benefits and other options for treatment, the patient has consented to  the endoscopic procedure(s).   The patient's history has been reviewed, patient examined, no change in status, stable for endoscopic procedure(s).  I have reviewed the patient's chart and labs.  Questions were answered to the patient's satisfaction.     Lupita CHARLENA Commander, MD, NOLIA

## 2024-03-05 ENCOUNTER — Telehealth: Payer: Self-pay | Admitting: *Deleted

## 2024-03-05 NOTE — Telephone Encounter (Signed)
  Follow up Call-     03/04/2024    1:35 PM 09/08/2022    8:46 AM  Call back number  Post procedure Call Back phone  # [phone]  Permission to leave phone message Yes Yes     Patient questions:  Do you have a fever, pain , or abdominal swelling? No. Pain Score  0 *  Have you tolerated food without any problems? Yes.    Have you been able to return to your normal activities? Yes.    Do you have any questions about your discharge instructions: Diet   No. Medications  No. Follow up visit  No.  Do you have questions or concerns about your Care? No.  Actions: * If pain score is 4 or above: No action needed, pain <4.

## 2024-03-06 ENCOUNTER — Ambulatory Visit

## 2024-03-06 DIAGNOSIS — E538 Deficiency of other specified B group vitamins: Secondary | ICD-10-CM | POA: Diagnosis not present

## 2024-03-06 MED ORDER — CYANOCOBALAMIN 1000 MCG/ML IJ SOLN
1000.0000 ug | Freq: Once | INTRAMUSCULAR | Status: AC
  Administered 2024-03-06: 1000 ug via INTRAMUSCULAR

## 2024-03-06 NOTE — Progress Notes (Addendum)
 Pt was given B12 injec w/o any complications.  Medical screening examination/treatment/procedure(s) were performed by non-physician practitioner and as supervising physician I was immediately available for consultation/collaboration.  I agree with above. Karlynn Noel, MD

## 2024-03-07 LAB — SURGICAL PATHOLOGY

## 2024-03-12 ENCOUNTER — Telehealth: Payer: Self-pay | Admitting: Internal Medicine

## 2024-03-12 NOTE — Telephone Encounter (Signed)
 Inbound call from patient stating that she would like to speak to follow up on her results from her procedure. Patient stated that she has not received anything in my chart. Patient is requesting a call back. Please advise.

## 2024-03-12 NOTE — Telephone Encounter (Signed)
 The pt has been advised that path has not been reviewed as of this afternoon.  She will be contacted as soon as able.

## 2024-03-15 ENCOUNTER — Other Ambulatory Visit: Payer: Self-pay

## 2024-03-15 ENCOUNTER — Ambulatory Visit: Payer: Self-pay | Admitting: Internal Medicine

## 2024-03-15 ENCOUNTER — Encounter: Payer: Self-pay | Admitting: Internal Medicine

## 2024-03-15 DIAGNOSIS — A048 Other specified bacterial intestinal infections: Secondary | ICD-10-CM

## 2024-03-15 DIAGNOSIS — B9681 Helicobacter pylori [H. pylori] as the cause of diseases classified elsewhere: Secondary | ICD-10-CM | POA: Insufficient documentation

## 2024-03-15 MED ORDER — DOXYCYCLINE HYCLATE 100 MG PO CAPS: 100.0000 mg | ORAL_CAPSULE | Freq: Two times a day (BID) | ORAL | 0 refills | Status: AC

## 2024-03-15 MED ORDER — OMEPRAZOLE 20 MG PO CPDR: 20.0000 mg | DELAYED_RELEASE_CAPSULE | Freq: Two times a day (BID) | ORAL | 0 refills | Status: DC

## 2024-03-15 MED ORDER — METRONIDAZOLE 250 MG PO TABS: 250.0000 mg | ORAL_TABLET | Freq: Four times a day (QID) | ORAL | 0 refills | Status: AC

## 2024-03-15 MED ORDER — BISMUTH SUBSALICYLATE 262 MG PO TABS: 2.0000 | ORAL_TABLET | Freq: Four times a day (QID) | ORAL | 0 refills | Status: AC

## 2024-03-15 NOTE — Telephone Encounter (Signed)
 Please let patient know she has H pylori gastritis  Please treat per our protocol  1) Omeprazole  20 mg 2 times a day x 14 d 2) Pepto Bismol 2 tabs (262 mg each) 4 times a day x 14 d 3) Metronidazole  250 mg 4 times a day x 14 d 4) doxycycline  100 mg 2 times a day x 14 d  After 14 d stop omeprazole  also  In 4 weeks after treatment completed do H. Pylori stool antigen - dx H. Pylori gastritis

## 2024-04-02 ENCOUNTER — Ambulatory Visit: Admitting: Internal Medicine

## 2024-04-02 ENCOUNTER — Encounter: Payer: Self-pay | Admitting: Internal Medicine

## 2024-04-02 VITALS — BP 110/76 | HR 82 | Ht 62.0 in | Wt 172.0 lb

## 2024-04-02 DIAGNOSIS — R198 Other specified symptoms and signs involving the digestive system and abdomen: Secondary | ICD-10-CM

## 2024-04-02 DIAGNOSIS — R1915 Other abnormal bowel sounds: Secondary | ICD-10-CM | POA: Diagnosis not present

## 2024-04-02 DIAGNOSIS — B9681 Helicobacter pylori [H. pylori] as the cause of diseases classified elsewhere: Secondary | ICD-10-CM | POA: Diagnosis not present

## 2024-04-02 DIAGNOSIS — K2970 Gastritis, unspecified, without bleeding: Secondary | ICD-10-CM

## 2024-04-02 NOTE — Progress Notes (Signed)
 [address]       Vanessa [name] 64 y.o. December 11, 1960 [id number]  Assessment & Plan:   Encounter Diagnoses  Name Primary?   Helicobacter pylori gastritis Yes   Borborygmi     Completed antibiotic regimen with symptom resolution. Chronic H. pylori infection increases gastric cancer and peptic ulcer risk, necessitating eradication confirmation. - Educated on eradication importance and risks of persistent infection. - Provided H. pylori information handout. - Instructed to obtain stool collection kit for H. pylori testing. - Advised stool specimen collection and return in early February, four weeks post-antibiotics.   Borborygmi Intermittent abdominal rumbling likely benign, indicating normal intestinal motility. - Reassured borborygmi are benign and reflect normal bowel function.        Subjective:   Chief Complaint: Follow-up of H. pylori gastritis  HPI 64 year old woman here for follow-up after she had an EGD to evaluate GERD, having symptoms despite daily PPI.  Was seen in the clinic on 02/14/2024 and then underwent EGD with biopsy 03/04/2024  - Normal esophagus. No signs of GERD - Gastritis. Biopsied. Antrum and body, [place] protocol used - Duodenitis in bulb. Biopsied. - Gastroesophageal flap valve classified as Hill Grade I (prominent fold, tight to endoscope). - The examination was otherwise normal.   1. Surgical [P], duodenal bulb biopsy :      ACUTE DUODENITIS WITH EXTENSIVE GASTRIC METAPLASIA CONSISTENT WITH PEPTIC      DUODENITIS       2. Surgical [P], gastric antrum :      MODERATE CHRONIC ACTIVE GASTRITIS WITH LYMPHOID AGGREGATES AND FOCAL INTESTINAL      METAPLASIA      HELICOBACTER PRESENT      NEGATIVE FOR DYSPLASIA AND CARCINOMA       3. Surgical [P], gastric body biopsy :      CHRONIC, FOCALLY ACTIVE GASTRITIS WITH LYMPHOID AGGREGATE      HELICOBACTER PRESENT      NEGATIVE FOR INTESTINAL METAPLASIA, DYSPLASIA AND CARCINOMA   H. pylori was treated as follows: 1)  Omeprazole  20 mg 2 times a day x 14 d 2) Pepto Bismol 2 tabs (262 mg each) 4 times a day x 14 d 3) Metronidazole  250 mg 4 times a day x 14 d 4) doxycycline  100 mg 2 times a day x 14 d     Allergies[1] Active Medications[2] Past Medical History:  Diagnosis Date   Allergy    rhinitis   Anxiety    B12 deficiency    Cyst    gangeous- left hand   Decreased kidney fxn    Depression    GERD (gastroesophageal reflux disease)    Helicobacter pylori gastritis 03/15/2024   Hyperlipidemia    Hypertension    Hypothyroidism    Hypothyroidism    Tobacco abuse    Vitamin D  deficiency 11/2017   Value 21   Past Surgical History:  Procedure Laterality Date   CESAREAN SECTION  1992   EYE SURGERY     GANGLION CYST EXCISION  2009   right ankle   Social History   Social History Narrative   Married   Smoker no alcohol no drug use   family history includes Coronary artery disease in an other family member; Dementia in her mother; Diabetes in her mother; Heart disease (age of onset: 61) in her father; Heart failure in her father; Hyperlipidemia in her father, mother, and another family member; Other in her mother; Psoriasis in her son; Sudden death in her father; Thyroid  disease in her mother.  Review of Systems As per HPI  Objective:   Physical Exam BP 110/76   Pulse 82   Ht 5' 2 (1.575 m)   Wt 172 lb (78 kg)   LMP 03/08/2006   BMI 31.46 kg/m      [1]  Allergies Allergen Reactions   Hctz [Hydrochlorothiazide ] Other (See Comments)    incontinence   Atorvastatin Other (See Comments)    REACTION: aches   Crestor  [Rosuvastatin  Calcium ]     achy   Ezetimibe-Simvastatin Other (See Comments)    REACTION: hives   Hydrocodone  Bit-Homatrop Mbr Itching  [2]  Current Meds  Medication Sig   ALPRAZolam  (XANAX ) 0.25 MG tablet Take 1 tablet (0.25 mg total) by mouth 2 (two) times daily as needed for anxiety.   Cyanocobalamin  (B-12 COMPLIANCE INJECTION IJ) Inject as directed.  Every 30 days   Cyanocobalamin  (VITAMIN B-12) 5000 MCG SUBL Place under the tongue.   estradiol  (ESTRACE  VAGINAL) 0.1 MG/GM vaginal cream Place 1 Applicatorful vaginally at bedtime. Rub a dime size amount into the area of concern at bedtime until seen in 2-3 weeks   famotidine (PEPCID) 10 MG tablet Take 10 mg by mouth as needed for heartburn or indigestion.   levothyroxine  (SYNTHROID ) 88 MCG tablet Take 1 tablet (88 mcg total) by mouth daily.   lisinopril (ZESTRIL) 5 MG tablet Take 5 mg by mouth daily.   [DISCONTINUED] omeprazole  (PRILOSEC) 20 MG capsule Take 1 capsule (20 mg total) by mouth 2 (two) times daily before a meal.   "

## 2024-04-02 NOTE — Patient Instructions (Signed)
 Glad you are doing better.   Please go to the lab and pick up your H. Pylori test kit. Bring the kit back the first week of February.   We have supplied you with a work note today.   I appreciate the opportunity to care for you. Lupita Commander, MD, [facility]

## 2024-04-04 LAB — HM MAMMOGRAPHY

## 2024-04-05 ENCOUNTER — Encounter: Payer: Self-pay | Admitting: Internal Medicine

## 2024-04-08 ENCOUNTER — Telehealth: Payer: Self-pay

## 2024-04-08 ENCOUNTER — Ambulatory Visit

## 2024-04-08 DIAGNOSIS — E538 Deficiency of other specified B group vitamins: Secondary | ICD-10-CM

## 2024-04-08 DIAGNOSIS — R928 Other abnormal and inconclusive findings on diagnostic imaging of breast: Secondary | ICD-10-CM

## 2024-04-08 MED ORDER — CYANOCOBALAMIN 1000 MCG/ML IJ SOLN
1000.0000 ug | Freq: Once | INTRAMUSCULAR | Status: AC
  Administered 2024-04-08: 1000 ug via INTRAMUSCULAR

## 2024-04-08 NOTE — Progress Notes (Addendum)
Pt was given B12 injection w/o any complications. Medical screening examination/treatment/procedure(s) were performed by non-physician practitioner and as supervising physician I was immediately available for consultation/collaboration.  I agree with above. Aleksei Plotnikov, MD  

## 2024-04-08 NOTE — Telephone Encounter (Signed)
 Copied from CRM [id number]. Topic: Clinical - Request for Lab/Test Order >> Apr 08, 2024  9:08 AM [facility] ORN wrote: Reason for CRM: Pt stated that she had a mammogram on Thursday at [facility] on [facility]. They told her that she needs PCP to order a diagnostic mammogram and ultrasound of her right breast. Pt is asking for this order to be placed as soon as possible because [facility] is booked up far out and she wants to get an appt as soon as possible.   Please call back to advise,  [phone]

## 2024-04-15 NOTE — Addendum Note (Signed)
 Addended by: Cass Vandermeulen V on: 04/15/2024 07:36 AM   Modules accepted: Orders

## 2024-04-15 NOTE — Telephone Encounter (Signed)
 Okay.  Thanks.

## 2024-04-19 ENCOUNTER — Encounter: Payer: Self-pay | Admitting: Internal Medicine

## 2024-04-19 ENCOUNTER — Telehealth: Payer: Self-pay

## 2024-04-19 MED ORDER — FLUCONAZOLE 150 MG PO TABS: 150.0000 mg | ORAL_TABLET | Freq: Once | ORAL | 0 refills | Status: AC

## 2024-04-19 NOTE — Telephone Encounter (Signed)
 Inbound call from patient stating that she is received a MyChart message about getting labs done. Patient was told she could come in on February the 2 nd and get stool kit and lab work. Patient would also like to speak to about her yeast infection. Please advise.

## 2024-04-19 NOTE — Telephone Encounter (Signed)
 Dr Glennon pt. Patient called asking for a rx for diflucan  to be called in. She said she had an endoscopy and they ended up putting her on two different antibiotics recently due to bacterial infection in her stomach. They gave her metronidazole  & doxycyline. She is having vaginal itching, vaginal irritation. She used monistat 7 days and it helped but has started back once she stopped it. She said she called the gastroenterologist for a rx but they told her she would need to call her gyn. Please advise.

## 2024-04-19 NOTE — Telephone Encounter (Signed)
 Patient notified and diflucan  sent to the pharmacy.

## 2024-04-19 NOTE — Telephone Encounter (Signed)
 Spoke to patient to advise that she may return stool studies in February as recommended by Dr Avram.   Patient states that she has a vaginal yeast infection and monistat is not helping. Request diflucan  ASAP. Advised that unfortunately, GI does not treat vaginal yeast infections and she should contact her PCP to discuss. She verbalizes understanding.

## 2024-04-19 NOTE — Telephone Encounter (Signed)
 Yes ok to send #1

## 2024-04-24 ENCOUNTER — Ambulatory Visit: Admitting: Nurse Practitioner

## 2024-05-03 ENCOUNTER — Other Ambulatory Visit

## 2024-05-03 DIAGNOSIS — A048 Other specified bacterial intestinal infections: Secondary | ICD-10-CM

## 2024-05-13 ENCOUNTER — Ambulatory Visit
